# Patient Record
Sex: Male | Born: 1985 | Race: Black or African American | Hispanic: No | Marital: Single | Smoking: Never smoker
Health system: Southern US, Community
[De-identification: ages and names within clinical notes are randomized; demographics above are authoritative.]

## PROBLEM LIST (undated history)

## (undated) DIAGNOSIS — E1142 Type 2 diabetes mellitus with diabetic polyneuropathy: Secondary | ICD-10-CM

## (undated) DIAGNOSIS — F329 Major depressive disorder, single episode, unspecified: Secondary | ICD-10-CM

## (undated) DIAGNOSIS — K259 Gastric ulcer, unspecified as acute or chronic, without hemorrhage or perforation: Secondary | ICD-10-CM

## (undated) DIAGNOSIS — K920 Hematemesis: Secondary | ICD-10-CM

## (undated) DIAGNOSIS — F4310 Post-traumatic stress disorder, unspecified: Secondary | ICD-10-CM

## (undated) DIAGNOSIS — F32A Depression, unspecified: Secondary | ICD-10-CM

## (undated) DIAGNOSIS — R809 Proteinuria, unspecified: Secondary | ICD-10-CM

## (undated) DIAGNOSIS — F419 Anxiety disorder, unspecified: Secondary | ICD-10-CM

## (undated) DIAGNOSIS — E109 Type 1 diabetes mellitus without complications: Secondary | ICD-10-CM

## (undated) HISTORY — DX: Anxiety disorder, unspecified: F41.9

## (undated) HISTORY — PX: APPENDECTOMY: SHX54

## (undated) HISTORY — DX: Post-traumatic stress disorder, unspecified: F43.10

## (undated) HISTORY — DX: Depression, unspecified: F32.A

## (undated) HISTORY — DX: Major depressive disorder, single episode, unspecified: F32.9

---

## 2011-09-28 ENCOUNTER — Emergency Department (HOSPITAL_COMMUNITY)
Admission: EM | Admit: 2011-09-28 | Discharge: 2011-09-29 | Discharge status: Against Medical Advice | Payer: Medicaid - Out of State | Attending: Emergency Medicine | Admitting: Emergency Medicine

## 2011-09-28 ENCOUNTER — Emergency Department (HOSPITAL_COMMUNITY)
Admission: EM | Admit: 2011-09-28 | Discharge: 2011-09-28 | Disposition: A | Discharge status: Home / Self Care | Payer: Medicaid - Out of State | Attending: Emergency Medicine | Admitting: Emergency Medicine

## 2011-09-28 ENCOUNTER — Encounter (HOSPITAL_COMMUNITY): Payer: Self-pay | Admitting: Emergency Medicine

## 2011-09-28 DIAGNOSIS — R1110 Vomiting, unspecified: Secondary | ICD-10-CM | POA: Insufficient documentation

## 2011-09-28 DIAGNOSIS — R1012 Left upper quadrant pain: Secondary | ICD-10-CM | POA: Insufficient documentation

## 2011-09-28 DIAGNOSIS — IMO0001 Reserved for inherently not codable concepts without codable children: Secondary | ICD-10-CM | POA: Insufficient documentation

## 2011-09-28 DIAGNOSIS — E119 Type 2 diabetes mellitus without complications: Secondary | ICD-10-CM | POA: Insufficient documentation

## 2011-09-28 DIAGNOSIS — R109 Unspecified abdominal pain: Secondary | ICD-10-CM | POA: Insufficient documentation

## 2011-09-28 HISTORY — DX: Proteinuria, unspecified: R80.9

## 2011-09-28 HISTORY — DX: Gastric ulcer, unspecified as acute or chronic, without hemorrhage or perforation: K25.9

## 2011-09-28 LAB — CBC WITH DIFFERENTIAL/PLATELET
Basophils Relative: 1 % (ref 0–1)
Eosinophils Absolute: 0.1 10*3/uL (ref 0.0–0.7)
Lymphs Abs: 1.6 10*3/uL (ref 0.7–4.0)
MCH: 26.2 pg (ref 26.0–34.0)
MCHC: 33 g/dL (ref 30.0–36.0)
Neutrophils Relative %: 63 % (ref 43–77)
Platelets: 225 10*3/uL (ref 150–400)
RBC: 4.43 MIL/uL (ref 4.22–5.81)

## 2011-09-28 LAB — URINALYSIS, ROUTINE W REFLEX MICROSCOPIC
Glucose, UA: 100 mg/dL — AB
Leukocytes, UA: NEGATIVE
pH: 5.5 (ref 5.0–8.0)

## 2011-09-28 LAB — COMPREHENSIVE METABOLIC PANEL
ALT: 9 U/L (ref 0–53)
AST: 14 U/L (ref 0–37)
Albumin: 3.8 g/dL (ref 3.5–5.2)
Alkaline Phosphatase: 99 U/L (ref 39–117)
Potassium: 4.2 mEq/L (ref 3.5–5.1)
Sodium: 137 mEq/L (ref 135–145)
Total Protein: 7.6 g/dL (ref 6.0–8.3)

## 2011-09-28 NOTE — ED Notes (Signed)
[facility] ED called and reported patient is in their waiting room.

## 2011-09-28 NOTE — ED Notes (Addendum)
Pt reports having pain all over; reports pain started today in RLQ of abd, vomited, and not reports that pain is radiating to entire body; pt reports one year history of DM, and reports having difficulty with sugar levels

## 2011-09-28 NOTE — ED Notes (Signed)
Pt left AMA °

## 2011-09-28 NOTE — ED Notes (Signed)
Pt alert, arrives from home, seen in [facility] ED today, left after triage, describes pain as Sharp, non radiating, resp even unlabored, skin pwd

## 2013-06-27 ENCOUNTER — Emergency Department (HOSPITAL_COMMUNITY): Payer: Medicaid - Out of State

## 2013-06-27 ENCOUNTER — Emergency Department (HOSPITAL_COMMUNITY)
Admission: EM | Admit: 2013-06-27 | Discharge: 2013-06-27 | Disposition: A | Discharge status: Home / Self Care | Payer: Self-pay | Attending: Emergency Medicine | Admitting: Emergency Medicine

## 2013-06-27 ENCOUNTER — Emergency Department (HOSPITAL_COMMUNITY): Payer: Self-pay

## 2013-06-27 ENCOUNTER — Encounter (HOSPITAL_COMMUNITY): Payer: Self-pay | Admitting: Emergency Medicine

## 2013-06-27 DIAGNOSIS — R112 Nausea with vomiting, unspecified: Secondary | ICD-10-CM | POA: Insufficient documentation

## 2013-06-27 DIAGNOSIS — E119 Type 2 diabetes mellitus without complications: Secondary | ICD-10-CM | POA: Insufficient documentation

## 2013-06-27 DIAGNOSIS — G589 Mononeuropathy, unspecified: Secondary | ICD-10-CM | POA: Insufficient documentation

## 2013-06-27 DIAGNOSIS — Z794 Long term (current) use of insulin: Secondary | ICD-10-CM | POA: Insufficient documentation

## 2013-06-27 DIAGNOSIS — Z79899 Other long term (current) drug therapy: Secondary | ICD-10-CM | POA: Insufficient documentation

## 2013-06-27 DIAGNOSIS — Z8719 Personal history of other diseases of the digestive system: Secondary | ICD-10-CM | POA: Insufficient documentation

## 2013-06-27 DIAGNOSIS — R109 Unspecified abdominal pain: Secondary | ICD-10-CM

## 2013-06-27 DIAGNOSIS — R1084 Generalized abdominal pain: Secondary | ICD-10-CM | POA: Insufficient documentation

## 2013-06-27 LAB — CBC
HCT: 37.1 % — ABNORMAL LOW (ref 39.0–52.0)
Hemoglobin: 12.2 g/dL — ABNORMAL LOW (ref 13.0–17.0)
MCH: 27.1 pg (ref 26.0–34.0)
MCHC: 32.9 g/dL (ref 30.0–36.0)
MCV: 82.4 fL (ref 78.0–100.0)
PLATELETS: 145 10*3/uL — AB (ref 150–400)
RBC: 4.5 MIL/uL (ref 4.22–5.81)
RDW: 13.1 % (ref 11.5–15.5)
WBC: 5 10*3/uL (ref 4.0–10.5)

## 2013-06-27 LAB — COMPREHENSIVE METABOLIC PANEL
ALBUMIN: 3.9 g/dL (ref 3.5–5.2)
ALT: 12 U/L (ref 0–53)
AST: 30 U/L (ref 0–37)
Alkaline Phosphatase: 85 U/L (ref 39–117)
BILIRUBIN TOTAL: 0.4 mg/dL (ref 0.3–1.2)
BUN: 15 mg/dL (ref 6–23)
CALCIUM: 8.9 mg/dL (ref 8.4–10.5)
CHLORIDE: 103 meq/L (ref 96–112)
CO2: 25 meq/L (ref 19–32)
CREATININE: 1.1 mg/dL (ref 0.50–1.35)
GFR calc Af Amer: >90 mL/min (ref >90)
GFR, EST NON AFRICAN AMERICAN: 89 mL/min — AB (ref >90)
Glucose, Bld: 128 mg/dL — ABNORMAL HIGH (ref 70–99)
Potassium: 3.8 mEq/L (ref 3.7–5.3)
Sodium: 140 mEq/L (ref 137–147)
Total Protein: 7.1 g/dL (ref 6.0–8.3)

## 2013-06-27 LAB — LIPASE, BLOOD: Lipase: 11 U/L (ref 11–59)

## 2013-06-27 MED ORDER — HYDROMORPHONE HCL PF 1 MG/ML IJ SOLN
1.0000 mg | Freq: Once | INTRAMUSCULAR | Status: AC
  Administered 2013-06-27: 1 mg via INTRAVENOUS
  Filled 2013-06-27: qty 1

## 2013-06-27 MED ORDER — HYDROCODONE-ACETAMINOPHEN 5-325 MG PO TABS: 1.0000 | ORAL_TABLET | ORAL | Status: DC | PRN

## 2013-06-27 MED ORDER — PROMETHAZINE HCL 25 MG PO TABS: 25.0000 mg | ORAL_TABLET | Freq: Four times a day (QID) | ORAL | Status: DC | PRN

## 2013-06-27 MED ORDER — IOHEXOL 300 MG/ML  SOLN
50.0000 mL | Freq: Once | INTRAMUSCULAR | Status: AC | PRN
  Administered 2013-06-27: 50 mL via ORAL

## 2013-06-27 MED ORDER — IOHEXOL 300 MG/ML  SOLN
100.0000 mL | Freq: Once | INTRAMUSCULAR | Status: AC | PRN
  Administered 2013-06-27: 100 mL via INTRAVENOUS

## 2013-06-27 MED ORDER — ONDANSETRON HCL 4 MG/2ML IJ SOLN
4.0000 mg | Freq: Once | INTRAMUSCULAR | Status: AC
  Administered 2013-06-27: 4 mg via INTRAVENOUS
  Filled 2013-06-27: qty 2

## 2013-06-27 NOTE — ED Notes (Signed)
Patient transported to CT 

## 2013-06-27 NOTE — Discharge Instructions (Signed)
Abdominal Pain, Adult °Many things can cause abdominal pain. Usually, abdominal pain is not caused by a disease and will improve without treatment. It can often be observed and treated at home. Your health care provider will do a physical exam and possibly order blood tests and X-rays to help determine the seriousness of your pain. However, in many cases, more time must pass before a clear cause of the pain can be found. Before that point, your health care provider may not know if you need more testing or further treatment. °HOME CARE INSTRUCTIONS  °Monitor your abdominal pain for any changes. The following actions may help to alleviate any discomfort you are experiencing: °· Only take over-the-counter or prescription medicines as directed by your health care provider. °· Do not take laxatives unless directed to do so by your health care provider. °· Try a clear liquid diet (broth, tea, or water) as directed by your health care provider. Slowly move to a bland diet as tolerated. °SEEK MEDICAL CARE IF: °· You have unexplained abdominal pain. °· You have abdominal pain associated with nausea or diarrhea. °· You have pain when you urinate or have a bowel movement. °· You experience abdominal pain that wakes you in the night. °· You have abdominal pain that is worsened or improved by eating food. °· You have abdominal pain that is worsened with eating fatty foods. °SEEK IMMEDIATE MEDICAL CARE IF:  °· Your pain does not go away within 2 hours. °· You have a fever. °· You keep throwing up (vomiting). °· Your pain is felt only in portions of the abdomen, such as the right side or the left lower portion of the abdomen. °· You pass bloody or black tarry stools. °MAKE SURE YOU: °· Understand these instructions.   °· Will watch your condition.   °· Will get help right away if you are not doing well or get worse.   °Document Released: 10/20/2004 Document Revised: 10/31/2012 Document Reviewed: 09/19/2012 °ExitCare® Patient  Information ©2014 [employer]. ° °

## 2013-06-27 NOTE — ED Provider Notes (Signed)
CSN: [id number]     Arrival date & time 06/27/13  0315 History   First MD Initiated Contact with Patient 06/27/13 0324     Chief Complaint  Patient presents with  . Abdominal Pain  . Emesis     The history is provided by the patient.   patient reports developing mild left-sided upper abdominal pain throughout the evening but then acutely worsened.  He has a history of gastric ulcers before in the past.  He reports one episode of nausea and vomiting.  He reports there was a tinge of blood in the vomit.  He denies diarrhea.  No fevers or chills.  His pain in his abdomen became suddenly worse when he was moving a piece of heavy equipment at work.  No flank pain.  No urinary complaints.  No testicular or scrotal complaints  Past Medical History  Diagnosis Date  . Diabetes mellitus   . Neuropathy   . Stomach ulcer   . Proteinuria    Past Surgical History  Procedure Laterality Date  . Appendectomy     No family history on file. History  Substance Use Topics  . Smoking status: Never Smoker   . Smokeless tobacco: Not on file  . Alcohol Use: No    Review of Systems  Gastrointestinal: Positive for vomiting and abdominal pain.  All other systems reviewed and are negative.     Allergies  Banana and Ibuprofen  Home Medications   Prior to Admission medications   Medication Sig Start Date End Date Taking? Authorizing Provider  gabapentin (NEURONTIN) 300 MG capsule Take 300 mg by mouth 3 (three) times daily.   Yes Historical Provider, MD  insulin glargine (LANTUS) 100 UNIT/ML injection Inject 20 Units into the skin at bedtime.    Yes Historical Provider, MD  traMADol (ULTRAM) 50 MG tablet Take 50 mg by mouth every 6 (six) hours as needed. For pain   Yes Historical Provider, MD  HYDROcodone-acetaminophen (NORCO/VICODIN) 5-325 MG per tablet Take 1 tablet by mouth every 4 (four) hours as needed for moderate pain. 06/27/13   Lyanne CoKevin M Joandry Slagter, MD  promethazine (PHENERGAN) 25 MG tablet Take 1  tablet (25 mg total) by mouth every 6 (six) hours as needed for nausea or vomiting. 06/27/13   Lyanne CoKevin M Takeela Peil, MD   BP 117/66  Pulse 83  Temp(Src) 98.2 F (36.8 C) (Oral)  Resp 14  SpO2 98% Physical Exam  Nursing note and vitals reviewed. Constitutional: He is oriented to person, place, and time. He appears well-developed and well-nourished.  HENT:  Head: Normocephalic and atraumatic.  Eyes: EOM are normal.  Neck: Normal range of motion.  Cardiovascular: Normal rate, regular rhythm, normal heart sounds and intact distal pulses.   Pulmonary/Chest: Effort normal and breath sounds normal. No respiratory distress.  Abdominal: Soft. He exhibits no distension.  Abdominal tenderness with diffuse guarding on the left side of his abdomen  Musculoskeletal: Normal range of motion.  Neurological: He is alert and oriented to person, place, and time.  Skin: Skin is warm and dry.  Psychiatric: He has a normal mood and affect. Judgment normal.    ED Course  Procedures (including critical care time) Labs Review Labs Reviewed  CBC - Abnormal; Notable for the following:    Hemoglobin 12.2 (*)    HCT 37.1 (*)    Platelets 145 (*)    All other components within normal limits  COMPREHENSIVE METABOLIC PANEL - Abnormal; Notable for the following:    Glucose, Bld  128 (*)    GFR calc non Af Amer 89 (*)    All other components within normal limits  LIPASE, BLOOD    Imaging Review Ct Abdomen Pelvis W Contrast  06/27/2013   CLINICAL DATA:  Left upper quadrant pain, emesis.  EXAM: CT ABDOMEN AND PELVIS WITH CONTRAST  TECHNIQUE: Multidetector CT imaging of the abdomen and pelvis was performed using the standard protocol following bolus administration of intravenous contrast.  CONTRAST:  OMNIPAQUE IOHEXOL 300 MG/ML  SOLN  COMPARISON:  None.  FINDINGS: Mild subsegmental atelectasis seen dependently within the visualized lung bases.  The liver demonstrates a normal contrast enhanced appearance.  Gallbladder is decompressed but grossly normal. No biliary ductal dilatation. The spleen, adrenal glands, and pancreas demonstrate a normal contrast enhanced appearance.  Kidneys are equal size with symmetric enhancement. No nephrolithiasis, hydronephrosis, or focal enhancing renal mass.  And stomach is within normal limits.  Mildly prominent contrast filled loop of jejunum measuring up to 2.7 cm in caliber seen within the mid upper and left upper abdomen. There is mild circumferential wall thickening about this mildly dilated loop of bowel. Finding is indeterminate, and may represent underlying into right is. Distally, the small bowel is within normal limits. Suture material seen at the cecum, compatible with prior appendectomy. No other inflammatory changes seen about the bowels.  Bladder is unremarkable.  Prostate within normal limits.  No free air or fluid. No enlarged intra-abdominal pelvic lymph nodes. Normal intravascular enhancement seen throughout the abdomen and pelvis.  No acute osseous abnormality. No worrisome lytic or blastic osseous lesions.  IMPRESSION: 1. Mildly prominent contrast filled loop of jejunum with associated circumferential wall thickening within the left mid and upper abdomen. Finding is nonspecific, but may be related to enteritis. Clinical correlation recommended. 2. No other acute intra-abdominal or pelvic process identified.   Electronically Signed   By: Rise Mu M.D.   On: 06/27/2013 06:10   Dg Abd 2 Views  06/27/2013   CLINICAL DATA:  Abdominal pain and emesis.  EXAM: ABDOMEN - 2 VIEW  COMPARISON:  None.  FINDINGS: The bowel gas pattern is normal. There is no evidence of free air. No radio-opaque calculi or other significant radiographic abnormality is seen.  IMPRESSION: Negative.   Electronically Signed   By: Tiburcio Pea M.D.   On: 06/27/2013 04:10     EKG Interpretation None      MDM   Final diagnoses:  Abdominal pain    Patient feels better at  this time.  Discharge home in good condition.  CT scan without acute pathology.  This may represent abdominal wall discomfort or pain.  No urinary complaints.  Patient be sent home with a short course of Vicodin and Phenergan    Lyanne Co, MD 06/27/13 [phone]

## 2013-06-27 NOTE — ED Notes (Signed)
Bed: WA17 Expected date: 06/27/13 Expected time: 3:04 AM Means of arrival: Ambulance Comments: RT SIDED PAIN

## 2013-06-27 NOTE — ED Notes (Signed)
Patient transported to X-ray 

## 2013-06-27 NOTE — ED Notes (Signed)
Patient states he was at work and had sudden sharp pain in his left upper quadrant of his abdomen that became more intense as he continued to work. He states he had an episode of bloody emesis. Patient states he has history of ulcers and has had bloody emesis in the past. He denies any changes in eating habits. He has sharp intermitttent pains as we are discussing his history.

## 2013-07-22 ENCOUNTER — Emergency Department (HOSPITAL_COMMUNITY)
Admission: EM | Admit: 2013-07-22 | Discharge: 2013-07-22 | Disposition: A | Discharge status: Home / Self Care | Payer: Medicaid - Out of State | Attending: Emergency Medicine | Admitting: Emergency Medicine

## 2013-07-22 ENCOUNTER — Encounter (HOSPITAL_COMMUNITY): Payer: Self-pay | Admitting: Emergency Medicine

## 2013-07-22 DIAGNOSIS — Z8669 Personal history of other diseases of the nervous system and sense organs: Secondary | ICD-10-CM | POA: Insufficient documentation

## 2013-07-22 DIAGNOSIS — S3121XA Laceration without foreign body of penis, initial encounter: Secondary | ICD-10-CM

## 2013-07-22 DIAGNOSIS — E119 Type 2 diabetes mellitus without complications: Secondary | ICD-10-CM | POA: Insufficient documentation

## 2013-07-22 DIAGNOSIS — X58XXXA Exposure to other specified factors, initial encounter: Secondary | ICD-10-CM | POA: Insufficient documentation

## 2013-07-22 DIAGNOSIS — K259 Gastric ulcer, unspecified as acute or chronic, without hemorrhage or perforation: Secondary | ICD-10-CM | POA: Insufficient documentation

## 2013-07-22 DIAGNOSIS — Y929 Unspecified place or not applicable: Secondary | ICD-10-CM | POA: Insufficient documentation

## 2013-07-22 DIAGNOSIS — Z794 Long term (current) use of insulin: Secondary | ICD-10-CM | POA: Insufficient documentation

## 2013-07-22 DIAGNOSIS — Y9389 Activity, other specified: Secondary | ICD-10-CM | POA: Insufficient documentation

## 2013-07-22 DIAGNOSIS — Z79899 Other long term (current) drug therapy: Secondary | ICD-10-CM | POA: Insufficient documentation

## 2013-07-22 DIAGNOSIS — S3120XA Unspecified open wound of penis, initial encounter: Secondary | ICD-10-CM | POA: Insufficient documentation

## 2013-07-22 LAB — CBG MONITORING, ED: GLUCOSE-CAPILLARY: 49 mg/dL — AB (ref 70–99)

## 2013-07-22 MED ORDER — LIDOCAINE HCL 2 % EX GEL
Freq: Once | CUTANEOUS | Status: DC
  Filled 2013-07-22: qty 10

## 2013-07-22 NOTE — ED Notes (Signed)
Pt states that the skin has torn away from the head of his penis, pt is not circumsized

## 2013-07-22 NOTE — ED Provider Notes (Signed)
CSN: [id number]     Arrival date & time 07/22/13  0544 History   First MD Initiated Contact with Patient 07/22/13 [phone]     Chief Complaint  Patient presents with  . Penis Injury     (Consider location/radiation/quality/duration/timing/severity/associated sxs/prior Treatment) HPI 28 year old male having intercourse this morning after intercourse noticed penile bleeding at the base of his glans at the 6:00 position at the junction of his foreskin and glans bleeding only partially controlled with local pressure there is no treatment prior to arrival otherwise, he is no lightheadedness no chest pain no shortness breath abdominal pain no vomiting no other concerns. There is no significant trauma, he is no testicular pain. This occurred just prior to arrival this morning. Tetanus vaccine UTD. Past Medical History  Diagnosis Date  . Diabetes mellitus   . Neuropathy   . Stomach ulcer   . Proteinuria    Past Surgical History  Procedure Laterality Date  . Appendectomy     History reviewed. No pertinent family history. History  Substance Use Topics  . Smoking status: Never Smoker   . Smokeless tobacco: Not on file  . Alcohol Use: No    Review of Systems 10 Systems reviewed and are negative for acute change except as noted in the HPI.   Allergies  Banana and Ibuprofen  Home Medications   Prior to Admission medications   Medication Sig Start Date End Date Taking? Authorizing Provider  gabapentin (NEURONTIN) 300 MG capsule Take 300 mg by mouth 3 (three) times daily.   Yes Historical Provider, MD  insulin glargine (LANTUS) 100 UNIT/ML injection Inject 20 Units into the skin at bedtime.    Yes Historical Provider, MD  pantoprazole (PROTONIX) 40 MG tablet Take 40 mg by mouth daily.   Yes Historical Provider, MD  PRESCRIPTION MEDICATION Take 1 tablet by mouth daily. Unknown anti depressant   Yes Historical Provider, MD  promethazine (PHENERGAN) 25 MG tablet Take 1 tablet (25 mg total) by  mouth every 6 (six) hours as needed for nausea or vomiting. 06/27/13  Yes Lyanne CoKevin M Campos, MD   BP 113/66  Pulse 76  Temp(Src) 98.3 F (36.8 C) (Oral)  Resp 16  Ht 5\' 6"  (1.676 m)  Wt 150 lb (68.04 kg)  BMI 24.22 kg/m2  SpO2 99% Physical Exam  Nursing note and vitals reviewed. Constitutional:  Awake, alert, nontoxic appearance.  HENT:  Head: Atraumatic.  Eyes: Right eye exhibits no discharge. Left eye exhibits no discharge.  Neck: Neck supple.  Cardiovascular: Normal rate and regular rhythm.   No murmur heard. Pulmonary/Chest: Effort normal and breath sounds normal. No respiratory distress. He has no wheezes. He has no rales. He exhibits no tenderness.  Abdominal: Soft. Bowel sounds are normal. He exhibits no distension. There is no tenderness. There is no rebound and no guarding.  Genitourinary:  Testicles nontender; uncircumcised male; base of the glans of the junction of the foreskin at the 6:00 position as approximately 5 mm x 5 mm skin tear with active bleeding partially controlled with local pressure; no rash and no discharge to penis/foreskin  Musculoskeletal: He exhibits no tenderness.  Baseline ROM, no obvious new focal weakness.  Neurological: He is alert.  Mental status and motor strength appears baseline for patient and situation.  Skin: No rash noted.  Psychiatric: He has a normal mood and affect.    ED Course  Procedures (including critical care time) QuickClot stopped bleeding.Pt feels improved after observation and/or treatment in ED.Patient / Family /  Caregiver informed of clinical course, understand medical decision-making process, and agree with plan. Labs Review Labs Reviewed  CBG MONITORING, ED - Abnormal; Notable for the following:    Glucose-Capillary 49 (*)    All other components within normal limits    Imaging Review No results found.   EKG Interpretation None      MDM   Final diagnoses:  Laceration of penis, initial encounter    I doubt  any other EMC precluding discharge at this time including, but not necessarily limited to the following:SBI.    Hurman HornJohn M Bednar, MD 07/24/13 2157

## 2013-07-22 NOTE — Discharge Instructions (Signed)
Non-Sutured Laceration A laceration is a cut or wound that goes through all layers of the skin and into the tissue just beneath the skin. Usually, these are stitched up or held together with tape or glue shortly after the injury occurred. However, if several or more hours have passed before getting care, too many germs (bacteria) get into the laceration. Stitching it closed would bring the risk of infection. If your health care provider feels your laceration is too old, it may be left open and then bandaged to allow healing from the bottom layer up. HOME CARE INSTRUCTIONS   Change the bandage (dressing) 2 times a day or as directed by your health care provider.  If the dressing or packing gauze sticks, soak it off with soapy water.  When you re-bandage your laceration, make sure that the dressing or packing gauze goes all the way to the bottom of the laceration. The top of the laceration is kept open so it can heal from the bottom up. There is less chance for infection with this method.  Wash the area with soap and water 2 times a day to remove all the creams or ointments, if used. Rinse off the soap. Pat the area dry with a clean towel. Look for signs of infection, such as redness, swelling, or a red line that goes away from the laceration.  Re-apply creams or ointments if they were used to bandage the laceration. This helps keep the bandage from sticking.  If the bandage becomes wet, dirty, or has a bad smell, change it as soon as possible.  Only take medicine as directed by your health care provider. You might need a tetanus shot now if:  You have no idea when you had the last one.  You have never had a tetanus shot before.  Your laceration had dirt in it.  Your laceration was dirty, and your last tetanus shot was more than 7 years ago.  Your laceration was clean, and your last tetanus shot was more than 10 years ago. If you need a tetanus shot, and you decide not to get one, there is  a rare chance of getting tetanus. Sickness from tetanus can be serious. If you got a tetanus shot, your arm may swell and get red and warm to the touch at the shot site. This is common and not a problem. SEEK MEDICAL CARE IF:   You have redness, swelling, or increasing pain in the laceration.  You notice a red line that goes away from your laceration.  You have pus coming from the laceration.  You have a fever.  You notice a bad smell coming from the laceration or dressing.  You notice something coming out of the laceration, such as wood or glass.  Your laceration is on your hand or foot and you are unable to properly move a finger or toe.  You have severe swelling around the laceration, causing pain and numbness.  You notice a change in color in your arm, hand, leg, or foot.  Return sooner also if you develop uncontrolled recurrent bleeding or other concerns. MAKE SURE YOU:   Understand these instructions.  Will watch your condition.  Will get help right away if you are not doing well or get worse. Document Released: 12/08/2005 Document Revised: 01/15/2013 Document Reviewed: 06/30/2008 [facility] Patient Information 2015 [employer], [place]. This information is not intended to replace advice given to you by your health care provider. Make sure you discuss any questions you have with  your health care provider. ° °

## 2013-10-27 ENCOUNTER — Encounter (HOSPITAL_COMMUNITY): Payer: Self-pay | Admitting: Emergency Medicine

## 2013-10-27 ENCOUNTER — Emergency Department (HOSPITAL_COMMUNITY)
Admission: EM | Admit: 2013-10-27 | Discharge: 2013-10-27 | Disposition: A | Discharge status: Home / Self Care | Payer: No Typology Code available for payment source | Attending: Emergency Medicine | Admitting: Emergency Medicine

## 2013-10-27 DIAGNOSIS — Z79899 Other long term (current) drug therapy: Secondary | ICD-10-CM | POA: Insufficient documentation

## 2013-10-27 DIAGNOSIS — F419 Anxiety disorder, unspecified: Secondary | ICD-10-CM | POA: Diagnosis present

## 2013-10-27 DIAGNOSIS — Z8739 Personal history of other diseases of the musculoskeletal system and connective tissue: Secondary | ICD-10-CM | POA: Diagnosis not present

## 2013-10-27 DIAGNOSIS — E119 Type 2 diabetes mellitus without complications: Secondary | ICD-10-CM | POA: Insufficient documentation

## 2013-10-27 DIAGNOSIS — Z794 Long term (current) use of insulin: Secondary | ICD-10-CM | POA: Diagnosis not present

## 2013-10-27 DIAGNOSIS — F410 Panic disorder [episodic paroxysmal anxiety] without agoraphobia: Secondary | ICD-10-CM

## 2013-10-27 DIAGNOSIS — Z8719 Personal history of other diseases of the digestive system: Secondary | ICD-10-CM | POA: Diagnosis not present

## 2013-10-27 MED ORDER — HYDROXYZINE HCL 25 MG PO TABS: 25.0000 mg | ORAL_TABLET | Freq: Three times a day (TID) | ORAL | Status: DC | PRN
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
## 2013-10-27 NOTE — Discharge Instructions (Signed)
Panic Attacks °Panic attacks are sudden, short-lived surges of severe anxiety, fear, or discomfort. They may occur for no reason when you are relaxed, when you are anxious, or when you are sleeping. Panic attacks may occur for a number of reasons:  °· Healthy people occasionally have panic attacks in extreme, life-threatening situations, such as war or natural disasters. Normal anxiety is a protective mechanism of the body that helps us react to danger (fight or flight response). °· Panic attacks are often seen with anxiety disorders, such as panic disorder, social anxiety disorder, generalized anxiety disorder, and phobias. Anxiety disorders cause excessive or uncontrollable anxiety. They may interfere with your relationships or other life activities. °· Panic attacks are sometimes seen with other mental illnesses, such as depression and posttraumatic stress disorder. °· Certain medical conditions, prescription medicines, and drugs of abuse can cause panic attacks. °SYMPTOMS  °Panic attacks start suddenly, peak within 20 minutes, and are accompanied by four or more of the following symptoms: °· Pounding heart or fast heart rate (palpitations). °· Sweating. °· Trembling or shaking. °· Shortness of breath or feeling smothered. °· Feeling choked. °· Chest pain or discomfort. °· Nausea or strange feeling in your stomach. °· Dizziness, light-headedness, or feeling like you will faint. °· Chills or hot flushes. °· Numbness or tingling in your lips or hands and feet. °· Feeling that things are not real or feeling that you are not yourself. °· Fear of losing control or going crazy. °· Fear of dying. °Some of these symptoms can mimic serious medical conditions. For example, you may think you are having a heart attack. Although panic attacks can be very scary, they are not life threatening. °DIAGNOSIS  °Panic attacks are diagnosed through an assessment by your health care provider. Your health care provider will ask  questions about your symptoms, such as where and when they occurred. Your health care provider will also ask about your medical history and use of alcohol and drugs, including prescription medicines. Your health care provider may order blood tests or other studies to rule out a serious medical condition. Your health care provider may refer you to a mental health professional for further evaluation. °TREATMENT  °· Most healthy people who have one or two panic attacks in an extreme, life-threatening situation will not require treatment. °· The treatment for panic attacks associated with anxiety disorders or other mental illness typically involves counseling with a mental health professional, medicine, or a combination of both. Your health care provider will help determine what treatment is best for you. °· Panic attacks due to physical illness usually go away with treatment of the illness. If prescription medicine is causing panic attacks, talk with your health care provider about stopping the medicine, decreasing the dose, or substituting another medicine. °· Panic attacks due to alcohol or drug abuse go away with abstinence. Some adults need professional help in order to stop drinking or using drugs. °HOME CARE INSTRUCTIONS  °· Take all medicines as directed by your health care provider.   °· Schedule and attend follow-up visits as directed by your health care provider. It is important to keep all your appointments. °SEEK MEDICAL CARE IF: °· You are not able to take your medicines as prescribed. °· Your symptoms do not improve or get worse. °SEEK IMMEDIATE MEDICAL CARE IF:  °· You experience panic attack symptoms that are different than your usual symptoms. °· You have serious thoughts about hurting yourself or others. °· You are taking medicine for panic attacks and   have a serious side effect. °MAKE SURE YOU: °· Understand these instructions. °· Will watch your condition. °· Will get help right away if you are not  doing well or get worse. °Document Released: 01/10/2005 Document Revised: 01/15/2013 Document Reviewed: 08/24/2012 °ExitCare® Patient Information ©2015 [employer]. This information is not intended to replace advice given to you by your health care provider. Make sure you discuss any questions you have with your health care provider. ° °Emergency Department Resource Guide °1) Find a Doctor and Pay Out of Pocket °Although you won't have to find out who is covered by your insurance plan, it is a good idea to ask around and get recommendations. You will then need to call the office and see if the doctor you have chosen will accept you as a new patient and what types of options they offer for patients who are self-pay. Some doctors offer discounts or will set up payment plans for their patients who do not have insurance, but you will need to ask so you aren't surprised when you get to your appointment. ° °2) Contact Your Local Health Department °Not all health departments have doctors that can see patients for sick visits, but many do, so it is worth a call to see if yours does. If you don't know where your local health department is, you can check in your phone book. The CDC also has a tool to help you locate your state's health department, and many state websites also have listings of all of their local health departments. ° °3) Find a Walk-in Clinic °If your illness is not likely to be very severe or complicated, you may want to try a walk in clinic. These are popping up all over the country in pharmacies, drugstores, and shopping centers. They're usually staffed by nurse practitioners or physician assistants that have been trained to treat common illnesses and complaints. They're usually fairly quick and inexpensive. However, if you have serious medical issues or chronic medical problems, these are probably not your best option. ° °No Primary Care Doctor: °- Call [facility] at  832-8000 - they can help you  locate a primary care doctor that  accepts your insurance, provides certain services, etc. °- Physician Referral Service- 1-800-533-3463 ° °Chronic Pain Problems: °Organization         Address  Phone   Notes  °[facility]  (336) 297-2271 Patients need to be referred by their primary care doctor.  ° °Medication Assistance: °Organization         Address  Phone   Notes  °[place] [facility] [address] °[place], [place] [address] (336) 641-8030 --Must be a resident of [place] °-- Must have NO insurance coverage whatsoever (no Medicaid/ Medicare, etc.) °-- The pt. MUST have a primary care doctor that directs their care regularly and follows them in the community °  °[facility]  (866) 331-1348   °[employer]  (888) 892-1162   ° °Agencies that provide inexpensive medical care: °Organization         Address  Phone   Notes  °[facility]  (336) 832-8035   °[facility]    (336) 832-7272   °[facility] [address] °[place], [place] [address] (336) 832-4777   °[facility] [address], °[place] (336) 271-4999   °[facility]    (336) 373-0678   °[facility]    (336) 272-1050   °  [facility] ° [address], [place] Phone:  (336) 832-4444, Fax:  (336) 832-4440 Hours of Operation:  9 am - 6 pm, M-F.  Also accepts Medicaid/Medicare and self-pay.  °[facility] ° [address], [place] Phone: (336) 832-3150, Fax: (336) 832-3151. Hours of Operation:  8:30 am - 5:30 pm, M-F.  Also accepts Medicaid and self-pay.  °[facility] [place] [address], [place] Phone: (336) 878-6027   °[facility] [address], [place], [place] (336)723-1848, Ext. 123 Mondays & Thursdays: 7-9 AM.  First 15 patients are seen on a first come, first serve basis. °  ° °Medicaid-accepting [place]  Providers: ° °Organization         Address  Phone   Notes  °[facility] [address], [place] (336) 641-2100 Also accepts self-pay patients.  °[facility] [address], [place] ° (336) 856-9996   °[facility] [address], [place] (336) 288-8857   °[facility] [address] [place] [address], [place] (336) 299-7000   °Veita Bland [address], [place]  ° (336) 373-1557 Only accepts [place] Medicaid patients after they have their name applied to their card.  ° °Self-Pay (no insurance) in [place]: ° °Organization         Address  Phone   Notes  °Sickle Cell Patients, [facility] [address], [place] (336) 832-1970   °[facility] [address], [place] (336) 832-4400   °[facility] ° [address], [place] (336) 992-4800   °[facility]/Dr. Osei-Bonsu ° [address] [place] [address], [place] or [address], [place] (336) 841-8500 Phone number for both [place] and [place] locations is the same.  °[facility] [address], [place] (336) 299-0000   °[facility] [address] [place] [address], [place] or [address] (336) 852-7530 °(336) 878-2260   °[facility] [address], [place] (336) 350-1642, phone; (336) 294-5005, fax Sees patients 1st and 3rd Saturday of every month.  Must not qualify for public or private insurance (i.e. Medicaid, Medicare, [place] Health Choice, Veterans' Benefits) • Household income should be no more than 200% of the poverty level •The clinic cannot treat you if you are pregnant or think you are pregnant • Sexually transmitted diseases are not treated at the clinic.  ° ° °Dental Care: °Organization         Address  Phone  Notes  °[facility] [address],  (336) 641-6152 Accepts children up to age 21 who are enrolled in Medicaid or [place] Health Choice; pregnant women with a Medicaid card; and children who have applied for Medicaid or [place] Health Choice, but were declined, whose parents can pay a reduced fee at time of service.  °[facility] [place]  [address], [place] (336) 641-7733 Accepts children up to age 21 who are enrolled in Medicaid or [place] Health Choice; pregnant women with a Medicaid card; and children who have applied for Medicaid or [place] Health Choice, but were declined, whose parents can pay a reduced fee at time of service.  °[facility] ° [address], [place] (336) 641-4533 Patients are seen by appointment only. Walk-ins are not accepted. [facility] will see patients 18 years of age and older. °Monday - Tuesday (8am-5pm) °Most Wednesdays (8:30-5pm) °$30 per visit, cash only  °[facility] ° [address], [place] (336) 641-4533 Patients are seen by appointment only. Walk-ins are not accepted. [facility] will see patients 18 years of age and older. °One Wednesday Evening (Monthly: Volunteer Based).  $30 per visit, cash only  °[facility]  (919) 537-3737 for adults; Children under age 4, call Graduate Pediatric Dentistry at (919) [phone]. Children aged 4-14, please call (919) [phone] to request a pediatric application. ° Dental services are provided in all areas of dental care including fillings, crowns and bridges, complete and partial dentures, implants, gum treatment, root canals, and extractions. Preventive care is also provided. Treatment is provided to both adults and children. °Patients are selected via a lottery and there is often a waiting list. °  °[facility] [address], °[place] ° (336) 763-8833 [email or web address] °  °[facility]  [address], [place], [place] (336)723-1848, Ext. 123 Second and Fourth Thursday of each month, opens at 6:30 AM; Clinic ends at 9 AM.  Patients are seen on a first-come first-served basis, and a limited number are seen during each clinic.  ° °[facility] ° [address], [place], [place] (336) 723-7904   Eligibility Requirements °You must have lived in [place], [place], or [place] counties for at least the last three months. °  You cannot be eligible for state or federal sponsored healthcare insurance, including Veterans Administration, Medicaid, or Medicare. °  You generally cannot be eligible for healthcare insurance through your employer.  °  How to apply: °Eligibility screenings are held every Tuesday and Wednesday afternoon from 1:00 pm until 4:00 pm. You do not need an appointment for the interview!  °[facility] [address], [place], [place] 336-631-2330   °[facility]  336-342-8273   °[facility]  336-703-3100   °[facility]  336-570-6415   ° °Behavioral Health Resources in the Community: °Intensive Outpatient Programs °Organization         Address  Phone  Notes  °[facility] [address], [place], [place] 336-878-6098   °[facility] [address], [place], [place] 336-832-9800   °[facility] [address], [place], [place] ° 336-882-2125   °[facility] [address],  °[place], [place] 1-800-853-5163 or 336-641-4981   °Substance Abuse Resources °Organization         Address  Phone  Notes  °[facility]  336-882-2125   °[facility]  336-784-9470   °[facility]  336-285-9073   °[facility]  336-845-3988   °Residential & Outpatient Substance Abuse Program  1-800-659-3381   °Psychological Services °Organization         Address  Phone  Notes  °[facility]  336- 832-9600     °[facility]  336- 378-7881   °[facility] [address], [place] 1-800-853-5163 or 336-641-4981   ° °Mobile Crisis Teams °Organization         Address  Phone  Notes  °[facility], Mobile Crisis Care Unit  1-877-626-1772   °Assertive °[facility] ° [address] , [place] 336-834-9664   °  Sharon DeEsch [address] °[place] 336-554-5454   ° °Self-Help/Support Groups °Organization         Address  Phone             Notes  °[facility] [place] [facility] - variety of support groups  336- 373-1402 Call for more information  °Narcotics Anonymous (NA), [facility] [address], °[place]  2 meetings at this location  ° °Residential Treatment Programs °Organization         Address  Phone  Notes  °[facility] [address],    °[place]  1-866-801-8205   °[facility] ° [address], [place], [place] 704-293-8524   °[facility] [address], [place] 336-845-3988 Admissions: 8am-3pm M-F  °[facility] [address]    °[place], [place] 336-841-1104   °[facility] [address], [place], [place] 336-379-7146   °[facility] [address]  °[place], [place] 336-285-9073   °[facility] Intensive Outpatient [address], [place], [place] 336-852-3033   °[facility] [address]  °[place], [place] 1-877-615-2722 or 336-784-9470   °[facility] [address] [place], [place] 336-227-7417 Accepts Medicaid  °[facility] [address]  °[place] 1-800-659-3381 Substance Abuse/Addiction Treatment  ° °[place] Behavioral Health Resources °Organization         Address  Phone  Notes  °[facility]  (888) 581-9988   °Julie Brannon, PhD [address] [place], [place]   (336) 349-5553 or (336) 951-0000    °[facility]   [address] °[place], [place] (336) 349-4454   °[facility] [address], [place], [place] (336) 342-8316 Insurance/Medicaid/sponsorship through [facility]  °[facility] [address]                                    [place], [place] (336) 342-8316 Therapy/tele-psych/case  °[facility] [address]  ° [place], [place] (336) 349-2233    °Dr. Arfeen  (336) 349-4544   °[facility] [place]  [employer] [place] [facility] 1) [address], [place] °2) [address], [place] °3)  [address], [place] (336) 349-3220 °(336) 342-7768 ° °(336) 342-8140   °[place] [facility] (336) 342-1394 or (336) 342-3537 (After Hours)    ° ° ° °
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
## 2013-10-27 NOTE — ED Notes (Signed)
Pt escorted to discharge window. Pt verbalized understanding discharge instructions. In no acute distress.  

## 2013-10-27 NOTE — ED Provider Notes (Signed)
Medical screening examination/treatment/procedure(s) were performed by non-physician practitioner and as supervising physician I was immediately available for consultation/collaboration.   EKG Interpretation None       [name], MD 10/27/13 2358

## 2013-10-30 ENCOUNTER — Ambulatory Visit: Payer: Self-pay | Admitting: Family

## 2013-10-30 DIAGNOSIS — Z0289 Encounter for other administrative examinations: Secondary | ICD-10-CM

## 2013-11-20 ENCOUNTER — Encounter: Payer: Self-pay | Admitting: Medical

## 2013-11-20 ENCOUNTER — Ambulatory Visit (INDEPENDENT_AMBULATORY_CARE_PROVIDER_SITE_OTHER): Payer: No Typology Code available for payment source | Admitting: Medical

## 2013-11-20 VITALS — BP 100/70 | HR 80 | Temp 98.1°F | Resp 16 | Wt 158.0 lb

## 2013-11-20 DIAGNOSIS — E0840 Diabetes mellitus due to underlying condition with diabetic neuropathy, unspecified: Secondary | ICD-10-CM

## 2013-11-20 DIAGNOSIS — E162 Hypoglycemia, unspecified: Secondary | ICD-10-CM

## 2013-11-20 DIAGNOSIS — Z23 Encounter for immunization: Secondary | ICD-10-CM

## 2013-11-20 DIAGNOSIS — H538 Other visual disturbances: Secondary | ICD-10-CM

## 2013-11-20 LAB — COMPREHENSIVE METABOLIC PANEL
ALK PHOS: 80 U/L (ref 39–117)
ALT: 11 U/L (ref 0–53)
AST: 21 U/L (ref 0–37)
Albumin: 4.3 g/dL (ref 3.5–5.2)
BILIRUBIN TOTAL: 0.5 mg/dL (ref 0.2–1.2)
BUN: 12 mg/dL (ref 6–23)
CO2: 28 mEq/L (ref 19–32)
Calcium: 9.5 mg/dL (ref 8.4–10.5)
Chloride: 99 mEq/L (ref 96–112)
Creat: 1.07 mg/dL (ref 0.50–1.35)
Glucose, Bld: 268 mg/dL — ABNORMAL HIGH (ref 70–99)
POTASSIUM: 4 meq/L (ref 3.5–5.3)
SODIUM: 135 meq/L (ref 135–145)
TOTAL PROTEIN: 7.4 g/dL (ref 6.0–8.3)

## 2013-11-20 LAB — LIPID PANEL
Cholesterol: 161 mg/dL (ref 0–200)
HDL: 37 mg/dL — ABNORMAL LOW (ref >39)
LDL Cholesterol: 85 mg/dL (ref 0–99)
Total CHOL/HDL Ratio: 4.4 Ratio
Triglycerides: 194 mg/dL — ABNORMAL HIGH (ref <150)
VLDL: 39 mg/dL (ref 0–40)

## 2013-11-20 LAB — CBC
HCT: 40.4 % (ref 39.0–52.0)
Hemoglobin: 12.8 g/dL — ABNORMAL LOW (ref 13.0–17.0)
MCH: 26.8 pg (ref 26.0–34.0)
MCHC: 31.7 g/dL (ref 30.0–36.0)
MCV: 84.7 fL (ref 78.0–100.0)
PLATELETS: 152 10*3/uL (ref 150–400)
RBC: 4.77 MIL/uL (ref 4.22–5.81)
RDW: 13.3 % (ref 11.5–15.5)
WBC: 7.3 10*3/uL (ref 4.0–10.5)

## 2013-11-20 LAB — T4, FREE: FREE T4: 1.09 ng/dL (ref 0.80–1.80)

## 2013-11-20 LAB — TSH: TSH: 0.848 u[IU]/mL (ref 0.350–4.500)

## 2013-11-20 LAB — POCT GLYCOSYLATED HEMOGLOBIN (HGB A1C): Hemoglobin A1C: 7.6

## 2013-11-20 NOTE — Addendum Note (Signed)
Addended by: Leretha DykesSCALES, Derric Dealmeida L on: 11/20/2013 12:51 PM   Modules accepted: Orders

## 2013-11-20 NOTE — Progress Notes (Signed)
Subjective:   Tyler [name] is a 28 y.o. male presenting on 11/20/2013 with new pt. type one DM  Here as a new patient today.  [place]    Here today to establishing care.  He has hx/o diabetes, migraines, PTSD, anxiety, depression, gastric ulcer, and vision problems.    Diabetes - diagnosed in 2011, not sure if Type 1 or 2.   He was hospitalized at onset of diagnosis, says he was only there few hours, started on meal-time insulin and Levemir.  Then was just put on Levemir at some point transitioned to Lantus. In general he takes Lantus 20 units nightly, eats 1-2 times daily.  He noticed his blood sugars run on average 150s after meals, 65-ish fasting at the start of his day, but gets lows under 60 often, possibly daily, lowest number has been in the 40s.  He keeps jellybeans or low readings. He notes horrible vision, has not seen an eye doctor but denies double vision or vision loss.    Was recently hospitalized for a gastric ulcer hematemesis. Had an EGD, put on medication, doing better now, compliant with Protonix.  Had ulcers since age 28yo. Hasn't seen GI in follow up.   PTSD, anxiety, Depressoin - seeing new office Thursday appt. has a caseworker- [facility], Occupational psychologistKathy Grumblatt.  He is busy throughout the day which urgent work so sometimes only eats 1-2 times daily. Has a history of migraines. He notes no double vision, no loss of vision no prior head scan.  He says he can sense vibes or intuitions with people according to his pastor.  He works in a factory  No other complaint.  Review of Systems ROS as in subjective      Objective:    Filed Vitals:   11/20/13 1129  BP: 100/70  Pulse: 80  Temp: 98.1 F (36.7 C)  Resp: 16    General appearance: alert, no distress, WD/WN, lean AA male Oral cavity: MMM, no lesions Neck: supple, no lymphadenopathy, no thyromegaly, no masses Heart: RRR, normal S1, S2, no murmurs Lungs: CTA  bilaterally, no wheezes, rhonchi, or rales Abdomen: +bs, soft, non tender, non distended, no masses, no hepatomegaly, no splenomegaly Pulses: 2+ symmetric, upper and lower extremities, normal cap refill Ext: no edema     Assessment: Encounter Diagnoses  Name Primary?  . Diabetes mellitus due to underlying condition with diabetic neuropathy Yes  . Hypoglycemia   . Blurred vision   . Flu vaccine need      Plan: Diabetes - labs today, follow-up pending labs, may need endocrinology consult  Hypoglycemia - discussed dangers of hypoglycemia, keeping liquid glucose on hand in the event of hypoglycemia  Blurred vision-will need to see an eye doctor  Counseled on the influenza virus vaccine.  Vaccine information sheet given.  Influenza vaccine given after consent obtained.   [name] was seen today for new pt. type one dm.  Diagnoses and associated orders for this visit:  Diabetes mellitus due to underlying condition with diabetic neuropathy - Comprehensive metabolic panel - CBC - Lipid panel - TSH - T4, Free - C-peptide - Insulin, random - Microalbumin / creatinine urine ratio  Hypoglycemia - Comprehensive metabolic panel - CBC - Lipid panel - TSH - T4, Free - C-peptide - Insulin, random - Microalbumin / creatinine urine ratio  Blurred vision - Comprehensive metabolic panel - CBC - Lipid panel - TSH - T4, Free - C-peptide - Insulin, random - Microalbumin /  creatinine urine ratio  Flu vaccine need - Flu Vaccine QUAD 36+ mos PF IM (Fluarix Quad PF) - Comprehensive metabolic panel - CBC - Lipid panel - TSH - T4, Free - C-peptide - Insulin, random - Microalbumin / creatinine urine ratio    Return pending labs.

## 2013-11-21 ENCOUNTER — Encounter (HOSPITAL_COMMUNITY): Payer: Self-pay | Admitting: Psychiatry

## 2013-11-21 ENCOUNTER — Ambulatory Visit (INDEPENDENT_AMBULATORY_CARE_PROVIDER_SITE_OTHER): Payer: PRIVATE HEALTH INSURANCE | Admitting: Psychiatry

## 2013-11-21 VITALS — BP 120/64 | HR 105 | Ht 66.0 in | Wt 154.6 lb

## 2013-11-21 DIAGNOSIS — F4310 Post-traumatic stress disorder, unspecified: Secondary | ICD-10-CM

## 2013-11-21 DIAGNOSIS — F331 Major depressive disorder, recurrent, moderate: Secondary | ICD-10-CM | POA: Insufficient documentation

## 2013-11-21 DIAGNOSIS — F6381 Intermittent explosive disorder: Secondary | ICD-10-CM

## 2013-11-21 DIAGNOSIS — F411 Generalized anxiety disorder: Secondary | ICD-10-CM | POA: Insufficient documentation

## 2013-11-21 LAB — C-PEPTIDE: C-Peptide: 0.62 ng/mL — ABNORMAL LOW (ref 0.80–3.90)

## 2013-11-21 LAB — INSULIN, RANDOM: Insulin: 15.4 u[IU]/mL (ref 2.0–19.6)

## 2013-11-21 LAB — MICROALBUMIN / CREATININE URINE RATIO
CREATININE, URINE: 267.5 mg/dL
Microalb Creat Ratio: 2.6 mg/g (ref 0.0–30.0)
Microalb, Ur: 0.7 mg/dL (ref <2.0)

## 2013-11-21 MED ORDER — SERTRALINE HCL 50 MG PO TABS: 25.0000 mg | ORAL_TABLET | Freq: Every day | ORAL | Status: DC

## 2013-11-21 MED ORDER — HYDROXYZINE HCL 25 MG PO TABS: 25.0000 mg | ORAL_TABLET | Freq: Three times a day (TID) | ORAL | Status: DC | PRN
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
## 2013-11-22 ENCOUNTER — Other Ambulatory Visit: Payer: Self-pay | Admitting: Medical

## 2013-11-22 DIAGNOSIS — E0821 Diabetes mellitus due to underlying condition with diabetic nephropathy: Secondary | ICD-10-CM

## 2013-12-04 ENCOUNTER — Encounter (HOSPITAL_COMMUNITY): Payer: Self-pay | Admitting: *Deleted

## 2013-12-04 ENCOUNTER — Emergency Department (HOSPITAL_COMMUNITY)
Admission: EM | Admit: 2013-12-04 | Discharge: 2013-12-04 | Disposition: A | Discharge status: Home / Self Care | Payer: No Typology Code available for payment source | Attending: Emergency Medicine | Admitting: Emergency Medicine

## 2013-12-04 ENCOUNTER — Emergency Department (HOSPITAL_COMMUNITY): Payer: No Typology Code available for payment source

## 2013-12-04 DIAGNOSIS — S6992XA Unspecified injury of left wrist, hand and finger(s), initial encounter: Secondary | ICD-10-CM | POA: Insufficient documentation

## 2013-12-04 DIAGNOSIS — Y9289 Other specified places as the place of occurrence of the external cause: Secondary | ICD-10-CM | POA: Diagnosis not present

## 2013-12-04 DIAGNOSIS — Y998 Other external cause status: Secondary | ICD-10-CM | POA: Insufficient documentation

## 2013-12-04 DIAGNOSIS — Y9389 Activity, other specified: Secondary | ICD-10-CM | POA: Diagnosis not present

## 2013-12-04 DIAGNOSIS — F4310 Post-traumatic stress disorder, unspecified: Secondary | ICD-10-CM | POA: Insufficient documentation

## 2013-12-04 DIAGNOSIS — Z794 Long term (current) use of insulin: Secondary | ICD-10-CM | POA: Insufficient documentation

## 2013-12-04 DIAGNOSIS — G629 Polyneuropathy, unspecified: Secondary | ICD-10-CM | POA: Diagnosis not present

## 2013-12-04 DIAGNOSIS — X58XXXA Exposure to other specified factors, initial encounter: Secondary | ICD-10-CM | POA: Diagnosis not present

## 2013-12-04 DIAGNOSIS — F419 Anxiety disorder, unspecified: Secondary | ICD-10-CM | POA: Insufficient documentation

## 2013-12-04 DIAGNOSIS — E119 Type 2 diabetes mellitus without complications: Secondary | ICD-10-CM | POA: Insufficient documentation

## 2013-12-04 DIAGNOSIS — F329 Major depressive disorder, single episode, unspecified: Secondary | ICD-10-CM | POA: Insufficient documentation

## 2013-12-04 DIAGNOSIS — Z8781 Personal history of (healed) traumatic fracture: Secondary | ICD-10-CM | POA: Diagnosis not present

## 2013-12-04 DIAGNOSIS — Z79899 Other long term (current) drug therapy: Secondary | ICD-10-CM | POA: Diagnosis not present

## 2013-12-04 DIAGNOSIS — M79643 Pain in unspecified hand: Secondary | ICD-10-CM

## 2013-12-04 NOTE — ED Notes (Signed)
The pt is c/o lt hand pain.  He was sitting on the floor last pm and used his hand to get up ans when he did his hand hurt
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
## 2013-12-04 NOTE — Discharge Instructions (Signed)
Please read and follow all provided instructions.  Your diagnoses today include:  1. Hand pain    Tests performed today include:  An x-ray of your wrist - does NOT show any broken bones  Vital signs. See below for your results today.   Medications prescribed:   Tylenol - use over-the-counter as directed on packaging  Take any prescribed medications only as directed.  Home care instructions:   Follow any educational materials contained in this packet  Wear your splint for at least one week or until seen by a physician for a follow-up examination.  Follow R.I.C.E. Protocol:  R - rest your injury   I  - use ice on injury without applying directly to skin  C - compress injury with bandage or splint  E - elevate the injury above the level of your heart as much as possible to reduce pain and swelling  Follow-up instructions: Please follow-up with your primary care provider or the provided orthopedic (bone specialist) if you continue to have significant pain or trouble using your wrist in 1 week. In this case you may have a severe injury that requires further care.   Return instructions:   Please return if your fingers are numb or tingling, appear very red, white, gray or blue, or you have severe pain (also elevate wrist and loosen splint or wrap)  Please return if you have difficulty moving your fingers.  Please return to the Emergency Department if you experience worsening symptoms.   Please return if you have any other emergent concerns.  Additional Information:  Your vital signs today were: BP 120/80 mmHg   Pulse 83   Temp(Src) 98.1 F (36.7 C)   Resp 16   Ht 5\' 6"  (1.676 m)   Wt 155 lb (70.308 kg)   BMI 25.03 kg/m2   SpO2 95% If your blood pressure (BP) was elevated above 135/85 this visit, please have this repeated by your doctor within one month. -------------- Wrist injuries are frequent in adults and children. A sprain is an injury to the ligaments that hold  your bones together. A strain is an injury to muscle or muscle tendons (cord like structure) from stretching or pulling.   Remember the importance of follow-up and possible follow-up x-rays. Improvement in pain level is not 100% insurance of not having a fracture.

## 2014-01-02 ENCOUNTER — Emergency Department (HOSPITAL_COMMUNITY): Payer: PRIVATE HEALTH INSURANCE

## 2014-01-02 ENCOUNTER — Encounter (HOSPITAL_COMMUNITY): Payer: Self-pay | Admitting: Family Medicine

## 2014-01-02 ENCOUNTER — Inpatient Hospital Stay (HOSPITAL_COMMUNITY)
Admission: EM | Admit: 2014-01-02 | Discharge: 2014-01-03 | DRG: 379 | Disposition: A | Discharge status: Home / Self Care | Payer: PRIVATE HEALTH INSURANCE | Attending: Family Medicine | Admitting: Family Medicine

## 2014-01-02 DIAGNOSIS — Z888 Allergy status to other drugs, medicaments and biological substances status: Secondary | ICD-10-CM | POA: Diagnosis not present

## 2014-01-02 DIAGNOSIS — G629 Polyneuropathy, unspecified: Secondary | ICD-10-CM | POA: Diagnosis present

## 2014-01-02 DIAGNOSIS — F4310 Post-traumatic stress disorder, unspecified: Secondary | ICD-10-CM | POA: Diagnosis present

## 2014-01-02 DIAGNOSIS — K274 Chronic or unspecified peptic ulcer, site unspecified, with hemorrhage: Principal | ICD-10-CM | POA: Diagnosis present

## 2014-01-02 DIAGNOSIS — K922 Gastrointestinal hemorrhage, unspecified: Secondary | ICD-10-CM | POA: Insufficient documentation

## 2014-01-02 DIAGNOSIS — K920 Hematemesis: Secondary | ICD-10-CM | POA: Diagnosis present

## 2014-01-02 DIAGNOSIS — R1084 Generalized abdominal pain: Secondary | ICD-10-CM | POA: Insufficient documentation

## 2014-01-02 DIAGNOSIS — F419 Anxiety disorder, unspecified: Secondary | ICD-10-CM | POA: Diagnosis present

## 2014-01-02 DIAGNOSIS — F329 Major depressive disorder, single episode, unspecified: Secondary | ICD-10-CM | POA: Diagnosis present

## 2014-01-02 DIAGNOSIS — E119 Type 2 diabetes mellitus without complications: Secondary | ICD-10-CM | POA: Diagnosis present

## 2014-01-02 DIAGNOSIS — R109 Unspecified abdominal pain: Secondary | ICD-10-CM

## 2014-01-02 DIAGNOSIS — F411 Generalized anxiety disorder: Secondary | ICD-10-CM

## 2014-01-02 HISTORY — DX: Type 2 diabetes mellitus with diabetic polyneuropathy: E11.42

## 2014-01-02 HISTORY — DX: Hematemesis: K92.0

## 2014-01-02 HISTORY — DX: Type 1 diabetes mellitus without complications: E10.9

## 2014-01-02 LAB — CBC WITH DIFFERENTIAL/PLATELET
Basophils Absolute: 0 10*3/uL (ref 0.0–0.1)
Basophils Relative: 0 % (ref 0–1)
EOS ABS: 0 10*3/uL (ref 0.0–0.7)
EOS PCT: 0 % (ref 0–5)
HCT: 37.7 % — ABNORMAL LOW (ref 39.0–52.0)
Hemoglobin: 12.6 g/dL — ABNORMAL LOW (ref 13.0–17.0)
LYMPHS ABS: 1 10*3/uL (ref 0.7–4.0)
Lymphocytes Relative: 22 % (ref 12–46)
MCH: 27.4 pg (ref 26.0–34.0)
MCHC: 33.4 g/dL (ref 30.0–36.0)
MCV: 82 fL (ref 78.0–100.0)
MONOS PCT: 8 % (ref 3–12)
Monocytes Absolute: 0.4 10*3/uL (ref 0.1–1.0)
Neutro Abs: 3.2 10*3/uL (ref 1.7–7.7)
Neutrophils Relative %: 70 % (ref 43–77)
PLATELETS: 152 10*3/uL (ref 150–400)
RBC: 4.6 MIL/uL (ref 4.22–5.81)
RDW: 12.8 % (ref 11.5–15.5)
WBC: 4.6 10*3/uL (ref 4.0–10.5)

## 2014-01-02 LAB — URINALYSIS, ROUTINE W REFLEX MICROSCOPIC
BILIRUBIN URINE: NEGATIVE
Glucose, UA: >1000 mg/dL — AB
Hgb urine dipstick: NEGATIVE
KETONES UR: NEGATIVE mg/dL
Leukocytes, UA: NEGATIVE
NITRITE: NEGATIVE
Protein, ur: NEGATIVE mg/dL
Specific Gravity, Urine: 1.024 (ref 1.005–1.030)
UROBILINOGEN UA: 1 mg/dL (ref 0.0–1.0)
pH: 6 (ref 5.0–8.0)

## 2014-01-02 LAB — CBC
HCT: 37.7 % — ABNORMAL LOW (ref 39.0–52.0)
Hemoglobin: 12.6 g/dL — ABNORMAL LOW (ref 13.0–17.0)
MCH: 26.8 pg (ref 26.0–34.0)
MCHC: 33.4 g/dL (ref 30.0–36.0)
MCV: 80 fL (ref 78.0–100.0)
PLATELETS: 143 10*3/uL — AB (ref 150–400)
RBC: 4.71 MIL/uL (ref 4.22–5.81)
RDW: 12.7 % (ref 11.5–15.5)
WBC: 4.9 10*3/uL (ref 4.0–10.5)

## 2014-01-02 LAB — COMPREHENSIVE METABOLIC PANEL
ALT: 11 U/L (ref 0–53)
ANION GAP: 14 (ref 5–15)
AST: 27 U/L (ref 0–37)
Albumin: 3.8 g/dL (ref 3.5–5.2)
Alkaline Phosphatase: 81 U/L (ref 39–117)
BUN: 19 mg/dL (ref 6–23)
CALCIUM: 9.1 mg/dL (ref 8.4–10.5)
CO2: 24 mEq/L (ref 19–32)
Chloride: 99 mEq/L (ref 96–112)
Creatinine, Ser: 1.07 mg/dL (ref 0.50–1.35)
GFR calc Af Amer: >90 mL/min (ref >90)
GFR calc non Af Amer: >90 mL/min (ref >90)
Glucose, Bld: 181 mg/dL — ABNORMAL HIGH (ref 70–99)
Potassium: 4.3 mEq/L (ref 3.7–5.3)
SODIUM: 137 meq/L (ref 137–147)
TOTAL PROTEIN: 7.3 g/dL (ref 6.0–8.3)
Total Bilirubin: 0.5 mg/dL (ref 0.3–1.2)

## 2014-01-02 LAB — TYPE AND SCREEN
ABO/RH(D): O POS
Antibody Screen: NEGATIVE

## 2014-01-02 LAB — URINE MICROSCOPIC-ADD ON

## 2014-01-02 LAB — GLUCOSE, CAPILLARY
Glucose-Capillary: 116 mg/dL — ABNORMAL HIGH (ref 70–99)
Glucose-Capillary: 75 mg/dL (ref 70–99)

## 2014-01-02 LAB — ABO/RH: ABO/RH(D): O POS

## 2014-01-02 LAB — LIPASE, BLOOD: Lipase: 12 U/L (ref 11–59)

## 2014-01-02 LAB — I-STAT TROPONIN, ED: TROPONIN I, POC: 0 ng/mL (ref 0.00–0.08)

## 2014-01-02 MED ORDER — IOHEXOL 300 MG/ML  SOLN: 25.0000 mL | Freq: Once | INTRAMUSCULAR | Status: DC | PRN

## 2014-01-02 MED ORDER — HYDROMORPHONE HCL 1 MG/ML IJ SOLN
1.0000 mg | Freq: Once | INTRAMUSCULAR | Status: AC
Start: 2014-01-02 — End: 2014-01-02
  Administered 2014-01-02: 1 mg via INTRAVENOUS
  Filled 2014-01-02: qty 1

## 2014-01-02 MED ORDER — SODIUM CHLORIDE 0.9 % IV SOLN: INTRAVENOUS | Status: DC

## 2014-01-02 MED ORDER — HYDROMORPHONE HCL 1 MG/ML IJ SOLN
0.5000 mg | INTRAMUSCULAR | Status: DC | PRN
Start: 2014-01-02 — End: 2014-01-03
  Administered 2014-01-03: 0.5 mg via INTRAVENOUS
  Filled 2014-01-02: qty 1

## 2014-01-02 MED ORDER — IOHEXOL 300 MG/ML  SOLN
80.0000 mL | Freq: Once | INTRAMUSCULAR | Status: AC | PRN
  Administered 2014-01-02: 80 mL via INTRAVENOUS

## 2014-01-02 MED ORDER — SODIUM CHLORIDE 0.9 % IV BOLUS (SEPSIS)
1000.0000 mL | Freq: Once | INTRAVENOUS | Status: AC
  Administered 2014-01-02: 1000 mL via INTRAVENOUS

## 2014-01-02 MED ORDER — SODIUM CHLORIDE 0.9 % IJ SOLN
3.0000 mL | Freq: Two times a day (BID) | INTRAMUSCULAR | Status: DC
  Administered 2014-01-02 – 2014-01-03 (×2): 3 mL via INTRAVENOUS

## 2014-01-02 MED ORDER — INSULIN GLARGINE 100 UNIT/ML ~~LOC~~ SOLN
8.0000 [IU] | Freq: Every day | SUBCUTANEOUS | Status: DC
  Administered 2014-01-02: 8 [IU] via SUBCUTANEOUS
  Filled 2014-01-02 (×2): qty 0.08

## 2014-01-02 MED ORDER — PANTOPRAZOLE SODIUM 40 MG IV SOLR
40.0000 mg | Freq: Two times a day (BID) | INTRAVENOUS | Status: DC
  Administered 2014-01-02 – 2014-01-03 (×2): 40 mg via INTRAVENOUS
  Filled 2014-01-02 (×3): qty 40

## 2014-01-02 MED ORDER — ONDANSETRON HCL 4 MG/2ML IJ SOLN
4.0000 mg | Freq: Once | INTRAMUSCULAR | Status: AC
  Administered 2014-01-02: 4 mg via INTRAVENOUS
  Filled 2014-01-02: qty 2

## 2014-01-02 MED ORDER — SODIUM CHLORIDE 0.9 % IV SOLN
INTRAVENOUS | Status: DC
Start: 2014-01-02 — End: 2014-01-02
  Administered 2014-01-02: 17:00:00 via INTRAVENOUS

## 2014-01-02 MED ORDER — INSULIN ASPART 100 UNIT/ML ~~LOC~~ SOLN: 0.0000 [IU] | Freq: Every day | SUBCUTANEOUS | Status: DC

## 2014-01-02 MED ORDER — INSULIN ASPART 100 UNIT/ML ~~LOC~~ SOLN: 0.0000 [IU] | SUBCUTANEOUS | Status: DC

## 2014-01-02 MED ORDER — FENTANYL CITRATE 0.05 MG/ML IJ SOLN
50.0000 ug | Freq: Once | INTRAMUSCULAR | Status: AC
  Administered 2014-01-02: 50 ug via INTRAVENOUS
  Filled 2014-01-02: qty 2

## 2014-01-02 MED ORDER — HYDROMORPHONE HCL 1 MG/ML IJ SOLN: 1.0000 mg | INTRAMUSCULAR | Status: DC | PRN

## 2014-01-02 MED ORDER — INSULIN ASPART 100 UNIT/ML ~~LOC~~ SOLN: 0.0000 [IU] | Freq: Three times a day (TID) | SUBCUTANEOUS | Status: DC

## 2014-01-02 MED ORDER — DEXTROSE-NACL 5-0.45 % IV SOLN
INTRAVENOUS | Status: DC
  Administered 2014-01-02: 23:00:00 via INTRAVENOUS

## 2014-01-02 MED ORDER — PANTOPRAZOLE SODIUM 40 MG IV SOLR
40.0000 mg | Freq: Once | INTRAVENOUS | Status: AC
  Administered 2014-01-02: 40 mg via INTRAVENOUS
  Filled 2014-01-02: qty 40

## 2014-01-02 MED ORDER — SODIUM CHLORIDE 0.9 % IV SOLN
INTRAVENOUS | Status: DC
  Administered 2014-01-02: 15:00:00 via INTRAVENOUS

## 2014-01-02 MED ORDER — LIDOCAINE-EPINEPHRINE 1 %-1:100000 IJ SOLN: 10.0000 mL | Freq: Once | INTRAMUSCULAR | Status: DC

## 2014-01-02 NOTE — H&P (Signed)
Family Medicine Teaching [facility] Admission History and Physical Service Pager: [phone]  Patient name: Tyler [name] Medical record number: [id number] Date of birth: Aug 21, 1985 Age: 28 y.o. Gender: male  Primary Care Provider: Crosby Oysteravid Tysinger, [facility] Consultants: GI in the morning Code Status: Full  Chief Complaint: Hematemesis  Assessment and Plan: Tyler [name] is a 28 y.o. male presenting with hematemesis . PMH is significant for history of GI bleed, T1DM, anxiety, depression, PTSD.   Hematemesis/Abdominal pain. Likely due to known peptic ulcer disease. Currently hemodyanamically stable. Hgb stable from one month ago. Presentation not consistent with Mallory-Weiss or Boerhaave Syndrome given lack of extensive vomiting. CT normal with no evidence for pancreatitis or hepatic/gall bladder pathology, and no free air making perforation unlikely. No lab evidence of infection, pancreatitis, or gall bladder pathology.  -GI consult in the morning -Obtain records from [facility] - will be highly useful to see what work up he's had previously -Protonix IV bid -zofran prn nausea, dilaudid prn pain -Serial H/H through the night -f/u Gastroccult -f/u FOBT -f/u AM CBC -monitor for worsening abdominal pain  T1DM. A1c 7.6. Home regimen: 15U lantus daily. -Half home lantus to 8U dose while NPO -SSI -monitor CBGs q4 hours while NPO  Anxiety/Depression/PTSD.  -Holding home gabapentin, hydroxyzine, and zoloft while NPO - restart when able to take PO  FEN/GI: NPO, NS@100cc /hr Prophylaxis: SCDs, no pharmacologic prophylaxis given GI bleed  Disposition: Admitted to telemetry under attending Dr Gwendolyn GrantWalden pending above management.   History of Present Illness: Tyler [name] is a 28 y.o. male presenting with hematemesis earlier today.   Patient states that the episode occurred around 11:00 am this morning after arguing with his fiancee. Says that he "felt a pop" in his  stomach then noticed significant upper abdominal pain and subsequently had 3 episodes of hematemesis. Pain is described as sharp and burning and radiates into his back. Patient reports that the vomitus was essentially pure blood with no food particles. Patient reports that he has had a few more episodes since, however has swallowed his vomitus. Has diarrhea at baseline with no frank hematochezia. Patient was in his normal state of health this morning prior to the episode. Patient denies any ingestions, NSAID use, or anticoagulant use. States this was not associated with eating.   Patient reports that he had a similar episode in September after a heated argument at work. He was managed [facility] and had an EGD performed, though the patient does not remember the exact results of the scope. He does mention possible ulcer and states he was told this was healed at that time. Patient has not followed up with a GI doctor since. Patient reports that he was diagnosed with ulcers when he was 13. Patient states that these ulcers were due to stress.  In the ED, the patient was given protonix, zofran, fentanyl, and dilaudid. Patient apparently presented with red blood in and around his mouth. Work up was remarkable for mild normocytic anemia (HgB 12.6). CT abdomen was performed which was unremarkable.   Review Of Systems: Per HPI, Otherwise 12 point review of systems was performed and was unremarkable.  Patient Active Problem List   Diagnosis Date Noted  . GI bleed 01/02/2014  . Intermittent explosive disorder 11/21/2013  . GAD (generalized anxiety disorder) 11/21/2013  . Major depressive disorder, recurrent episode, moderate 11/21/2013  . PTSD (post-traumatic stress disorder) 11/21/2013   Past Medical History: Past Medical History  Diagnosis Date  . Diabetes mellitus   .  Neuropathy   . Stomach ulcer   . Proteinuria   . Anxiety   . Depression   . PTSD (post-traumatic stress disorder)    Past  Surgical History: Past Surgical History  Procedure Laterality Date  . Appendectomy     Social History: History  Substance Use Topics  . Smoking status: Never Smoker   . Smokeless tobacco: Never Used  . Alcohol Use: No   Additional social history: N/a Please also refer to relevant sections of EMR.  Family History: Family History  Problem Relation Age of Onset  . HIV/AIDS Father   . Drug abuse Father   . Suicidality Neg Hx   . Bipolar disorder Neg Hx   . Depression Neg Hx   . Anxiety disorder Neg Hx   . Drug abuse Sister    Allergies and Medications: Allergies  Allergen Reactions  . Banana Anaphylaxis  . Ibuprofen Other (See Comments)    ulcers   No current facility-administered medications on file prior to encounter.   Current Outpatient Prescriptions on File Prior to Encounter  Medication Sig Dispense Refill  . gabapentin (NEURONTIN) 300 MG capsule Take 300 mg by mouth 3 (three) times daily.    . hydrOXYzine (ATARAX/VISTARIL) 25 MG tablet Take 1 tablet (25 mg total) by mouth every 8 (eight) hours as needed for anxiety. 90 tablet 1  . insulin glargine (LANTUS) 100 UNIT/ML injection Inject 15 Units into the skin at bedtime.     . pantoprazole (PROTONIX) 40 MG tablet Take 40 mg by mouth 2 (two) times daily.     . sertraline (ZOLOFT) 50 MG tablet Take 0.5 tablets (25 mg total) by mouth daily. 30 tablet 1    Objective: BP 132/66 mmHg  Pulse 74  Temp(Src) 98.5 F (36.9 C) (Oral)  Resp 11  SpO2 99% Exam: General: Ill appearing man lying in hospital bed HEENT: NCAT, PERRL, EOMI, MMM Cardiovascular: RRR, split s2, no murmurs Respiratory: NWOB, CTAB, no wheezes Abdomen: +BS, soft, diffusely tender to palpation most significantly in epigastric area and RUQ, nondistended, mild guarding, no rebound, negative Murphy's sign Extremities: No cyanosis or edema, distal pulses 2+ Skin: No obvious rashes or areas of skin breakdown Neuro: Drowsy, though alert and cooperative. No  gross focal neurologic deficits.   Labs and Imaging: CBC BMET   Recent Labs Lab 01/02/14 1226  WBC 4.6  HGB 12.6*  HCT 37.7*  PLT 152    Recent Labs Lab 01/02/14 1226  NA 137  K 4.3  CL 99  CO2 24  BUN 19  CREATININE 1.07  GLUCOSE 181*  CALCIUM 9.1     Urinalysis    Component Value Date/Time   COLORURINE YELLOW 01/02/2014 1340   APPEARANCEUR CLEAR 01/02/2014 1340   LABSPEC 1.024 01/02/2014 1340   PHURINE 6.0 01/02/2014 1340   GLUCOSEU >1000* 01/02/2014 1340   HGBUR NEGATIVE 01/02/2014 1340   BILIRUBINUR NEGATIVE 01/02/2014 1340   KETONESUR NEGATIVE 01/02/2014 1340   PROTEINUR NEGATIVE 01/02/2014 1340   UROBILINOGEN 1.0 01/02/2014 1340   NITRITE NEGATIVE 01/02/2014 1340   LEUKOCYTESUR NEGATIVE 01/02/2014 1340    Ct Abdomen Pelvis W Contrast  01/02/2014   CLINICAL DATA:  Hematemesis and abdomen pain CT starting today at 11 a.m. Patient has a history of diabetes and ulcers.  EXAM: CT ABDOMEN AND PELVIS WITH CONTRAST  TECHNIQUE: Multidetector CT imaging of the abdomen and pelvis was performed using the standard protocol following bolus administration of intravenous contrast.  CONTRAST:  80mL OMNIPAQUE IOHEXOL 300 MG/ML  SOLN  COMPARISON:  October 15, 2013  FINDINGS: The liver, spleen, pancreas, gallbladder, adrenal glands and kidneys are normal. There is no hydronephrosis bilaterally. The aorta is normal. There is no abdominal lymphadenopathy. There is no free air. There is no small bowel obstruction or diverticulitis. There is no inflammation around the stomach. The patient is status post prior appendectomy.  Images of the pelvis demonstrate mild diffuse thickened wall bladder unchanged. Pelvic phleboliths are identified. There is no pelvic lymphadenopathy. Mild dependent atelectasis of posterior lungs are noted. No acute abnormality is identified within the visualized bones.  IMPRESSION: No free air or inflammation surrounding the stomach. There is no bowel obstruction.    Electronically Signed   By: Sherian Rein M.D.   On: 01/02/2014 16:08    Jacquiline Doe, MD 01/02/2014, 5:11 PM PGY-1, [facility] Family Medicine FPTS Intern pager: [phone], text pages welcome  Upper Level Addendum:  I have seen and evaluated this patient along with Dr. Jimmey Ralph and reviewed the above note, making necessary revisions in red.   Marikay Alar, MD Family Medicine PGY-2

## 2014-01-02 NOTE — Progress Notes (Signed)
**  Interval Note**  RN paged.  Patient's CBG 75.  Patient is a T1DM here for hematemesis and is NPO.  Will change IVF to D5 1/2NS.  Advised RN to continue with 8 units of Lantus tonight (this is 1/2 of patient's normal regimen).  Will continue to monitor CBGs.  Asked Ms Larita FifeLynn, RN to check in 2 hours vs the scheduled q4.    Camauri Fleece M. Nadine CountsGottschalk, DO PGY-1, [facility] 01/02/14, 10:56pm

## 2014-01-02 NOTE — ED Provider Notes (Addendum)
CSN: [id number]     Arrival date & time 01/02/14  1154 History   First MD Initiated Contact with Patient 01/02/14 1214     Chief Complaint  Patient presents with  . Hematemesis  . Abdominal Pain     (Consider location/radiation/quality/duration/timing/severity/associated sxs/prior Treatment) Patient is a 28 y.o. male presenting with abdominal pain. The history is provided by the patient.  Abdominal Pain Associated symptoms: nausea and vomiting   Associated symptoms: no chest pain, no dysuria, no fever and no shortness of breath    patient with acute onset of right-sided abdominal pain and vomiting bright red blood 3 at 11:00 this morning. Patient had similar episode back in September and was admitted at [facility]. Did not require blood transfusion. Patient seems to relate to having an upper endoscopy done at that time. Is not able to give with the findings were. Patient's of bowel movement earlier today without evidence of any blood or black stools. Patient states abdominal pain is 10 out of 10. It's mostly right sided sharp in nature radiates to the back.  Past Medical History  Diagnosis Date  . Diabetes mellitus   . Neuropathy   . Stomach ulcer   . Proteinuria   . Anxiety   . Depression   . PTSD (post-traumatic stress disorder)    Past Surgical History  Procedure Laterality Date  . Appendectomy     Family History  Problem Relation Age of Onset  . HIV/AIDS Father   . Drug abuse Father   . Suicidality Neg Hx   . Bipolar disorder Neg Hx   . Depression Neg Hx   . Anxiety disorder Neg Hx   . Drug abuse Sister    History  Substance Use Topics  . Smoking status: Never Smoker   . Smokeless tobacco: Never Used  . Alcohol Use: No    Review of Systems  Constitutional: Negative for fever.  HENT: Negative for congestion.   Eyes: Negative for redness.  Respiratory: Negative for shortness of breath.   Cardiovascular: Negative for chest pain.  Gastrointestinal:  Positive for nausea, vomiting and abdominal pain. Negative for abdominal distention.  Genitourinary: Negative for dysuria.  Musculoskeletal: Positive for back pain.  Skin: Negative for rash.  Neurological: Negative for headaches.  Hematological: Does not bruise/bleed easily.  Psychiatric/Behavioral: Negative for confusion.      Allergies  Banana and Ibuprofen  Home Medications   Prior to Admission medications   Medication Sig Start Date End Date Taking? Authorizing Provider  gabapentin (NEURONTIN) 300 MG capsule Take 300 mg by mouth 3 (three) times daily.   Yes Historical Provider, MD  hydrOXYzine (ATARAX/VISTARIL) 25 MG tablet Take 1 tablet (25 mg total) by mouth every 8 (eight) hours as needed for anxiety. 11/21/13  Yes Oletta Darter, MD  insulin glargine (LANTUS) 100 UNIT/ML injection Inject 15 Units into the skin at bedtime.    Yes Historical Provider, MD  pantoprazole (PROTONIX) 40 MG tablet Take 40 mg by mouth 2 (two) times daily.    Yes Historical Provider, MD  sertraline (ZOLOFT) 50 MG tablet Take 0.5 tablets (25 mg total) by mouth daily. 11/21/13  Yes Oletta Darter, MD   BP 116/67 mmHg  Pulse 78  Temp(Src) 98.5 F (36.9 C) (Oral)  Resp 9  SpO2 97% Physical Exam  Constitutional: He is oriented to person, place, and time. He appears well-developed and well-nourished.  HENT:  Head: Normocephalic and atraumatic.  Mouth/Throat: Oropharynx is clear and moist.  Eyes: Conjunctivae and EOM  are normal. Pupils are equal, round, and reactive to light.  Neck: Normal range of motion.  Cardiovascular: Normal rate, regular rhythm and normal heart sounds.   Pulmonary/Chest: Effort normal and breath sounds normal. No respiratory distress.  Abdominal: Soft. Bowel sounds are normal. There is tenderness.  Musculoskeletal: Normal range of motion. He exhibits no edema.  Neurological: He is alert and oriented to person, place, and time. No cranial nerve deficit. He exhibits normal muscle  tone. Coordination normal.  Skin: Skin is warm. No rash noted.  Nursing note and vitals reviewed.   ED Course  Procedures (including critical care time) Labs Review Labs Reviewed  CBC WITH DIFFERENTIAL - Abnormal; Notable for the following:    Hemoglobin 12.6 (*)    HCT 37.7 (*)    All other components within normal limits  COMPREHENSIVE METABOLIC PANEL - Abnormal; Notable for the following:    Glucose, Bld 181 (*)    All other components within normal limits  URINALYSIS, ROUTINE W REFLEX MICROSCOPIC - Abnormal; Notable for the following:    Glucose, UA >1000 (*)    All other components within normal limits  LIPASE, BLOOD  URINE MICROSCOPIC-ADD ON  I-STAT TROPOININ, ED  TYPE AND SCREEN  ABO/RH   Results for orders placed or performed during the hospital encounter of 01/02/14  CBC with Differential  Result Value Ref Range   WBC 4.6 4.0 - 10.5 K/uL   RBC 4.60 4.22 - 5.81 MIL/uL   Hemoglobin 12.6 (L) 13.0 - 17.0 g/dL   HCT 16.137.7 (L) 09.639.0 - 04.552.0 %   MCV 82.0 78.0 - 100.0 fL   MCH 27.4 26.0 - 34.0 pg   MCHC 33.4 30.0 - 36.0 g/dL   RDW 40.912.8 81.111.5 - 91.415.5 %   Platelets 152 150 - 400 K/uL   Neutrophils Relative % 70 43 - 77 %   Neutro Abs 3.2 1.7 - 7.7 K/uL   Lymphocytes Relative 22 12 - 46 %   Lymphs Abs 1.0 0.7 - 4.0 K/uL   Monocytes Relative 8 3 - 12 %   Monocytes Absolute 0.4 0.1 - 1.0 K/uL   Eosinophils Relative 0 0 - 5 %   Eosinophils Absolute 0.0 0.0 - 0.7 K/uL   Basophils Relative 0 0 - 1 %   Basophils Absolute 0.0 0.0 - 0.1 K/uL  Comprehensive metabolic panel  Result Value Ref Range   Sodium 137 137 - 147 mEq/L   Potassium 4.3 3.7 - 5.3 mEq/L   Chloride 99 96 - 112 mEq/L   CO2 24 19 - 32 mEq/L   Glucose, Bld 181 (H) 70 - 99 mg/dL   BUN 19 6 - 23 mg/dL   Creatinine, Ser 7.821.07 0.50 - 1.35 mg/dL   Calcium 9.1 8.4 - 95.610.5 mg/dL   Total Protein 7.3 6.0 - 8.3 g/dL   Albumin 3.8 3.5 - 5.2 g/dL   AST 27 0 - 37 U/L   ALT 11 0 - 53 U/L   Alkaline Phosphatase 81 39 - 117  U/L   Total Bilirubin 0.5 0.3 - 1.2 mg/dL   GFR calc non Af Amer >90 >90 mL/min   GFR calc Af Amer >90 >90 mL/min   Anion gap 14 5 - 15  Lipase, blood  Result Value Ref Range   Lipase 12 11 - 59 U/L  Urinalysis, Routine w reflex microscopic  Result Value Ref Range   Color, Urine YELLOW YELLOW   APPearance CLEAR CLEAR   Specific Gravity, Urine 1.024 1.005 -  1.030   pH 6.0 5.0 - 8.0   Glucose, UA >1000 (A) NEGATIVE mg/dL   Hgb urine dipstick NEGATIVE NEGATIVE   Bilirubin Urine NEGATIVE NEGATIVE   Ketones, ur NEGATIVE NEGATIVE mg/dL   Protein, ur NEGATIVE NEGATIVE mg/dL   Urobilinogen, UA 1.0 0.0 - 1.0 mg/dL   Nitrite NEGATIVE NEGATIVE   Leukocytes, UA NEGATIVE NEGATIVE  Urine microscopic-add on  Result Value Ref Range   Squamous Epithelial / LPF RARE RARE   WBC, UA 3-6 <3 WBC/hpf   Bacteria, UA RARE RARE  I-stat troponin, ED (only if pt is 30 y.o. or older & pain is above umbilicus) - do not order at [facility]  Result Value Ref Range   Troponin i, poc 0.00 0.00 - 0.08 ng/mL   Comment 3          Type and screen  Result Value Ref Range   ABO/RH(D) O POS    Antibody Screen NEG    Sample Expiration 01/05/2014   ABO/Rh  Result Value Ref Range   ABO/RH(D) O POS      Imaging Review Ct Abdomen Pelvis W Contrast  01/02/2014   CLINICAL DATA:  Hematemesis and abdomen pain CT starting today at 11 a.m. Patient has a history of diabetes and ulcers.  EXAM: CT ABDOMEN AND PELVIS WITH CONTRAST  TECHNIQUE: Multidetector CT imaging of the abdomen and pelvis was performed using the standard protocol following bolus administration of intravenous contrast.  CONTRAST:  80mL OMNIPAQUE IOHEXOL 300 MG/ML  SOLN  COMPARISON:  October 15, 2013  FINDINGS: The liver, spleen, pancreas, gallbladder, adrenal glands and kidneys are normal. There is no hydronephrosis bilaterally. The aorta is normal. There is no abdominal lymphadenopathy. There is no free air. There is no small bowel obstruction or  diverticulitis. There is no inflammation around the stomach. The patient is status post prior appendectomy.  Images of the pelvis demonstrate mild diffuse thickened wall bladder unchanged. Pelvic phleboliths are identified. There is no pelvic lymphadenopathy. Mild dependent atelectasis of posterior lungs are noted. No acute abnormality is identified within the visualized bones.  IMPRESSION: No free air or inflammation surrounding the stomach. There is no bowel obstruction.   Electronically Signed   By: Sherian Rein M.D.   On: 01/02/2014 16:08     EKG Interpretation   Date/Time:  Thursday January 02 2014 12:19:58 EST Ventricular Rate:  98 PR Interval:  156 QRS Duration: 84 QT Interval:  351 QTC Calculation: 448 R Axis:   88 Text Interpretation:  Sinus rhythm Confirmed by Agripina Guyette  MD, Qaadir Kent  [phone]) on 01/02/2014 12:35:01 PM      MDM   Final diagnoses:  Abdominal pain  Upper GI bleed    Patient presents with acute onset of abdominal pain and vomiting that occurred around 11:00 this morning. Patient woke up with acute pain mostly right-sided. And vomited about 3 times of pulls up blood. Patient has similar problem back in September and was admitted to [facility]. Did not require blood transfusion.  Patient with pain medicine Protonix anti-medics with improvement. Abdomen was tender on the right side. Her function tests without significant abnormalities. No evidence of pancreatitis. CT scan of the abdomen is pending. Hemoglobin and hematocrit without any evidence of significant anemia or blood loss. Vital signs without tachycardia or hypotension.  Patient will require admission and observation due to 3 episodes of vomiting red blood. CT scan will determine whether there is an date intra-abdominal abnormalities like a perforated ulcer evidence of any  gallbladder problem.  CT scan is negative. No acute intra-abdominal process.  Vanetta MuldersScott Opie Maclaughlin, MD 01/02/14 1548  Vanetta MuldersScott  Serita Degroote, MD 01/02/14 [phone]

## 2014-01-02 NOTE — ED Notes (Addendum)
Pt presents from home with c/o hematemesis and abd pain that began today around 1100.  Pt reports vomiting bright red blood x3 with severe generalized abdominal pain. Pt presents with obvious bright red blood in/around mouth. Pt has hx stomach ulcers and diabetes. Pt is A&Ox4 diaphoretic, crying out in pain, guarding abdomen.

## 2014-01-02 NOTE — ED Notes (Signed)
Unable to obtain bloodwork from IV start. Phlebotomy notified.

## 2014-01-02 NOTE — Progress Notes (Signed)
Pt alert and responsive. CBG 75. Notified on call, Harlene RamusA. Gottschalk, MD. New orders received. Will continue to monitor.

## 2014-01-02 NOTE — ED Notes (Addendum)
Dr. Gwendolyn GrantWalden [facility] at bedside.

## 2014-01-02 NOTE — Progress Notes (Signed)
Pt admitted to 6E06 from ED. Pt is alert and oriented x4, able to follow commands, denies any pain at this time. VSS, no s/s of respiratory distress room air. Standard hospital orientation completed, bed in lowest position, call bell is within reach. Will continue to monitor pt closely. [facility]

## 2014-01-03 ENCOUNTER — Encounter (HOSPITAL_COMMUNITY): Payer: Self-pay | Admitting: *Deleted

## 2014-01-03 ENCOUNTER — Encounter (HOSPITAL_COMMUNITY)
Admission: EM | Disposition: A | Discharge status: Home / Self Care | Payer: PRIVATE HEALTH INSURANCE | Source: Home / Self Care | Attending: Family Medicine

## 2014-01-03 HISTORY — PX: ESOPHAGOGASTRODUODENOSCOPY: SHX5428

## 2014-01-03 LAB — CBC
HEMATOCRIT: 36.8 % — AB (ref 39.0–52.0)
Hemoglobin: 12.2 g/dL — ABNORMAL LOW (ref 13.0–17.0)
MCH: 26.7 pg (ref 26.0–34.0)
MCHC: 33.2 g/dL (ref 30.0–36.0)
MCV: 80.5 fL (ref 78.0–100.0)
Platelets: 149 10*3/uL — ABNORMAL LOW (ref 150–400)
RBC: 4.57 MIL/uL (ref 4.22–5.81)
RDW: 12.6 % (ref 11.5–15.5)
WBC: 4.1 10*3/uL (ref 4.0–10.5)

## 2014-01-03 LAB — BASIC METABOLIC PANEL
ANION GAP: 11 (ref 5–15)
BUN: 12 mg/dL (ref 6–23)
CALCIUM: 8.5 mg/dL (ref 8.4–10.5)
CO2: 27 meq/L (ref 19–32)
CREATININE: 1.13 mg/dL (ref 0.50–1.35)
Chloride: 105 mEq/L (ref 96–112)
GFR calc Af Amer: >90 mL/min (ref >90)
GFR calc non Af Amer: 86 mL/min — ABNORMAL LOW (ref >90)
Glucose, Bld: 78 mg/dL (ref 70–99)
Potassium: 3.5 mEq/L — ABNORMAL LOW (ref 3.7–5.3)
SODIUM: 143 meq/L (ref 137–147)

## 2014-01-03 LAB — GLUCOSE, CAPILLARY
Glucose-Capillary: 93 mg/dL (ref 70–99)
Glucose-Capillary: 78 mg/dL (ref 70–99)
Glucose-Capillary: 86 mg/dL (ref 70–99)
Glucose-Capillary: 82 mg/dL (ref 70–99)
Glucose-Capillary: 52 mg/dL — ABNORMAL LOW (ref 70–99)
Glucose-Capillary: 65 mg/dL — ABNORMAL LOW (ref 70–99)
Glucose-Capillary: 83 mg/dL (ref 70–99)

## 2014-01-03 SURGERY — EGD (ESOPHAGOGASTRODUODENOSCOPY)
Anesthesia: Moderate Sedation

## 2014-01-03 MED ORDER — MIDAZOLAM HCL 10 MG/2ML IJ SOLN
INTRAMUSCULAR | Status: DC | PRN
  Administered 2014-01-03: 2 mg via INTRAVENOUS
  Administered 2014-01-03: 1 mg via INTRAVENOUS

## 2014-01-03 MED ORDER — SODIUM CHLORIDE 0.9 % IV SOLN
INTRAVENOUS | Status: DC
  Administered 2014-01-03: 500 mL via INTRAVENOUS

## 2014-01-03 MED ORDER — FENTANYL CITRATE 0.05 MG/ML IJ SOLN
INTRAMUSCULAR | Status: DC | PRN
  Administered 2014-01-03 (×2): 25 ug via INTRAVENOUS

## 2014-01-03 MED ORDER — GLUCOSE 40 % PO GEL: 1.0000 | Freq: Once | ORAL | Status: DC

## 2014-01-03 MED ORDER — LIDOCAINE VISCOUS 2 % MT SOLN
OROMUCOSAL | Status: AC
  Filled 2014-01-03: qty 15

## 2014-01-03 MED ORDER — MIDAZOLAM HCL 5 MG/ML IJ SOLN
INTRAMUSCULAR | Status: AC
  Filled 2014-01-03: qty 2

## 2014-01-03 MED ORDER — DIPHENHYDRAMINE HCL 50 MG/ML IJ SOLN
INTRAMUSCULAR | Status: AC
  Filled 2014-01-03: qty 1

## 2014-01-03 MED ORDER — FENTANYL CITRATE 0.05 MG/ML IJ SOLN
INTRAMUSCULAR | Status: AC
  Filled 2014-01-03: qty 4

## 2014-01-03 MED ORDER — LIDOCAINE VISCOUS 2 % MT SOLN
OROMUCOSAL | Status: DC | PRN
  Administered 2014-01-03: 20 mL via OROMUCOSAL

## 2014-01-03 MED ORDER — DIPHENHYDRAMINE HCL 50 MG/ML IJ SOLN
INTRAMUSCULAR | Status: DC | PRN
  Administered 2014-01-03: 25 mg via INTRAVENOUS

## 2014-01-03 MED ORDER — GLUCOSE 40 % PO GEL
ORAL | Status: AC
  Administered 2014-01-03: 17:00:00
  Filled 2014-01-03: qty 1

## 2014-01-03 NOTE — Progress Notes (Signed)
Family Medicine Teaching Service Daily Progress Note Intern Pager: [phone]  Patient name: Tyler [name] Medical record number: [id number] Date of birth: 1985/07/13 Age: 28 y.o. Gender: male  Primary Care Provider: No PCP Per Patient Consultants: GI Code Status: Full  Pt Overview and Major Events to Date:  12/10 - Admitted with Hematemesis   Assessment and Plan: Tyler [name] is a 28 y.o. male presenting with hematemesis . PMH is significant for history of GI bleed, T1DM, anxiety, depression, PTSD.   Hematemesis/Abdominal pain. Likely due to known peptic ulcer disease. Hemodynamically stable Hgb stable. CT normal with no evidence for pancreatitis or hepatic/gall bladder pathology, and no free air making perforation unlikely. No lab evidence of infection, pancreatitis, or gall bladder pathology.  -GI consulted, appreciate assistance -Obtain records from [facility]  -Protonix IV bid -zofran prn nausea, dilaudid prn pain -f/u Gastroccult -f/u FOBT -Continue to monitor -monitor for worsening abdominal pain  T1DM. A1c 7.6. Home regimen: 15U lantus daily. -Half home lantus to 8U dose while NPO -SSI -monitor CBGs q4 hours while NPO  Anxiety/Depression/PTSD.  -Holding home gabapentin, hydroxyzine, and zoloft while NPO - restart when able to take PO  FEN/GI: NPO, D5 1/2NS@100cc /hr Prophylaxis: SCDs, no pharmacologic prophylaxis given GI bleed  Disposition: Admitted pending above management.   Subjective:  No other episodes of vomiting this morning. Abdominal pain somewhat improved. No chest pain or shortness of breath.  Objective: Temp:  [97.7 F (36.5 C)-98.5 F (36.9 C)] 97.9 F (36.6 C) (12/11 0448) Pulse Rate:  [71-96] 78 (12/11 0448) Resp:  [9-14] 14 (12/10 2042) BP: (97-134)/(59-88) 98/67 mmHg (12/11 0448) SpO2:  [95 %-99 %] 99 % (12/11 0448) Weight:  [152 lb 5.4 oz (69.1 kg)] 152 lb 5.4 oz (69.1 kg) (12/10 1758) Physical Exam: General: Young man in NAD lying in hospital  bed Cardiovascular: RRR, no murmurs Respiratory: NWOB, CTAB Abdomen: +BS, soft, diffusely tender, most significantly in epigastric area and RUQ, no rebound, negative Extremities: No cyanosis or edema  Laboratory:  Recent Labs Lab 01/02/14 1226 01/02/14 2028 01/03/14 0500  WBC 4.6 4.9 4.1  HGB 12.6* 12.6* 12.2*  HCT 37.7* 37.7* 36.8*  PLT 152 143* 149*    Recent Labs Lab 01/02/14 1226 01/03/14 0500  NA 137 143  K 4.3 3.5*  CL 99 105  CO2 24 27  BUN 19 12  CREATININE 1.07 1.13  CALCIUM 9.1 8.5  PROT 7.3  --   BILITOT 0.5  --   ALKPHOS 81  --   ALT 11  --   AST 27  --   GLUCOSE 181* 78    Imaging/Diagnostic Tests: None new  Jacquiline Doealeb Parker, MD 01/03/2014, 8:52 AM PGY-1, [place] FPTS Intern pager: [phone], text pages welcome

## 2014-01-03 NOTE — Progress Notes (Signed)
Addendum: EGD done, absolutely normal esophagus stomach and duodenum no potential bleeding sources or any old or fresh blood observed. Plan advance diet on after for any further emesis and Hemoccult stools. Call us if signs of further bleeding. Otherwise we'll sign off for now.

## 2014-01-03 NOTE — Progress Notes (Signed)
Hypoglycemic Event  CBG: 52 at 1655  Treatment: 1 tube instant glucose and 15 GM carbohydrate snack  Symptoms: None  Follow-up CBG: Time:1749 CBG Result:83  Possible Reasons for Event: Inadequate meal intake  Comments/MD notified:Walden    Tyler [name] M  Remember to initiate Hypoglycemia Order Set & complete

## 2014-01-03 NOTE — Plan of Care (Signed)
Problem: Phase I Progression Outcomes Goal: Voiding-avoid urinary catheter unless indicated Outcome: Completed/Met Date Met:  01/03/14     

## 2014-01-03 NOTE — H&P (View-Only) (Signed)
[facility] Consult Note  Referring Provider: No ref. provider found Primary Care Physician:  No PCP Per Patient Primary Gastroenterologist:  Dr.  Laurel Dimmer Complaint: Hematemesis HPI: Tyler [name] is an 28 y.o. black male  who presents after developing sudden abdominal pain nausea and vomiting described as bright red blood by both him and his fianc who witnessed it. He did it 1 more time yesterday and has noticed some dark stools. He had a similar episode and was admitted to another hospital spoke had an EGD which showed a "healed" ulcer. He is currently not on any antipeptic medication. He is an insulin-dependent diabetic. He took 1 dose of Advil about 3 days ago and otherwise does not use nonsteroidal anti-flank or drugs.  Past Medical History  Diagnosis Date  . Stomach ulcer   . Proteinuria   . Anxiety   . Depression   . PTSD (post-traumatic stress disorder)   . Type I diabetes mellitus   . Diabetic peripheral neuropathy   . Hematemesis/vomiting blood admissions 09/2013, 01/02/2014    Past Surgical History  Procedure Laterality Date  . Appendectomy      Medications Prior to Admission  Medication Sig Dispense Refill  . gabapentin (NEURONTIN) 300 MG capsule Take 300 mg by mouth 3 (three) times daily.    . hydrOXYzine (ATARAX/VISTARIL) 25 MG tablet Take 1 tablet (25 mg total) by mouth every 8 (eight) hours as needed for anxiety. 90 tablet 1  . insulin glargine (LANTUS) 100 UNIT/ML injection Inject 15 Units into the skin at bedtime.     . pantoprazole (PROTONIX) 40 MG tablet Take 40 mg by mouth 2 (two) times daily.     . sertraline (ZOLOFT) 50 MG tablet Take 0.5 tablets (25 mg total) by mouth daily. 30 tablet 1    Allergies:  Allergies  Allergen Reactions  . Banana Anaphylaxis  . Ibuprofen Other (See Comments)    ulcers    Family History  Problem Relation Age of Onset  . HIV/AIDS Father   . Drug abuse Father   . Suicidality Neg Hx   . Bipolar disorder Neg Hx    . Depression Neg Hx   . Anxiety disorder Neg Hx   . Drug abuse Sister     Social History:  reports that he has never smoked. He has never used smokeless tobacco. He reports that he does not drink alcohol or use illicit drugs.  Review of Systems: negative except as above   Blood pressure 125/84, pulse 74, temperature 98.3 F (36.8 C), temperature source Oral, resp. rate 18, height 5' 6" (1.676 m), weight 69.1 kg (152 lb 5.4 oz), SpO2 99 %. Head: Normocephalic, without obvious abnormality, atraumatic Neck: no adenopathy, no carotid bruit, no JVD, supple, symmetrical, trachea midline and thyroid not enlarged, symmetric, no tenderness/mass/nodules Resp: clear to auscultation bilaterally Cardio: regular rate and rhythm, S1, S2 normal, no murmur, click, rub or gallop GI: Abdomen soft moderately tender with normoactive bowel sounds no hepatomegaly masses or guarding Extremities: extremities normal, atraumatic, no cyanosis or edema  Results for orders placed or performed during the hospital encounter of 01/02/14 (from the past 48 hour(s))  CBC with Differential     Status: Abnormal   Collection Time: 01/02/14 12:26 PM  Result Value Ref Range   WBC 4.6 4.0 - 10.5 K/uL   RBC 4.60 4.22 - 5.81 MIL/uL   Hemoglobin 12.6 (L) 13.0 - 17.0 g/dL   HCT 37.7 (L) 39.0 - 52.0 %   MCV 82.0 78.0 -  100.0 fL   MCH 27.4 26.0 - 34.0 pg   MCHC 33.4 30.0 - 36.0 g/dL   RDW 12.8 11.5 - 15.5 %   Platelets 152 150 - 400 K/uL   Neutrophils Relative % 70 43 - 77 %   Neutro Abs 3.2 1.7 - 7.7 K/uL   Lymphocytes Relative 22 12 - 46 %   Lymphs Abs 1.0 0.7 - 4.0 K/uL   Monocytes Relative 8 3 - 12 %   Monocytes Absolute 0.4 0.1 - 1.0 K/uL   Eosinophils Relative 0 0 - 5 %   Eosinophils Absolute 0.0 0.0 - 0.7 K/uL   Basophils Relative 0 0 - 1 %   Basophils Absolute 0.0 0.0 - 0.1 K/uL  Comprehensive metabolic panel     Status: Abnormal   Collection Time: 01/02/14 12:26 PM  Result Value Ref Range   Sodium 137 137 -  147 mEq/L   Potassium 4.3 3.7 - 5.3 mEq/L   Chloride 99 96 - 112 mEq/L   CO2 24 19 - 32 mEq/L   Glucose, Bld 181 (H) 70 - 99 mg/dL   BUN 19 6 - 23 mg/dL   Creatinine, Ser 1.07 0.50 - 1.35 mg/dL   Calcium 9.1 8.4 - 10.5 mg/dL   Total Protein 7.3 6.0 - 8.3 g/dL   Albumin 3.8 3.5 - 5.2 g/dL   AST 27 0 - 37 U/L   ALT 11 0 - 53 U/L   Alkaline Phosphatase 81 39 - 117 U/L   Total Bilirubin 0.5 0.3 - 1.2 mg/dL   GFR calc non Af Amer >90 >90 mL/min   GFR calc Af Amer >90 >90 mL/min    Comment: (NOTE) The eGFR has been calculated using the CKD EPI equation. This calculation has not been validated in all clinical situations. eGFR's persistently <90 mL/min signify possible Chronic Kidney Disease.    Anion gap 14 5 - 15  Lipase, blood     Status: None   Collection Time: 01/02/14 12:26 PM  Result Value Ref Range   Lipase 12 11 - 59 U/L  I-stat troponin, ED (only if pt is 30 y.o. or older & pain is above umbilicus) - do not order at [facility]     Status: None   Collection Time: 01/02/14 12:43 PM  Result Value Ref Range   Troponin i, poc 0.00 0.00 - 0.08 ng/mL   Comment 3            Comment: Due to the release kinetics of cTnI, a negative result within the first hours of the onset of symptoms does not rule out myocardial infarction with certainty. If myocardial infarction is still suspected, repeat the test at appropriate intervals.   Urinalysis, Routine w reflex microscopic     Status: Abnormal   Collection Time: 01/02/14  1:40 PM  Result Value Ref Range   Color, Urine YELLOW YELLOW   APPearance CLEAR CLEAR   Specific Gravity, Urine 1.024 1.005 - 1.030   pH 6.0 5.0 - 8.0   Glucose, UA >1000 (A) NEGATIVE mg/dL   Hgb urine dipstick NEGATIVE NEGATIVE   Bilirubin Urine NEGATIVE NEGATIVE   Ketones, ur NEGATIVE NEGATIVE mg/dL   Protein, ur NEGATIVE NEGATIVE mg/dL   Urobilinogen, UA 1.0 0.0 - 1.0 mg/dL   Nitrite NEGATIVE NEGATIVE   Leukocytes, UA NEGATIVE NEGATIVE  Type and screen      Status: None   Collection Time: 01/02/14  1:40 PM  Result Value Ref Range   ABO/RH(D) O POS  Antibody Screen NEG    Sample Expiration 01/05/2014   Urine microscopic-add on     Status: None   Collection Time: 01/02/14  1:40 PM  Result Value Ref Range   Squamous Epithelial / LPF RARE RARE   WBC, UA 3-6 <3 WBC/hpf   Bacteria, UA RARE RARE  ABO/Rh     Status: None   Collection Time: 01/02/14  1:40 PM  Result Value Ref Range   ABO/RH(D) O POS   Glucose, capillary     Status: Abnormal   Collection Time: 01/02/14  6:19 PM  Result Value Ref Range   Glucose-Capillary 116 (H) 70 - 99 mg/dL  CBC     Status: Abnormal   Collection Time: 01/02/14  8:28 PM  Result Value Ref Range   WBC 4.9 4.0 - 10.5 K/uL   RBC 4.71 4.22 - 5.81 MIL/uL   Hemoglobin 12.6 (L) 13.0 - 17.0 g/dL   HCT 37.7 (L) 39.0 - 52.0 %   MCV 80.0 78.0 - 100.0 fL   MCH 26.8 26.0 - 34.0 pg   MCHC 33.4 30.0 - 36.0 g/dL   RDW 12.7 11.5 - 15.5 %   Platelets 143 (L) 150 - 400 K/uL  Glucose, capillary     Status: None   Collection Time: 01/02/14 10:39 PM  Result Value Ref Range   Glucose-Capillary 75 70 - 99 mg/dL  Glucose, capillary     Status: None   Collection Time: 01/03/14  1:09 AM  Result Value Ref Range   Glucose-Capillary 93 70 - 99 mg/dL  Basic metabolic panel     Status: Abnormal   Collection Time: 01/03/14  5:00 AM  Result Value Ref Range   Sodium 143 137 - 147 mEq/L   Potassium 3.5 (L) 3.7 - 5.3 mEq/L   Chloride 105 96 - 112 mEq/L   CO2 27 19 - 32 mEq/L   Glucose, Bld 78 70 - 99 mg/dL   BUN 12 6 - 23 mg/dL   Creatinine, Ser 1.13 0.50 - 1.35 mg/dL   Calcium 8.5 8.4 - 10.5 mg/dL   GFR calc non Af Amer 86 (L) >90 mL/min   GFR calc Af Amer >90 >90 mL/min    Comment: (NOTE) The eGFR has been calculated using the CKD EPI equation. This calculation has not been validated in all clinical situations. eGFR's persistently <90 mL/min signify possible Chronic Kidney Disease.    Anion gap 11 5 - 15  CBC      Status: Abnormal   Collection Time: 01/03/14  5:00 AM  Result Value Ref Range   WBC 4.1 4.0 - 10.5 K/uL   RBC 4.57 4.22 - 5.81 MIL/uL   Hemoglobin 12.2 (L) 13.0 - 17.0 g/dL   HCT 36.8 (L) 39.0 - 52.0 %   MCV 80.5 78.0 - 100.0 fL   MCH 26.7 26.0 - 34.0 pg   MCHC 33.2 30.0 - 36.0 g/dL   RDW 12.6 11.5 - 15.5 %   Platelets 149 (L) 150 - 400 K/uL  Glucose, capillary     Status: None   Collection Time: 01/03/14  5:56 AM  Result Value Ref Range   Glucose-Capillary 78 70 - 99 mg/dL  Glucose, capillary     Status: None   Collection Time: 01/03/14  7:26 AM  Result Value Ref Range   Glucose-Capillary 86 70 - 99 mg/dL  Glucose, capillary     Status: None   Collection Time: 01/03/14 11:39 AM  Result Value Ref Range  Glucose-Capillary 82 70 - 99 mg/dL   Ct Abdomen Pelvis W Contrast  01/02/2014   CLINICAL DATA:  Hematemesis and abdomen pain CT starting today at 11 a.m. Patient has a history of diabetes and ulcers.  EXAM: CT ABDOMEN AND PELVIS WITH CONTRAST  TECHNIQUE: Multidetector CT imaging of the abdomen and pelvis was performed using the standard protocol following bolus administration of intravenous contrast.  CONTRAST:  43m OMNIPAQUE IOHEXOL 300 MG/ML  SOLN  COMPARISON:  October 15, 2013  FINDINGS: The liver, spleen, pancreas, gallbladder, adrenal glands and kidneys are normal. There is no hydronephrosis bilaterally. The aorta is normal. There is no abdominal lymphadenopathy. There is no free air. There is no small bowel obstruction or diverticulitis. There is no inflammation around the stomach. The patient is status post prior appendectomy.  Images of the pelvis demonstrate mild diffuse thickened wall bladder unchanged. Pelvic phleboliths are identified. There is no pelvic lymphadenopathy. Mild dependent atelectasis of posterior lungs are noted. No acute abnormality is identified within the visualized bones.  IMPRESSION: No free air or inflammation surrounding the stomach. There is no bowel  obstruction.   Electronically Signed   By: WAbelardo DieselM.D.   On: 01/02/2014 16:08    Assessment: Reported hematemesis with previous hematemesis 3 months ago and possible peptic ulcer disease. Plan:  Will proceed with EGD later today. HTFTDD,UKGUC 01/03/2014, 12:46 PM

## 2014-01-03 NOTE — Discharge Summary (Signed)
[facility] Summary  Patient name: Tyler [name] Medical record number: [id number] Date of birth: [birth year] Age: 28 y.o. Gender: male Date of Admission: 01/02/2014  Date of Discharge:01/03/2014  Admitting Physician: Tobey GrimJeffrey H Walden, MD  Primary Care Provider: No PCP Per Patient Consultants: Gastroenterology  Indication for Hospitalization: Hematemesis   Discharge Diagnoses/Problem List:  Hematemesis, T1DM. Anxiety/Depression/PTSD.   Disposition: Home  Discharge Condition: Improved  Discharge Exam: Please see progress note for day of discharge  Brief Hospital Course:  Tyler CahillMarcus Ellsworth is a 28 y.o. male presenting with hematemesis . PMH is significant for history of GI bleed, T1DM, anxiety, depression, PTSD.   Hematemesis/Abdominal pain. Patient presented with 3 episodes of hematemesis in the setting of known peptic ulcer disease and prior similar episode 3 months ago. Patient was observed to have blood in and around his mouth on admission. The patient was started on IV protonix. Initial work up, including abdominal CT was generally unremarkable. The patient remained hemodynamically stable throughout his stay with near normal hemoglobin values. GI was consulted and the patient underwent EGD which showed normal mucous with no sites of active or old bleeding. The patient experienced no further episodes of hematemesis during his stay.   The patient's other conditions including T1DM, Anxiety, Depression, and PTSD were stable and no changes were made to the patient's home medications.   Issues for Follow Up:  1) Continue to monitor for further episodes of bleeding 2) Attempt to obtain gastroccult and FOBT to r/o GI bleed.   Significant Procedures: 12/11 EGD - No signs of bleeding  Significant Labs and Imaging:   Recent Labs Lab 01/02/14 1226 01/02/14 2028 01/03/14 0500  WBC 4.6 4.9 4.1  HGB 12.6* 12.6* 12.2*  HCT 37.7* 37.7* 36.8*  PLT 152  143* 149*    Recent Labs Lab 01/02/14 1226 01/03/14 0500  NA 137 143  K 4.3 3.5*  CL 99 105  CO2 24 27  GLUCOSE 181* 78  BUN 19 12  CREATININE 1.07 1.13  CALCIUM 9.1 8.5  ALKPHOS 81  --   AST 27  --   ALT 11  --   ALBUMIN 3.8  --      Results/Tests Pending at Time of Discharge: none  Discharge Medications:    Medication List    TAKE these medications        gabapentin 300 MG capsule  Commonly known as:  NEURONTIN  Take 300 mg by mouth 3 (three) times daily.     hydrOXYzine 25 MG tablet  Commonly known as:  ATARAX/VISTARIL  Take 1 tablet (25 mg total) by mouth every 8 (eight) hours as needed for anxiety.     insulin glargine 100 UNIT/ML injection  Commonly known as:  LANTUS  Inject 15 Units into the skin at bedtime.     pantoprazole 40 MG tablet  Commonly known as:  PROTONIX  Take 40 mg by mouth 2 (two) times daily.     sertraline 50 MG tablet  Commonly known as:  ZOLOFT  Take 0.5 tablets (25 mg total) by mouth daily.        Discharge Instructions: Please refer to Patient Instructions section of EMR for full details.  Patient was counseled important signs and symptoms that should prompt return to medical care, changes in medications, dietary instructions, activity restrictions, and follow up appointments.   Follow-Up Appointments: Follow-up Information    Follow up with [facility]    .  Contact information:   [address] [place] [id number] [phone]      Please follow up.   Why:  To establish primary care. They can refer you to endocrinology     Jamal CollinJames R Darline Faith, MD 01/03/2014, 6:58 PM PGY-2, [facility]

## 2014-01-03 NOTE — Interval H&P Note (Signed)
History and Physical Interval Note:  01/03/2014 3:04 PM  Tyler [name]  has presented today for surgery, with the diagnosis of hematemesis  The various methods of treatment have been discussed with the patient and family. After consideration of risks, benefits and other options for treatment, the patient has consented to  Procedure(s): ESOPHAGOGASTRODUODENOSCOPY (EGD) (N/A) as a surgical intervention .  The patient's history has been reviewed, patient examined, no change in status, stable for surgery.  I have reviewed the patient's chart and labs.  Questions were answered to the patient's satisfaction.     Eirene Rather C

## 2014-01-03 NOTE — Op Note (Signed)
[facility] [address] [place], [id number]   ENDOSCOPY PROCEDURE REPORT  PATIENT: Tyler [name], Tyler [name]  MR#: [id number] BIRTHDATE: [birth year] , 28  yrs. old GENDER: male ENDOSCOPIST: Dorena CookeyJohn Dmani Mizer, MD REFERRED BY: PROCEDURE DATE:  01/03/2014 PROCEDURE: ASA CLASS: INDICATIONS:  hematemesis MEDICATIONS: 75 g fentanyl 3 mg Versed 25 mg fentanyl TOPICAL ANESTHETIC: viscous Xylocaine  DESCRIPTION OF PROCEDURE: After the risks benefits and alternatives of the procedure were thoroughly explained, informed consent was obtained.  The Pentax Gastroscope [id number] endoscope was introduced through the mouth and advanced to the second portion of the duodenum , Without limitations.  The instrument was slowly withdrawn as the mucosa was fully examined.    esophagus completely normal no signs of GI bleeding Stomach: Normal no fresh or old blood seen. Pylorus normal Duodenum normal no fresh or old blood seen              The scope was then withdrawn from the patient and the procedure completed.  COMPLICATIONS: There were no immediate complications.  ENDOSCOPIC IMPRESSION: normal exam, no visualized source of reported hematemesis  RECOMMENDATIONS: advance diet, monitor stools and hemoglobin call us if signs of further bleeding.  REPEAT EXAM:  eSigned:  Dorena CookeyJohn Dung Prien, MD 01/03/2014 3:25 PM    CC:  CPT CODES: ICD CODES:  The ICD and CPT codes recommended by this software are interpretations from the data that the clinical staff has captured with the software.  The verification of the translation of this report to the ICD and CPT codes and modifiers is the sole responsibility of the health care institution and practicing physician where this report was generated.  [employer] will not be held responsible for the validity of the ICD and CPT codes included on this report.  AMA assumes no liability for data contained or not contained herein. CPT is  a Publishing rights managerregistered trademark of the [employer].  PATIENT NAME:  Tyler [name], Tyler [name] MR#: [id number]

## 2014-01-03 NOTE — Progress Notes (Signed)
CBG 78 this morning.  Paged Gottschalk, DO regarding CBG. No new orders received.  Will continue to monitor.

## 2014-01-03 NOTE — Progress Notes (Signed)
Discharge instructions and medications discussed with patient. Patient showed no barriers to discharge. IV removed; catheter intact. Assessment unchanged from morning. Pt discharged to home with girlfriend.

## 2014-01-03 NOTE — Consult Note (Signed)
[facility] Consult Note  Referring Provider: No ref. provider found Primary Care Physician:  No PCP Per Patient Primary Gastroenterologist:  Dr.  Chief Complaint: Hematemesis HPI: Tyler [name] is an 28 y.o. black male  who presents after developing sudden abdominal pain nausea and vomiting described as bright red blood by both him and his fianc who witnessed it. He did it 1 more time yesterday and has noticed some dark stools. He had a similar episode and was admitted to another hospital spoke had an EGD which showed a "healed" ulcer. He is currently not on any antipeptic medication. He is an insulin-dependent diabetic. He took 1 dose of Advil about 3 days ago and otherwise does not use nonsteroidal anti-flank or drugs.  Past Medical History  Diagnosis Date  . Stomach ulcer   . Proteinuria   . Anxiety   . Depression   . PTSD (post-traumatic stress disorder)   . Type I diabetes mellitus   . Diabetic peripheral neuropathy   . Hematemesis/vomiting blood admissions 09/2013, 01/02/2014    Past Surgical History  Procedure Laterality Date  . Appendectomy      Medications Prior to Admission  Medication Sig Dispense Refill  . gabapentin (NEURONTIN) 300 MG capsule Take 300 mg by mouth 3 (three) times daily.    . hydrOXYzine (ATARAX/VISTARIL) 25 MG tablet Take 1 tablet (25 mg total) by mouth every 8 (eight) hours as needed for anxiety. 90 tablet 1  . insulin glargine (LANTUS) 100 UNIT/ML injection Inject 15 Units into the skin at bedtime.     . pantoprazole (PROTONIX) 40 MG tablet Take 40 mg by mouth 2 (two) times daily.     . sertraline (ZOLOFT) 50 MG tablet Take 0.5 tablets (25 mg total) by mouth daily. 30 tablet 1    Allergies:  Allergies  Allergen Reactions  . Banana Anaphylaxis  . Ibuprofen Other (See Comments)    ulcers    Family History  Problem Relation Age of Onset  . HIV/AIDS Father   . Drug abuse Father   . Suicidality Neg Hx   . Bipolar disorder Neg Hx    . Depression Neg Hx   . Anxiety disorder Neg Hx   . Drug abuse Sister     Social History:  reports that he has never smoked. He has never used smokeless tobacco. He reports that he does not drink alcohol or use illicit drugs.  Review of Systems: negative except as above   Blood pressure 125/84, pulse 74, temperature 98.3 F (36.8 C), temperature source Oral, resp. rate 18, height 5' 6" (1.676 m), weight 69.1 kg (152 lb 5.4 oz), SpO2 99 %. Head: Normocephalic, without obvious abnormality, atraumatic Neck: no adenopathy, no carotid bruit, no JVD, supple, symmetrical, trachea midline and thyroid not enlarged, symmetric, no tenderness/mass/nodules Resp: clear to auscultation bilaterally Cardio: regular rate and rhythm, S1, S2 normal, no murmur, click, rub or gallop GI: Abdomen soft moderately tender with normoactive bowel sounds no hepatomegaly masses or guarding Extremities: extremities normal, atraumatic, no cyanosis or edema  Results for orders placed or performed during the hospital encounter of 01/02/14 (from the past 48 hour(s))  CBC with Differential     Status: Abnormal   Collection Time: 01/02/14 12:26 PM  Result Value Ref Range   WBC 4.6 4.0 - 10.5 K/uL   RBC 4.60 4.22 - 5.81 MIL/uL   Hemoglobin 12.6 (L) 13.0 - 17.0 g/dL   HCT 37.7 (L) 39.0 - 52.0 %   MCV 82.0 78.0 -   100.0 fL   MCH 27.4 26.0 - 34.0 pg   MCHC 33.4 30.0 - 36.0 g/dL   RDW 12.8 11.5 - 15.5 %   Platelets 152 150 - 400 K/uL   Neutrophils Relative % 70 43 - 77 %   Neutro Abs 3.2 1.7 - 7.7 K/uL   Lymphocytes Relative 22 12 - 46 %   Lymphs Abs 1.0 0.7 - 4.0 K/uL   Monocytes Relative 8 3 - 12 %   Monocytes Absolute 0.4 0.1 - 1.0 K/uL   Eosinophils Relative 0 0 - 5 %   Eosinophils Absolute 0.0 0.0 - 0.7 K/uL   Basophils Relative 0 0 - 1 %   Basophils Absolute 0.0 0.0 - 0.1 K/uL  Comprehensive metabolic panel     Status: Abnormal   Collection Time: 01/02/14 12:26 PM  Result Value Ref Range   Sodium 137 137 -  147 mEq/L   Potassium 4.3 3.7 - 5.3 mEq/L   Chloride 99 96 - 112 mEq/L   CO2 24 19 - 32 mEq/L   Glucose, Bld 181 (H) 70 - 99 mg/dL   BUN 19 6 - 23 mg/dL   Creatinine, Ser 1.07 0.50 - 1.35 mg/dL   Calcium 9.1 8.4 - 10.5 mg/dL   Total Protein 7.3 6.0 - 8.3 g/dL   Albumin 3.8 3.5 - 5.2 g/dL   AST 27 0 - 37 U/L   ALT 11 0 - 53 U/L   Alkaline Phosphatase 81 39 - 117 U/L   Total Bilirubin 0.5 0.3 - 1.2 mg/dL   GFR calc non Af Amer >90 >90 mL/min   GFR calc Af Amer >90 >90 mL/min    Comment: (NOTE) The eGFR has been calculated using the CKD EPI equation. This calculation has not been validated in all clinical situations. eGFR's persistently <90 mL/min signify possible Chronic Kidney Disease.    Anion gap 14 5 - 15  Lipase, blood     Status: None   Collection Time: 01/02/14 12:26 PM  Result Value Ref Range   Lipase 12 11 - 59 U/L  I-stat troponin, ED (only if pt is 30 y.o. or older & pain is above umbilicus) - do not order at [facility]     Status: None   Collection Time: 01/02/14 12:43 PM  Result Value Ref Range   Troponin i, poc 0.00 0.00 - 0.08 ng/mL   Comment 3            Comment: Due to the release kinetics of cTnI, a negative result within the first hours of the onset of symptoms does not rule out myocardial infarction with certainty. If myocardial infarction is still suspected, repeat the test at appropriate intervals.   Urinalysis, Routine w reflex microscopic     Status: Abnormal   Collection Time: 01/02/14  1:40 PM  Result Value Ref Range   Color, Urine YELLOW YELLOW   APPearance CLEAR CLEAR   Specific Gravity, Urine 1.024 1.005 - 1.030   pH 6.0 5.0 - 8.0   Glucose, UA >1000 (A) NEGATIVE mg/dL   Hgb urine dipstick NEGATIVE NEGATIVE   Bilirubin Urine NEGATIVE NEGATIVE   Ketones, ur NEGATIVE NEGATIVE mg/dL   Protein, ur NEGATIVE NEGATIVE mg/dL   Urobilinogen, UA 1.0 0.0 - 1.0 mg/dL   Nitrite NEGATIVE NEGATIVE   Leukocytes, UA NEGATIVE NEGATIVE  Type and screen      Status: None   Collection Time: 01/02/14  1:40 PM  Result Value Ref Range   ABO/RH(D) O POS      Antibody Screen NEG    Sample Expiration 01/05/2014   Urine microscopic-add on     Status: None   Collection Time: 01/02/14  1:40 PM  Result Value Ref Range   Squamous Epithelial / LPF RARE RARE   WBC, UA 3-6 <3 WBC/hpf   Bacteria, UA RARE RARE  ABO/Rh     Status: None   Collection Time: 01/02/14  1:40 PM  Result Value Ref Range   ABO/RH(D) O POS   Glucose, capillary     Status: Abnormal   Collection Time: 01/02/14  6:19 PM  Result Value Ref Range   Glucose-Capillary 116 (H) 70 - 99 mg/dL  CBC     Status: Abnormal   Collection Time: 01/02/14  8:28 PM  Result Value Ref Range   WBC 4.9 4.0 - 10.5 K/uL   RBC 4.71 4.22 - 5.81 MIL/uL   Hemoglobin 12.6 (L) 13.0 - 17.0 g/dL   HCT 37.7 (L) 39.0 - 52.0 %   MCV 80.0 78.0 - 100.0 fL   MCH 26.8 26.0 - 34.0 pg   MCHC 33.4 30.0 - 36.0 g/dL   RDW 12.7 11.5 - 15.5 %   Platelets 143 (L) 150 - 400 K/uL  Glucose, capillary     Status: None   Collection Time: 01/02/14 10:39 PM  Result Value Ref Range   Glucose-Capillary 75 70 - 99 mg/dL  Glucose, capillary     Status: None   Collection Time: 01/03/14  1:09 AM  Result Value Ref Range   Glucose-Capillary 93 70 - 99 mg/dL  Basic metabolic panel     Status: Abnormal   Collection Time: 01/03/14  5:00 AM  Result Value Ref Range   Sodium 143 137 - 147 mEq/L   Potassium 3.5 (L) 3.7 - 5.3 mEq/L   Chloride 105 96 - 112 mEq/L   CO2 27 19 - 32 mEq/L   Glucose, Bld 78 70 - 99 mg/dL   BUN 12 6 - 23 mg/dL   Creatinine, Ser 1.13 0.50 - 1.35 mg/dL   Calcium 8.5 8.4 - 10.5 mg/dL   GFR calc non Af Amer 86 (L) >90 mL/min   GFR calc Af Amer >90 >90 mL/min    Comment: (NOTE) The eGFR has been calculated using the CKD EPI equation. This calculation has not been validated in all clinical situations. eGFR's persistently <90 mL/min signify possible Chronic Kidney Disease.    Anion gap 11 5 - 15  CBC      Status: Abnormal   Collection Time: 01/03/14  5:00 AM  Result Value Ref Range   WBC 4.1 4.0 - 10.5 K/uL   RBC 4.57 4.22 - 5.81 MIL/uL   Hemoglobin 12.2 (L) 13.0 - 17.0 g/dL   HCT 36.8 (L) 39.0 - 52.0 %   MCV 80.5 78.0 - 100.0 fL   MCH 26.7 26.0 - 34.0 pg   MCHC 33.2 30.0 - 36.0 g/dL   RDW 12.6 11.5 - 15.5 %   Platelets 149 (L) 150 - 400 K/uL  Glucose, capillary     Status: None   Collection Time: 01/03/14  5:56 AM  Result Value Ref Range   Glucose-Capillary 78 70 - 99 mg/dL  Glucose, capillary     Status: None   Collection Time: 01/03/14  7:26 AM  Result Value Ref Range   Glucose-Capillary 86 70 - 99 mg/dL  Glucose, capillary     Status: None   Collection Time: 01/03/14 11:39 AM  Result Value Ref Range     Glucose-Capillary 82 70 - 99 mg/dL   Ct Abdomen Pelvis W Contrast  01/02/2014   CLINICAL DATA:  Hematemesis and abdomen pain CT starting today at 11 a.m. Patient has a history of diabetes and ulcers.  EXAM: CT ABDOMEN AND PELVIS WITH CONTRAST  TECHNIQUE: Multidetector CT imaging of the abdomen and pelvis was performed using the standard protocol following bolus administration of intravenous contrast.  CONTRAST:  80mL OMNIPAQUE IOHEXOL 300 MG/ML  SOLN  COMPARISON:  October 15, 2013  FINDINGS: The liver, spleen, pancreas, gallbladder, adrenal glands and kidneys are normal. There is no hydronephrosis bilaterally. The aorta is normal. There is no abdominal lymphadenopathy. There is no free air. There is no small bowel obstruction or diverticulitis. There is no inflammation around the stomach. The patient is status post prior appendectomy.  Images of the pelvis demonstrate mild diffuse thickened wall bladder unchanged. Pelvic phleboliths are identified. There is no pelvic lymphadenopathy. Mild dependent atelectasis of posterior lungs are noted. No acute abnormality is identified within the visualized bones.  IMPRESSION: No free air or inflammation surrounding the stomach. There is no bowel  obstruction.   Electronically Signed   By: Wei-Chen  Lin M.D.   On: 01/02/2014 16:08    Assessment: Reported hematemesis with previous hematemesis 3 months ago and possible peptic ulcer disease. Plan:  Will proceed with EGD later today. Krisi Azua C 01/03/2014, 12:46 PM    

## 2014-01-03 NOTE — Discharge Instructions (Signed)
You were admitted after vomiting blood. You were seen by our gastroenterologist and a scope performed that did not show any signs of bleeding. You need to establish a primary care doctor for ongoing evaluation. Your primary care doctor can refer you to an endocrinologist.   Hematemesis This condition is the vomiting of blood. CAUSES  This can happen if you have a peptic ulcer or an irritation of the throat, stomach, or small bowel. Vomiting over and over again or swallowing blood from a nosebleed, coughing or facial injury can also result in bloody vomit. Anti-inflammatory pain medicines are a common cause of this potentially dangerous condition. The most serious causes of vomiting blood include:  Ulcers (a bacteria called H. pylori is common cause of ulcers).  Clotting problems.  Alcoholism.  Cirrhosis. TREATMENT  Treatment depends on the cause and the severity of the bleeding. Small amounts of blood streaks in the vomit is not the same as vomiting large amounts of bloody or dark, coffee grounds-like material. Weakness, fainting, dehydration, anemia, and continued alcohol or drug use increase the risk. Examination may include blood, vomit, or stool tests. The presence of bloody or dark stool that tests positive for blood (Hemoccult) means the bleeding has been going on for some time. Endoscopy and imaging studies may be done. Emergency treatment may include:  IV medicines or fluids.  Blood transfusions.  Surgery. Hospital care is required for high risk patients or when IV fluids or blood is needed. Upper GI bleeding can cause shock and death if not controlled. HOME CARE INSTRUCTIONS   Your treatment does not require hospital care at this time.  Remain at rest until your condition improves.  Drink clear liquids as tolerated.  Avoid:  Alcohol.  Nicotine.  Aspirin.  Any other anti-inflammatory medicine (ibuprofen, naproxen, and many others).  Medications to suppress stomach  acid or vomiting may be needed. Take all your medicine as prescribed.  Be sure to see your caregiver for follow-up as recommended. SEEK IMMEDIATE MEDICAL CARE IF:   You have repeated vomiting, dehydration, fainting, or extreme weakness.  You are vomiting large amounts of bloody or dark material.  You pass large, dark or bloody stools. Document Released: 02/18/2004 Document Revised: 04/04/2011 Document Reviewed: 03/05/2008 [facility] ServicesExit[facility] Patient Information 2015 [place], [place]. This information is not intended to replace advice given to you by your health care provider. Make sure you discuss any questions you have with your health care provider.

## 2014-01-06 ENCOUNTER — Encounter (HOSPITAL_COMMUNITY): Payer: Self-pay | Admitting: Gastroenterology

## 2014-01-14 ENCOUNTER — Other Ambulatory Visit: Payer: Self-pay | Admitting: Family Medicine

## 2014-01-14 DIAGNOSIS — E1040 Type 1 diabetes mellitus with diabetic neuropathy, unspecified: Secondary | ICD-10-CM

## 2014-01-21 ENCOUNTER — Ambulatory Visit (HOSPITAL_COMMUNITY): Payer: Self-pay | Admitting: Psychiatry

## 2014-01-28 ENCOUNTER — Ambulatory Visit (HOSPITAL_COMMUNITY): Payer: PRIVATE HEALTH INSURANCE | Admitting: Psychiatry

## 2014-02-22 ENCOUNTER — Emergency Department (HOSPITAL_COMMUNITY)
Admission: EM | Admit: 2014-02-22 | Discharge: 2014-02-22 | Disposition: A | Discharge status: Home / Self Care | Payer: No Typology Code available for payment source | Attending: Emergency Medicine | Admitting: Emergency Medicine

## 2014-02-22 ENCOUNTER — Encounter (HOSPITAL_COMMUNITY): Payer: Self-pay | Admitting: Emergency Medicine

## 2014-02-22 DIAGNOSIS — Z79899 Other long term (current) drug therapy: Secondary | ICD-10-CM | POA: Diagnosis not present

## 2014-02-22 DIAGNOSIS — Z794 Long term (current) use of insulin: Secondary | ICD-10-CM | POA: Diagnosis not present

## 2014-02-22 DIAGNOSIS — E1042 Type 1 diabetes mellitus with diabetic polyneuropathy: Secondary | ICD-10-CM | POA: Insufficient documentation

## 2014-02-22 DIAGNOSIS — M545 Low back pain: Secondary | ICD-10-CM | POA: Diagnosis not present

## 2014-02-22 DIAGNOSIS — R51 Headache: Secondary | ICD-10-CM | POA: Diagnosis not present

## 2014-02-22 DIAGNOSIS — Z9049 Acquired absence of other specified parts of digestive tract: Secondary | ICD-10-CM | POA: Diagnosis not present

## 2014-02-22 DIAGNOSIS — R519 Headache, unspecified: Secondary | ICD-10-CM

## 2014-02-22 DIAGNOSIS — F419 Anxiety disorder, unspecified: Secondary | ICD-10-CM | POA: Insufficient documentation

## 2014-02-22 DIAGNOSIS — Z8719 Personal history of other diseases of the digestive system: Secondary | ICD-10-CM | POA: Diagnosis not present

## 2014-02-22 DIAGNOSIS — R109 Unspecified abdominal pain: Secondary | ICD-10-CM | POA: Diagnosis present

## 2014-02-22 DIAGNOSIS — R1084 Generalized abdominal pain: Secondary | ICD-10-CM | POA: Diagnosis not present

## 2014-02-22 LAB — CBG MONITORING, ED: Glucose-Capillary: 205 mg/dL — ABNORMAL HIGH (ref 70–99)

## 2014-02-22 MED ORDER — METOCLOPRAMIDE HCL 5 MG/ML IJ SOLN
10.0000 mg | Freq: Once | INTRAMUSCULAR | Status: AC
  Administered 2014-02-22: 10 mg via INTRAVENOUS
  Filled 2014-02-22: qty 2

## 2014-02-22 MED ORDER — SODIUM CHLORIDE 0.9 % IV BOLUS (SEPSIS)
1000.0000 mL | Freq: Once | INTRAVENOUS | Status: AC
  Administered 2014-02-22: 1000 mL via INTRAVENOUS

## 2014-02-22 MED ORDER — KETOROLAC TROMETHAMINE 30 MG/ML IJ SOLN
30.0000 mg | Freq: Once | INTRAMUSCULAR | Status: AC
  Administered 2014-02-22: 30 mg via INTRAVENOUS
  Filled 2014-02-22: qty 1

## 2014-02-22 MED ORDER — DIPHENHYDRAMINE HCL 50 MG/ML IJ SOLN
25.0000 mg | Freq: Once | INTRAMUSCULAR | Status: AC
Start: 2014-02-22 — End: 2014-02-22
  Administered 2014-02-22: 25 mg via INTRAVENOUS
  Filled 2014-02-22: qty 1

## 2014-02-22 NOTE — ED Notes (Signed)
Pt. Sated, i started having stomach pain, body aches, headache ,a and back pain on ?Wednesday. My doctor stated, its not stress and I think its stress.

## 2014-02-22 NOTE — Discharge Instructions (Signed)
°Emergency Department Resource Guide °1) Find a Doctor and Pay Out of Pocket °Although you won't have to find out who is covered by your insurance plan, it is a good idea to ask around and get recommendations. You will then need to call the office and see if the doctor you have chosen will accept you as a new patient and what types of options they offer for patients who are self-pay. Some doctors offer discounts or will set up payment plans for their patients who do not have insurance, but you will need to ask so you aren't surprised when you get to your appointment. ° °2) Contact Your Local Health Department °Not all health departments have doctors that can see patients for sick visits, but many do, so it is worth a call to see if yours does. If you don't know where your local health department is, you can check in your phone book. The CDC also has a tool to help you locate your state's health department, and many state websites also have listings of all of their local health departments. ° °3) Find a Walk-in Clinic °If your illness is not likely to be very severe or complicated, you may want to try a walk in clinic. These are popping up all over the country in pharmacies, drugstores, and shopping centers. They're usually staffed by nurse practitioners or physician assistants that have been trained to treat common illnesses and complaints. They're usually fairly quick and inexpensive. However, if you have serious medical issues or chronic medical problems, these are probably not your best option. ° °No Primary Care Doctor: °- Call [facility] at  832-8000 - they can help you locate a primary care doctor that  accepts your insurance, provides certain services, etc. °- Physician Referral Service- 1-800-533-3463 ° °Chronic Pain Problems: °Organization         Address  Phone   Notes  °[facility]  (336) 297-2271 Patients need to be referred by their primary care doctor.  ° °Medication  Assistance: °Organization         Address  Phone   Notes  °[place] [facility] [address] °[place], [place] [address] (336) 641-8030 --Must be a resident of [place] °-- Must have NO insurance coverage whatsoever (no Medicaid/ Medicare, etc.) °-- The pt. MUST have a primary care doctor that directs their care regularly and follows them in the community °  °[facility]  (866) 331-1348   °[employer]  (888) 892-1162   ° °Agencies that provide inexpensive medical care: °Organization         Address  Phone   Notes  °[facility]  (336) 832-8035   °[facility]    (336) 832-7272   °[facility] [address] °[place], [place] [address] (336) 832-4777   °[facility] [address], °[place] (336) 271-4999   °[facility]    (336) 373-0678   °[facility]    (336) 272-1050   °[facility] ° [address],  Phone:  (336) 832-4444, Fax:  (336) 832-4440 Hours of Operation:  9 am - 6 pm, M-F.  Also accepts Medicaid/Medicare and self-pay.  °[facility] ° [address],  Phone: (336) 832-3150, Fax: (336) 832-3151. Hours of Operation:  8:30 am - 5:30 pm, M-F.  Also accepts Medicaid and self-pay.  °[facility] [place] [address], [place] Phone: (336) 878-6027   °[facility] [address], [place], [place] (336)723-1848, Ext. 123 Mondays & Thursdays: 7-9 AM.  First 15 patients are seen on a first come, first serve basis. °  ° °Medicaid-accepting [place] Providers: ° °Organization         Address  Phone   Notes  °[facility] [address], [place] (336) 641-2100 Also accepts self-pay patients.  °[facility] [address], [place] ° (336) 856-9996   °[facility] [address] [place] [address], [place]  (336) 288-8857   °[facility] [address] [place] [address], [place] (336) 299-7000   °Veita Bland [address], [place]  ° (336) 373-1557 Only accepts [place] Access Medicaid patients after they have their name applied to their card.  ° °Self-Pay (no insurance) in [place]: ° °Organization         Address  Phone   Notes  °Sickle Cell Patients, [facility] [address], [place] (336) 832-1970   °[facility] [address], [place] (336) 832-4400   °[facility] ° [address], [place] (336) 992-4800   °[facility]/Dr. Osei-Bonsu ° [address] [place] [address], [place] or [address], [place] (336) 841-8500 Phone number for both [place] and [place] locations is the same.  °[facility] [address], [place] (336) 299-0000   °[facility] [address] [place] [address], [place] or [address] (336) 852-7530 °(336) 878-2260   °[facility] [address], [place] (336) 350-1642, phone; (336) 294-5005, fax Sees patients 1st and 3rd Saturday of every month.  Must not qualify for public or private insurance (i.e. Medicaid, Medicare, [place] Health Choice, Veterans' Benefits) • Household income should be no more than 200% of the poverty level •The clinic cannot treat you if you are pregnant or think you are pregnant • Sexually transmitted diseases are not treated at the clinic.  ° ° °Dental Care: °Organization         Address  Phone  Notes  °[place] [facility] [address], [place] (336) 641-6152 Accepts children up to age 21 who are enrolled in Medicaid or [place] Health Choice; pregnant women with a Medicaid card; and children who have applied for Medicaid or [place] Health Choice, but were declined, whose parents can pay a reduced fee at time of service.  °[place]  [facility] [place]  [address], [place] (336) 641-7733 Accepts children up to age 21 who are enrolled in Medicaid or [place] Health Choice; pregnant women with a Medicaid card; and children who have applied for Medicaid or [place] Health Choice, but were declined, whose parents can pay a reduced fee at time of service.  °[facility] ° [address], [place] (336) 641-4533 Patients are seen by appointment only. Walk-ins are not accepted. [facility] will see patients 18 years of age and older. °Monday - Tuesday (8am-5pm) °Most Wednesdays (8:30-5pm) °$30 per visit, cash only  °[facility] ° [address], [place] (336) 641-4533 Patients are seen by appointment only. Walk-ins are not accepted. [facility] will see patients 18 years of age and older. °One   Wednesday Evening (Monthly: Volunteer Based).  $30 per visit, cash only  °[facility]  (919) 537-3737 for adults; Children under age 4, call Graduate Pediatric Dentistry at (919) [phone]. Children aged 4-14, please call (919) [phone] to request a pediatric application. ° Dental services are provided in all areas of dental care including fillings, crowns and bridges, complete and partial dentures, implants, gum treatment, root canals, and extractions. Preventive care is also provided. Treatment is provided to both adults and children. °Patients are selected via a lottery and there is often a waiting list. °  °[facility] [address], °[place] ° (336) 763-8833 [email or web address] °  °[facility] [address], [place], [place] (336)723-1848, Ext. 123 Second and Fourth Thursday of each month, opens at 6:30 AM; Clinic ends at 9 AM.  Patients are seen on a first-come first-served basis, and a limited number are seen during each clinic.  ° °[facility] ° [address], [place], [place] (336) 723-7904    Eligibility Requirements °You must have lived in [place], [place], or [place] counties for at least the last three months. °  You cannot be eligible for state or federal sponsored healthcare insurance, including Veterans Administration, Medicaid, or Medicare. °  You generally cannot be eligible for healthcare insurance through your employer.  °  How to apply: °Eligibility screenings are held every Tuesday and Wednesday afternoon from 1:00 pm until 4:00 pm. You do not need an appointment for the interview!  °[facility] [address], [place], [place] 336-631-2330   °[facility]  336-342-8273   °[facility]  336-703-3100   °[facility]  336-570-6415   ° °Behavioral Health Resources in the Community: °Intensive Outpatient Programs °Organization         Address  Phone  Notes  °[facility] [address], [place], [place] 336-878-6098   °[facility] [address], [place], [place] 336-832-9800   °[facility] [address], [place], [place] ° 336-882-2125   °[facility] [address],  °[place], [place] 1-800-853-5163 or 336-641-4981   °Substance Abuse Resources °Organization         Address  Phone  Notes  °[facility]  336-882-2125   °[facility]  336-784-9470   °[facility]  336-285-9073   °[facility]  336-845-3988   °Residential & Outpatient Substance Abuse Program  1-800-659-3381   °Psychological Services °Organization         Address  Phone  Notes  °[facility]  336- 832-9600   °[facility]  336- 378-7881   °[facility] [address], [place] 1-800-853-5163 or 336-641-4981   ° °Mobile Crisis Teams °Organization         Address  Phone  Notes  °[facility], Mobile Crisis Care Unit  1-877-626-1772   °Assertive °[facility] ° [address]  [place], [place] 336-834-9664   °Sharon DeEsch [address] °[place] 336-554-5454   ° °Self-Help/Support Groups °Organization         Address  Phone             Notes  °[facility] [place] [facility] - variety of support groups  336- 373-1402 Call for more information  °Narcotics Anonymous (NA), [facility] [address], °[place]   2 meetings at this location  ° °  Residential Treatment Programs °Organization         Address  Phone  Notes  °[facility] [address],    °[place]  1-866-801-8205   °[facility] ° [address], [place], [place] 704-293-8524   °[facility] [address], [place] 336-845-3988 Admissions: 8am-3pm M-F  °[facility] [address]    °[place], [place] 336-841-1104   °[facility] [address], [place], [place] 336-379-7146   °[facility] [address]  °[place], [place] 336-285-9073   °[facility] - Intensive Outpatient [address], [place], [place] 336-852-3033   °[facility] [address]  °[place], [place] 1-877-615-2722 or 336-784-9470   °[facility] [address] [place], [place] 336-227-7417 Accepts Medicaid  °[facility] [address]  °[place] 1-800-659-3381 Substance Abuse/Addiction Treatment  ° °[place] Behavioral Health Resources °Organization         Address  Phone  Notes  °[facility]  (888) 581-9988   °Julie Brannon, PhD [address] [place], [place]   (336) 349-5553 or (336) 951-0000   °[facility]   [address] °[place], [place] (336) 349-4454   °[facility] [address], [place], [place] (336) 342-8316 Insurance/Medicaid/sponsorship through [facility]  °[facility] [address]                                    [place], [place] (336) 342-8316 Therapy/tele-psych/case    °[facility] [address]  ° [place], [place] (336) 349-2233    °Dr. Arfeen  (336) 349-4544   °[facility] [place]  [employer] [place] [facility] 1) [address],  °2) [address], [place] °3)  [address], [place] (336) 349-3220 °(336) 342-7768 ° °(336) 342-8140   °[place] [facility] (336) 342-1394 or (336) 342-3537 (After Hours)    ° ° °

## 2014-02-22 NOTE — ED Provider Notes (Signed)
CSN: [id number]     Arrival date & time 02/22/14  1001 History   First MD Initiated Contact with Patient 02/22/14 1012     Chief Complaint  Patient presents with  . Abdominal Pain  . Headache  . Back Pain     (Consider location/radiation/quality/duration/timing/severity/associated sxs/prior Treatment) HPI   29 year old male with history of insulin-dependent diabetes, anxiety, depression, PTSD who presents complaining of abdominal pain and headache. Patient reported he has been doing with a lot of stress including a custody battle, work-related stress, and living situation and for the past 3-4 days he has been having generalized body aches, throbbing headache to his right forehead, epigastric abdominal pain, and occasional bouts of nonbloody non-mucousy diarrhea. Patient attributed his abdominal pain to stress ulcer which he normally developed and he stressed out. She states he has had several workup for this abdominal pain including endoscopy that confirmed stress ulcers. Describe his abdominal pain as a burning sensation. He has tried taking tramadol and Tylenol with some relief. Headache is gradual onset and persistent without any associated fever, chills, vision changes, runny nose, sneezing and coughing. He denies any chest pain or short of breath, denies any dysuria or hematuria. Patient with history of diabetes not on insulin pump states that his diabetes has been well controlled. He denies any recent medication changes. Patient is a nonsmoker and does not drink alcohol. He denies any SI or HI.  Past Medical History  Diagnosis Date  . Stomach ulcer   . Proteinuria   . Anxiety   . Depression   . PTSD (post-traumatic stress disorder)   . Type I diabetes mellitus   . Diabetic peripheral neuropathy   . Hematemesis/vomiting blood admissions 09/2013, 01/02/2014   Past Surgical History  Procedure Laterality Date  . Appendectomy    . Esophagogastroduodenoscopy N/A 01/03/2014    Procedure:  ESOPHAGOGASTRODUODENOSCOPY (EGD);  Surgeon: Barrie Folk, MD;  Location: [facility] END[facility];  Service: Endoscopy;  Laterality: N/A;   Family History  Problem Relation Age of Onset  . HIV/AIDS Father   . Drug abuse Father   . Suicidality Neg Hx   . Bipolar disorder Neg Hx   . Depression Neg Hx   . Anxiety disorder Neg Hx   . Drug abuse Sister    History  Substance Use Topics  . Smoking status: Never Smoker   . Smokeless tobacco: Never Used  . Alcohol Use: No    Review of Systems  All other systems reviewed and are negative.     Allergies  Banana and Ibuprofen  Home Medications   Prior to Admission medications   Medication Sig Start Date End Date Taking? Authorizing Provider  gabapentin (NEURONTIN) 300 MG capsule Take 300 mg by mouth 3 (three) times daily.    Historical Provider, MD  hydrOXYzine (ATARAX/VISTARIL) 25 MG tablet Take 1 tablet (25 mg total) by mouth every 8 (eight) hours as needed for anxiety. 11/21/13   Oletta Darter, MD  insulin glargine (LANTUS) 100 UNIT/ML injection Inject 15 Units into the skin at bedtime.     Historical Provider, MD  pantoprazole (PROTONIX) 40 MG tablet Take 40 mg by mouth 2 (two) times daily.     Historical Provider, MD  sertraline (ZOLOFT) 50 MG tablet Take 0.5 tablets (25 mg total) by mouth daily. 11/21/13   Oletta Darter, MD   BP 104/70 mmHg  Pulse 89  Temp(Src) 98.3 F (36.8 C) (Oral)  Resp 17  Ht  (1.676 m)  Wt 156 lb (  70.761 kg)  BMI 25.19 kg/m2  SpO2 97% Physical Exam  Constitutional: He is oriented to person, place, and time. He appears well-developed and well-nourished. No distress.  Well-appearing African-American male appears to be in no acute distress, nontoxic in appearance  HENT:  Head: Atraumatic.  Right Ear: External ear normal.  Left Ear: External ear normal.  Mouth/Throat: Oropharynx is clear and moist.  Eyes: Conjunctivae are normal. Pupils are equal, round, and reactive to light.  Neck: Normal range of  motion. Neck supple.  No nuchal rigidity  Cardiovascular: Normal rate and regular rhythm.   Pulmonary/Chest: Effort normal and breath sounds normal. No respiratory distress. He has no wheezes.  Abdominal: Soft. There is tenderness (Mild suprapubic tenderness without guarding or rebound tenderness.).  Musculoskeletal:  5/5 strength to all 4 extremities  Neurological: He is alert and oriented to person, place, and time. GCS eye subscore is 4. GCS verbal subscore is 5. GCS motor subscore is 6.  Skin: No rash noted.  Psychiatric: He has a normal mood and affect.    ED Course  Procedures (including critical care time)  10:38 AM Patient complains of headache, no red flags. No acute onset thunderclap headache concerning for subarachnoid hemorrhage, no fever or nuchal rigidity concerning for meningitis, and no focal neuro deficit concerning for stroke. He also complaining of generalized body aches. No focal point tenderness on exam and no URI symptoms. He reported having epigastric abdominal pain and related to stress ulcer. He has a nonsurgical abdomen. He is also afebrile with stable normal vital signs. Plan to treat his symptoms but I suspect patient will be stable to follow-up with his PCP for further care. I have low suspicion for acute emergent medical condition at this time.    1:08 PM  After receiving a migraine cocktail, patient states he felt much better, general body aches has resolved. Patient felt comfortable returning home and go to work tonight. He will follow-up with his PCP as needed. Return precautions discussed.  Labs Review Labs Reviewed - No data to display  Imaging Review No results found.   EKG Interpretation None      MDM   Final diagnoses:  Generalized abdominal pain  Nonintractable headache, unspecified chronicity pattern, unspecified headache type    BP 132/78 mmHg  Pulse 77  Temp(Src) 98.3 F (36.8 C) (Oral)  Resp 14  Ht 5\' 6"  (1.676 m)  Wt 156 lb  (70.761 kg)  BMI 25.19 kg/m2  SpO2 98%     Fayrene HelperBowie Tahjae Clausing, PA-C 02/22/14 1308  Suzi RootsKevin E Steinl, MD 02/23/14 [phone]

## 2014-02-22 NOTE — ED Notes (Signed)
Pt verbalized understanding of discharge instructions and follow up with pcp, no further questions.

## 2014-02-22 NOTE — ED Notes (Signed)
PA in room

## 2014-06-19 ENCOUNTER — Telehealth: Payer: Self-pay | Admitting: Endocrinology

## 2014-06-19 ENCOUNTER — Encounter: Payer: Self-pay | Admitting: Endocrinology

## 2014-06-19 ENCOUNTER — Ambulatory Visit (INDEPENDENT_AMBULATORY_CARE_PROVIDER_SITE_OTHER): Payer: No Typology Code available for payment source | Admitting: Endocrinology

## 2014-06-19 VITALS — BP 132/88 | HR 84 | Temp 98.2°F | Ht 66.0 in | Wt 156.0 lb

## 2014-06-19 DIAGNOSIS — E1021 Type 1 diabetes mellitus with diabetic nephropathy: Secondary | ICD-10-CM

## 2014-06-19 LAB — HEMOGLOBIN A1C: Hgb A1c MFr Bld: 9 % — ABNORMAL HIGH (ref 4.6–6.5)

## 2014-06-19 MED ORDER — INSULIN GLARGINE 100 UNIT/ML ~~LOC~~ SOLN: 15.0000 [IU] | SUBCUTANEOUS | Status: DC

## 2014-06-19 NOTE — Patient Instructions (Addendum)
good diet and exercise significantly improve the control of your diabetes.  please let me know if you wish to be referred to a dietician.  high blood sugar is very risky to your health.  you should see an eye doctor and dentist every year.  It is very important to get all recommended vaccinations.  controlling your blood pressure and cholesterol drastically reduces the damage diabetes does to your body.  Those who smoke should quit.  please discuss these with your doctor.  check your blood sugar 4 times a day.  vary the time of day when you check, between before the 3 meals, and at bedtime.  also check if you have symptoms of your blood sugar being too high or too low.  please keep a record of the readings and bring it to your next appointment here.  You can write it on any piece of paper.  please call us sooner if your blood sugar goes below 70, or if you have a lot of readings over 200. blood tests are requested for you today.  We'll let you know about the results.   Please ask if Dr Lucianne MussKumar could be your primary doctor.  If so, he would be happy to take care of your diabetes also.   Please see Bonita QuinLinda, to consider an insulin pump.

## 2014-06-19 NOTE — Progress Notes (Signed)
Subjective:    Patient ID: Tyler [name], male    DOB: [birth year], 29 y.o.   MRN: [id number]  HPI pt states DM was dx'ed in 2010, when he presented with severe hyperglycemia; he has mild if any neuropathy of the lower extremities, but he has associated nephropathy; he has been on insulin since soon after dx; pt says his diet and exercise are; he has never had pancreatitis, severe hypoglycemia or DKA.  He takes lantus, 15 units qhs.  He says cbg's are well-controlled.   Past Medical History  Diagnosis Date  . Stomach ulcer   . Proteinuria   . Anxiety   . Depression   . PTSD (post-traumatic stress disorder)   . Type I diabetes mellitus   . Diabetic peripheral neuropathy   . Hematemesis/vomiting blood admissions 09/2013, 01/02/2014    Past Surgical History  Procedure Laterality Date  . Appendectomy    . Esophagogastroduodenoscopy N/A 01/03/2014    Procedure: ESOPHAGOGASTRODUODENOSCOPY (EGD);  Surgeon: Barrie FolkJohn C Hayes, MD;  Location: [facility];  Service: Endoscopy;  Laterality: N/A;    History   Social History  . Marital Status: Single    Spouse Name: N/A  . Number of Children: N/A  . Years of Education: N/A   Occupational History  . Not on file.   Social History Main Topics  . Smoking status: Never Smoker   . Smokeless tobacco: Never Used  . Alcohol Use: No  . Drug Use: No  . Sexual Activity: Yes   Other Topics Concern  . Not on file   Social History Narrative    Current Outpatient Prescriptions on File Prior to Visit  Medication Sig Dispense Refill  . gabapentin (NEURONTIN) 300 MG capsule Take 300 mg by mouth 3 (three) times daily.    . hydrOXYzine (ATARAX/VISTARIL) 25 MG tablet Take 1 tablet (25 mg total) by mouth every 8 (eight) hours as needed for anxiety. 90 tablet 1  . pantoprazole (PROTONIX) 40 MG tablet Take 40 mg by mouth 2 (two) times daily.     . sertraline (ZOLOFT) 50 MG tablet Take 0.5 tablets (25 mg total) by mouth daily. 30 tablet 1   No current  facility-administered medications on file prior to visit.    Allergies  Allergen Reactions  . Banana Anaphylaxis  . Ibuprofen Other (See Comments)    ulcers    Family History  Problem Relation Age of Onset  . HIV/AIDS Father   . Drug abuse Father   . Suicidality Neg Hx   . Bipolar disorder Neg Hx   . Depression Neg Hx   . Anxiety disorder Neg Hx   . Drug abuse Sister   . Diabetes Cousin     BP 132/88 mmHg  Pulse 84  Temp(Src) 98.2 F (36.8 C) (Oral)  Ht 5\' 6"  (1.676 m)  Wt 156 lb (70.761 kg)  BMI 25.19 kg/m2  SpO2 96%  Review of Systems denies weight loss, chest pain, sob, urinary frequency, excessive diaphoresis, cold intolerance, rhinorrhea, and easy bruising.  He has chronic abd pain, blurry vision, lightheadedness, headache, and myalgias.  He says depression is well-controlled.      Objective:   Physical Exam VS: see vs page GEN: no distress HEAD: head: no deformity eyes: no periorbital swelling, no proptosis external nose and ears are normal mouth: no lesion seen NECK: supple, thyroid is not enlarged CHEST WALL: no deformity LUNGS: clear to auscultation BREASTS:  No gynecomastia CV: reg rate and rhythm, no murmur ABD: abdomen is  soft, nontender.  no hepatosplenomegaly.  not distended.  no hernia MUSCULOSKELETAL: muscle bulk and strength are grossly normal.  no obvious joint swelling.  gait is normal and steady EXTEMITIES: no deformity.  no ulcer on the feet.  feet are of normal color and temp.  no edema PULSES: dorsalis pedis intact bilat.  no carotid bruit NEURO:  cn 2-12 grossly intact.   readily moves all 4's.  sensation is intact to touch on the feet SKIN:  Normal texture and temperature.  No rash or suspicious lesion is visible.  Skin on the feet is dry. NODES:  None palpable at the neck PSYCH: alert, well-oriented.  Does not appear anxious nor depressed.  Lab Results  Component Value Date   HGBA1C 9.0* 06/19/2014   i personally reviewed  electrocardiogram tracing (01/06/14): normal  i personally reviewed Dr Rinaldo Cloud progress note from may, 2016.  He noted pt had detectable C-peptide, and DM was poorly-controlled.      Assessment & Plan:  Type 1 DM: new to me.  Severe exacerbation Depression: well-controlled.  Pt is advised to continue thic control, as this control also helps care of DM.     Patient is advised the following: Patient Instructions  good diet and exercise significantly improve the control of your diabetes.  please let me know if you wish to be referred to a dietician.  high blood sugar is very risky to your health.  you should see an eye doctor and dentist every year.  It is very important to get all recommended vaccinations.  controlling your blood pressure and cholesterol drastically reduces the damage diabetes does to your body.  Those who smoke should quit.  please discuss these with your doctor.  check your blood sugar 4 times a day.  vary the time of day when you check, between before the 3 meals, and at bedtime.  also check if you have symptoms of your blood sugar being too high or too low.  please keep a record of the readings and bring it to your next appointment here.  You can write it on any piece of paper.  please call us sooner if your blood sugar goes below 70, or if you have a lot of readings over 200. blood tests are requested for you today.  We'll let you know about the results.   Please ask if Dr Lucianne Muss could be your primary doctor.  If so, he would be happy to take care of your diabetes also.   Please see Bonita Quin, to consider an insulin pump.    addendum: please increase lantus to 20 units qd.

## 2014-06-19 NOTE — Telephone Encounter (Signed)
Dr. Lucianne MussKumar pt saw Dr. Everardo AllEllison today as new pt, per pt and Dr. Everardo AllEllison they requested you be pt PCP and specialist . Pt had blood work done today and I made apt with Bonita QuinLinda. Will you please review and advise on you taking pt over. If you accept when would you like his return apt to be.

## 2014-06-19 NOTE — Telephone Encounter (Signed)
I can only be managing his diabetes, he will need to find a PCP separately. If he comes to see me will need at least 30 minutes for the visit

## 2014-07-01 ENCOUNTER — Ambulatory Visit: Payer: Self-pay | Admitting: Nutrition

## 2015-01-28 ENCOUNTER — Encounter: Payer: Self-pay | Admitting: Endocrinology

## 2015-01-28 ENCOUNTER — Ambulatory Visit (INDEPENDENT_AMBULATORY_CARE_PROVIDER_SITE_OTHER): Payer: No Typology Code available for payment source | Admitting: Endocrinology

## 2015-01-28 VITALS — BP 132/88 | HR 74 | Temp 98.1°F | Ht 66.0 in | Wt 155.0 lb

## 2015-01-28 DIAGNOSIS — Z23 Encounter for immunization: Secondary | ICD-10-CM | POA: Diagnosis not present

## 2015-01-28 DIAGNOSIS — E1021 Type 1 diabetes mellitus with diabetic nephropathy: Secondary | ICD-10-CM | POA: Diagnosis not present

## 2015-01-28 LAB — POCT GLYCOSYLATED HEMOGLOBIN (HGB A1C): HEMOGLOBIN A1C: 9.1

## 2015-01-28 MED ORDER — INSULIN GLARGINE 100 UNIT/ML ~~LOC~~ SOLN: 20.0000 [IU] | SUBCUTANEOUS | Status: DC

## 2015-01-28 MED ORDER — INSULIN GLARGINE 100 UNIT/ML ~~LOC~~ SOLN: 20.0000 [IU] | Freq: Every day | SUBCUTANEOUS | Status: DC

## 2015-01-28 NOTE — Progress Notes (Signed)
Subjective:    Patient ID: Tyler [name], male    DOB: 24-Mar-1985, 30 y.o.   MRN: [id number]  HPI  Pt returns for f/u of diabetes mellitus: DM type: 1 Dx'ed: 2010 Complications: nephropathy Therapy: insulin since soon after dx DKA: never Severe hypoglycemia: never Pancreatitis: never Other: he declines multiple daily injections; he works 3rd shift. Interval history: He takes just 15 units qd.  no cbg record, but states cbg's vary from 75-121.  It is lowest in the middle of the night, when he works.  He says depression and anxiety continue to interfere with his ability to manage his DM, but he says he never misses the insulin.  He declines pump rx for now.   Past Medical History  Diagnosis Date  . Stomach ulcer   . Proteinuria   . Anxiety   . Depression   . PTSD (post-traumatic stress disorder)   . Type I diabetes mellitus (HCC)   . Diabetic peripheral neuropathy (HCC)   . Hematemesis/vomiting blood admissions 09/2013, 01/02/2014    Past Surgical History  Procedure Laterality Date  . Appendectomy    . Esophagogastroduodenoscopy N/A 01/03/2014    Procedure: ESOPHAGOGASTRODUODENOSCOPY (EGD);  Surgeon: Barrie Folk, MD;  Location: [facility] ENDOSCOPY;  Service: Endoscopy;  Laterality: N/A;    Social History   Social History  . Marital Status: Single    Spouse Name: N/A  . Number of Children: N/A  . Years of Education: N/A   Occupational History  . Not on file.   Social History Main Topics  . Smoking status: Never Smoker   . Smokeless tobacco: Never Used  . Alcohol Use: No  . Drug Use: No  . Sexual Activity: Yes   Other Topics Concern  . Not on file   Social History Narrative    Current Outpatient Prescriptions on File Prior to Visit  Medication Sig Dispense Refill  . gabapentin (NEURONTIN) 300 MG capsule Take 300 mg by mouth 3 (three) times daily.    . hydrOXYzine (ATARAX/VISTARIL) 25 MG tablet Take 1 tablet (25 mg total) by mouth every 8 (eight) hours as needed for  anxiety. 90 tablet 1  . pantoprazole (PROTONIX) 40 MG tablet Take 40 mg by mouth 2 (two) times daily.     . sertraline (ZOLOFT) 50 MG tablet Take 0.5 tablets (25 mg total) by mouth daily. 30 tablet 1   No current facility-administered medications on file prior to visit.    Allergies  Allergen Reactions  . Banana Anaphylaxis  . Ibuprofen Other (See Comments)    ulcers    Family History  Problem Relation Age of Onset  . HIV/AIDS Father   . Drug abuse Father   . Suicidality Neg Hx   . Bipolar disorder Neg Hx   . Depression Neg Hx   . Anxiety disorder Neg Hx   . Drug abuse Sister   . Diabetes Cousin     BP 132/88 mmHg  Pulse 74  Temp(Src) 98.1 F (36.7 C) (Oral)  Ht 5\' 6"  (1.676 m)  Wt 155 lb (70.308 kg)  BMI 25.03 kg/m2  SpO2 98%  Review of Systems He denies LOC.      Objective:   Physical Exam VITAL SIGNS:  See vs page.   GENERAL: no distress.  Pulses: dorsalis pedis intact bilat.   MSK: no deformity of the feet.   CV: no leg edema.   Skin:  no ulcer on the feet.  normal color and temp on the feet.  Neuro: sensation is intact to touch on the feet.     A1c=9.1%    Assessment & Plan:  DM: he needs increased rx Occupational status: in this situation, he needs to vary the insulin according to his work schedule  Noncompliance with cbg recording: I'll work around this as best I can, but it is unlikely he can achieve good glycemic control.  Depression: this also affects his DM self-care.   Patient is advised the following: Patient Instructions  Please increase the lantus to 20 units on nights when you don't work, and 15 units on nights when you work. On this type of insulin schedule, you should eat meals on a regular schedule.  If a meal is missed or significantly delayed, your blood sugar could go low. check your blood sugar 4 times a day.  vary the time of day when you check, between before the 3 meals, and at bedtime.  also check if you have symptoms of your  blood sugar being too high or too low.  please keep a record of the readings and bring it to your next appointment here.  You can write it on any piece of paper.  please call us sooner if your blood sugar goes below 70, or if you have a lot of readings over 200. Please continue to work with behavioral health on your symptoms.  Controlling your symptoms will help your blood sugar.   Please come back for a follow-up appointment in 2 months.

## 2015-01-28 NOTE — Patient Instructions (Addendum)
Please increase the lantus to 20 units on nights when you don't work, and 15 units on nights when you work. On this type of insulin schedule, you should eat meals on a regular schedule.  If a meal is missed or significantly delayed, your blood sugar could go low. check your blood sugar 4 times a day.  vary the time of day when you check, between before the 3 meals, and at bedtime.  also check if you have symptoms of your blood sugar being too high or too low.  please keep a record of the readings and bring it to your next appointment here.  You can write it on any piece of paper.  please call us sooner if your blood sugar goes below 70, or if you have a lot of readings over 200. Please continue to work with behavioral health on your symptoms.  Controlling your symptoms will help your blood sugar.   Please come back for a follow-up appointment in 2 months.

## 2015-02-13 ENCOUNTER — Other Ambulatory Visit: Payer: Self-pay

## 2015-02-13 MED ORDER — INSULIN GLARGINE 100 UNIT/ML SOLOSTAR PEN: 20.0000 [IU] | PEN_INJECTOR | Freq: Every day | SUBCUTANEOUS | Status: DC

## 2015-02-15 ENCOUNTER — Ambulatory Visit (INDEPENDENT_AMBULATORY_CARE_PROVIDER_SITE_OTHER): Payer: No Typology Code available for payment source | Admitting: Physician Assistant

## 2015-02-15 ENCOUNTER — Telehealth: Payer: Self-pay | Admitting: Radiology

## 2015-02-15 VITALS — BP 120/70 | HR 78 | Temp 98.0°F | Resp 8 | Ht 66.0 in | Wt 144.2 lb

## 2015-02-15 DIAGNOSIS — J069 Acute upper respiratory infection, unspecified: Secondary | ICD-10-CM | POA: Diagnosis not present

## 2015-02-15 DIAGNOSIS — E1042 Type 1 diabetes mellitus with diabetic polyneuropathy: Secondary | ICD-10-CM | POA: Diagnosis not present

## 2015-02-15 DIAGNOSIS — E1110 Type 2 diabetes mellitus with ketoacidosis without coma: Secondary | ICD-10-CM

## 2015-02-15 DIAGNOSIS — Z794 Long term (current) use of insulin: Principal | ICD-10-CM

## 2015-02-15 DIAGNOSIS — G43009 Migraine without aura, not intractable, without status migrainosus: Secondary | ICD-10-CM | POA: Diagnosis not present

## 2015-02-15 DIAGNOSIS — B9789 Other viral agents as the cause of diseases classified elsewhere: Secondary | ICD-10-CM

## 2015-02-15 MED ORDER — GUAIFENESIN ER 1200 MG PO TB12: 1.0000 | ORAL_TABLET | Freq: Two times a day (BID) | ORAL | Status: AC

## 2015-02-15 MED ORDER — IPRATROPIUM BROMIDE 0.06 % NA SOLN: 2.0000 | Freq: Three times a day (TID) | NASAL | Status: DC

## 2015-02-15 MED ORDER — SUMATRIPTAN SUCCINATE 100 MG PO TABS
100.0000 mg | ORAL_TABLET | ORAL | Status: DC | PRN
Start: 2015-02-15 — End: 2016-08-27

## 2015-02-15 MED ORDER — HYDROCOD POLST-CPM POLST ER 10-8 MG/5ML PO SUER: 5.0000 mL | Freq: Two times a day (BID) | ORAL | Status: DC | PRN

## 2015-02-15 MED ORDER — BLOOD GLUCOSE MONITOR KIT: PACK | Status: DC

## 2015-02-15 MED ORDER — KETOROLAC TROMETHAMINE 60 MG/2ML IM SOLN
60.0000 mg | Freq: Once | INTRAMUSCULAR | Status: AC
  Administered 2015-02-15: 60 mg via INTRAMUSCULAR

## 2015-02-15 NOTE — Progress Notes (Addendum)
Tyler [name]  MRN: [id number] DOB: [birth year]  Subjective:  Pt presents to clinic with cold symptoms that he has had for that last 5 days - started with a sore throat that has gotten better and significant nasal congestion and cough both of which are keeping him up at night.  He works in a [employer] that is dusty.  He is also having a headache that is right sided behind his eye with photophobia and some intermittent nausea.  He has had headaches like this before but they have always been associated with a full moon but this one feels the same - he does not have other types of headaches.  When he gets the headaches with the full moon he feels like they last about 2 weeks and during that time he takes a 100 pill bottle of motrin both weeks and that barely touches the pain.  The pain is a throbbing.  When he has these headaches he works but it is really hard.  He has peripheral neuralgia from his DM type 1 and he has depression, anxiety and PTSD all of which are treated with medications.  Last glucose - months ago - unsure where his meter is DM - Dr Loanne Drilling who is new for him - ? Last A1C - Type 1 - 2012 - just on insulin Glargine  Patient Active Problem List   Diagnosis Date Noted  . Diabetes ([place]) 06/19/2014  . Generalized abdominal pain   . Upper GI bleed   . Intermittent explosive disorder 11/21/2013  . GAD (generalized anxiety disorder) 11/21/2013  . Major depressive disorder, recurrent episode, moderate ([place]) 11/21/2013  . PTSD (post-traumatic stress disorder) 11/21/2013    Current Outpatient Prescriptions on File Prior to Visit  Medication Sig Dispense Refill  . gabapentin (NEURONTIN) 300 MG capsule Take 300 mg by mouth 3 (three) times daily.    . Insulin Glargine (BASAGLAR KWIKPEN) 100 UNIT/ML Solostar Pen Inject 20 Units into the skin daily at 10 pm. 15 mL 2  . pantoprazole (PROTONIX) 40 MG tablet Take 40 mg by mouth 2 (two) times daily.     . sertraline (ZOLOFT) 50 MG tablet Take  0.5 tablets (25 mg total) by mouth daily. 30 tablet 1  . hydrOXYzine (ATARAX/VISTARIL) 25 MG tablet Take 1 tablet (25 mg total) by mouth every 8 (eight) hours as needed for anxiety. (Patient not taking: Reported on 02/15/2015) 90 tablet 1   No current facility-administered medications on file prior to visit.    Allergies  Allergen Reactions  . Banana Anaphylaxis  . Ibuprofen Other (See Comments)    Ulcers but pt does take gel caps and they do not bother his stomach    Review of Systems  Constitutional: Positive for chills. Negative for fever.  HENT: Positive for congestion, postnasal drip, rhinorrhea and sore throat (better now).   Eyes: Positive for photophobia.  Respiratory: Positive for cough. Negative for shortness of breath and wheezing.        No h/o asthma, nonsmoker  Gastrointestinal: Positive for nausea (intermittent) and diarrhea. Negative for vomiting.  Neurological: Positive for headaches.  Psychiatric/Behavioral: Positive for sleep disturbance.   Objective:  BP 120/70 mmHg  Pulse 78  Temp(Src) 98 F (36.7 C)  Resp 80  Ht _0  (1.676 m)  Wt 144 lb 3.2 oz (65.409 kg)  BMI 23.29 kg/m2  SpO2 98%  Physical Exam  Constitutional: He is oriented to person, place, and time and well-developed, well-nourished, and in no distress.  Pt  laying in a dark room   HENT:  Head: Normocephalic and atraumatic.  Right Ear: Hearing, tympanic membrane, external ear and ear canal normal.  Left Ear: Hearing, tympanic membrane, external ear and ear canal normal.  Nose: Mucosal edema (red and swollen) present.  Mouth/Throat: Uvula is midline, oropharynx is clear and moist and mucous membranes are normal.  Eyes: Conjunctivae and EOM are normal. Pupils are equal, round, and reactive to light.  photophobia  Neck: Normal range of motion.  Cardiovascular: Normal rate, regular rhythm and normal heart sounds.   Pulmonary/Chest: Effort normal and breath sounds normal. He has no wheezes.    Lymphadenopathy:       Head (right side): No tonsillar adenopathy present.       Head (left side): No tonsillar adenopathy present.    He has no cervical adenopathy.       Right: No supraclavicular adenopathy present.       Left: No supraclavicular adenopathy present.  Neurological: He is alert and oriented to person, place, and time. He has normal sensation, normal strength, normal reflexes and intact cranial nerves. He displays no weakness. Gait normal. Gait normal.  Skin: Skin is warm and dry.  Psychiatric: Mood, memory, affect and judgment normal.    Assessment and Plan :  Migraine without aura and without status migrainosus, not intractable - Plan: ketorolac (TORADOL) injection 60 mg, SUMAtriptan (IMITREX) 100 MG tablet - pt has never been evaluated but sounds like he has typical migraines that are associated with full moon - they last for a 2 week period during which he takes to much motrin esp with his history of upper GI bleed.  We will give toradol for the pain currently and we will start Imitrex for his future headaches - I discussed with him how to take and what to expect.  He will try this for the next headache cycle he gets - we talked about the fact that he has more than 4 headaches days a month and might need something daily to hep with this but he is already on a high dose of gabapentin for his peripheral neuropathy and that does not help with his headaches.  If the imitrex works but he has to take daily or if they do not work we will refer to neurology.  Viral URI with cough - Plan: ipratropium (ATROVENT) 0.06 % nasal spray, Guaifenesin (MUCINEX MAXIMUM STRENGTH) 1200 MG TB12, chlorpheniramine-HYDROcodone (TUSSIONEX PENNKINETIC ER) 10-8 MG/5ML SURE - symptomatic treatment discussed with the patient.  We talked about the fact that tussionex does have small amount of sugar so I gave him another meter so he can closely monitor his glucose -   Type 1 diabetes mellitus with diabetic  polyneuropathy ([place]) - Plan: blood glucose meter kit and supplies KIT- he currently cannot find his glucometer encouraged him to get this one and check his glucose at least once daily while on the cough medication - f/u with his endocrinologist  Tried to call patient to get him to return to clinic for a glucose check at 14:14.  LMOM and also called pharmacy but he had already picked up his Rx and left the pharmacy.  His A1C was 9.1 on 01/28/2015.  Called pt at 1649 and he stated his headache was resolved and he felt much better.  He was not able to get the glucometer because his insurance did not cover it but he plans to go and get one when he gets his paycheck this week.  He  will come in tomorrow and get a random glucose.  Windell Hummingbird PA-C  [facility] 02/15/2015 4:51 PM  Upon review of the chart it was noted that the patient's respiratory rate was 80 - that was incorrect - his respiratory rate was WNL in the room - he was having no respiratory distress and able to speak in full sentences.

## 2015-02-15 NOTE — Patient Instructions (Signed)
Check sugars with the cough syrup that I gave you - it is taken the least amount of times a day and it has the least amount of sugar.  Use the Imitrex for these headaches that you get - if it does not work or if it does and you have to take a lot please let me know and I will refer to neurology for further evaluation.

## 2015-02-15 NOTE — Telephone Encounter (Signed)
Left message for patient to come back in to have a glucose check,

## 2015-02-16 ENCOUNTER — Other Ambulatory Visit (INDEPENDENT_AMBULATORY_CARE_PROVIDER_SITE_OTHER): Payer: No Typology Code available for payment source | Admitting: *Deleted

## 2015-02-16 DIAGNOSIS — E1042 Type 1 diabetes mellitus with diabetic polyneuropathy: Secondary | ICD-10-CM

## 2015-02-16 LAB — GLUCOSE, POCT (MANUAL RESULT ENTRY): POC GLUCOSE: 182 mg/dL — AB (ref 70–99)

## 2015-02-17 ENCOUNTER — Encounter: Payer: Self-pay | Admitting: Physician Assistant

## 2015-02-17 NOTE — Progress Notes (Signed)
The respiratory rate on the record is 80. Hopefully this can be changed.

## 2015-03-27 ENCOUNTER — Ambulatory Visit: Payer: Self-pay | Admitting: Endocrinology

## 2015-03-30 ENCOUNTER — Ambulatory Visit (INDEPENDENT_AMBULATORY_CARE_PROVIDER_SITE_OTHER): Payer: No Typology Code available for payment source | Admitting: Endocrinology

## 2015-03-30 ENCOUNTER — Encounter: Payer: Self-pay | Admitting: Endocrinology

## 2015-03-30 VITALS — BP 122/80 | HR 86 | Temp 98.6°F | Ht 66.0 in | Wt 153.0 lb

## 2015-03-30 DIAGNOSIS — E1021 Type 1 diabetes mellitus with diabetic nephropathy: Secondary | ICD-10-CM | POA: Diagnosis not present

## 2015-03-30 LAB — POCT GLYCOSYLATED HEMOGLOBIN (HGB A1C): Hemoglobin A1C: 8.5

## 2015-03-30 MED ORDER — INSULIN GLARGINE 100 UNIT/ML SOLOSTAR PEN
20.0000 [IU] | PEN_INJECTOR | Freq: Every day | SUBCUTANEOUS | Status: DC
Start: 2015-03-30 — End: 2015-06-12

## 2015-03-30 MED ORDER — INSULIN LISPRO 100 UNIT/ML (KWIKPEN): 2.0000 [IU] | PEN_INJECTOR | Freq: Every day | SUBCUTANEOUS | Status: DC

## 2015-03-30 NOTE — Patient Instructions (Addendum)
Please continue the lantus to 20 units on nights when you don't work, and 15 units on nights when you work. Please also take 2 units of humalog if you eat a big meal.   On this type of insulin schedule, you should eat meals on a regular schedule.  If a meal is missed or significantly delayed, your blood sugar could go low. check your blood sugar 4 times a day.  vary the time of day when you check, between before the 3 meals, and at bedtime.  also check if you have symptoms of your blood sugar being too high or too low.  please keep a record of the readings and bring it to your next appointment here.  You can write it on any piece of paper.  please call us sooner if your blood sugar goes below 70, or if you have a lot of readings over 200. Please continue to work with behavioral health on your symptoms.  Controlling your symptoms will help your blood sugar.   Please come back for a follow-up appointment in 2 months.

## 2015-03-30 NOTE — Progress Notes (Signed)
Subjective:    Patient ID: Tyler [name], male    DOB: [birth year], 30 y.o.   MRN: [id number]  HPI Pt returns for f/u of diabetes mellitus: DM type: 1 Dx'ed: 2094 Complications: nephropathy Therapy: insulin since soon after dx DKA: never Severe hypoglycemia: never Pancreatitis: never Other: he declines multiple daily injections; he works 3rd shift; he declines pump rx for now  Interval history: no cbg record, but states cbg's are seldom low, and these episodes are mild.  This happens when he misses a meal.   Past Medical History  Diagnosis Date  . Stomach ulcer   . Proteinuria   . Anxiety   . Depression   . PTSD (post-traumatic stress disorder)   . Type I diabetes mellitus ([place])   . Diabetic peripheral neuropathy ([place])   . Hematemesis/vomiting blood admissions 09/2013, 01/02/2014    Past Surgical History  Procedure Laterality Date  . Appendectomy    . Esophagogastroduodenoscopy N/A 01/03/2014    Procedure: ESOPHAGOGASTRODUODENOSCOPY (EGD);  Surgeon: Missy Sabins, MD;  Location: [facility] ENDOSCOPY;  Service: Endoscopy;  Laterality: N/A;    Social History   Social History  . Marital Status: Single    Spouse Name: N/A  . Number of Children: N/A  . Years of Education: N/A   Occupational History  . Not on file.   Social History Main Topics  . Smoking status: Never Smoker   . Smokeless tobacco: Never Used  . Alcohol Use: No  . Drug Use: No  . Sexual Activity: Yes   Other Topics Concern  . Not on file   Social History Narrative    Current Outpatient Prescriptions on File Prior to Visit  Medication Sig Dispense Refill  . blood glucose meter kit and supplies KIT Dispense per patient insurance preference. Use up to four times daily as directed 1 each 0  . gabapentin (NEURONTIN) 300 MG capsule Take 300 mg by mouth 3 (three) times daily.    . hydrOXYzine (ATARAX/VISTARIL) 25 MG tablet Take 1 tablet (25 mg total) by mouth every 8 (eight) hours as needed for anxiety. 90  tablet 1  . ipratropium (ATROVENT) 0.06 % nasal spray Place 2 sprays into the nose 3 (three) times daily. 15 mL 0  . pantoprazole (PROTONIX) 40 MG tablet Take 40 mg by mouth 2 (two) times daily.     . SUMAtriptan (IMITREX) 100 MG tablet Take 1 tablet (100 mg total) by mouth every 2 (two) hours as needed for migraine. May repeat in 2 hours if headache persists or recurs. 10 tablet 0   No current facility-administered medications on file prior to visit.    Allergies  Allergen Reactions  . Banana Anaphylaxis  . Ibuprofen Other (See Comments)    Ulcers but pt does take gel caps and they do not bother his stomach    Family History  Problem Relation Age of Onset  . HIV/AIDS Father   . Drug abuse Father   . Suicidality Neg Hx   . Bipolar disorder Neg Hx   . Depression Neg Hx   . Anxiety disorder Neg Hx   . Drug abuse Sister   . Diabetes Cousin     BP 122/80 mmHg  Pulse 86  Temp(Src) 98.6 F (37 C) (Oral)  Ht _0  (1.676 m)  Wt 153 lb (69.4 kg)  BMI 24.71 kg/m2  SpO2 96%    Review of Systems He denies LOC    Objective:   Physical Exam VITAL SIGNS:  See vs  page GENERAL: no distress Pulses: dorsalis pedis intact bilat.   MSK: no deformity of the feet CV: no leg edema Skin:  no ulcer on the feet.  normal color and temp on the feet. Neuro: sensation is intact to touch on the feet    A1c=8.5%    Assessment & Plan:  DM: she needs increased rx Hypoglycemia, due to missing a meal  Patient is advised the following: Patient Instructions  Please continue the lantus to 20 units on nights when you don't work, and 15 units on nights when you work. Please also take 2 units of humalog if you eat a big meal.   On this type of insulin schedule, you should eat meals on a regular schedule.  If a meal is missed or significantly delayed, your blood sugar could go low. check your blood sugar 4 times a day.  vary the time of day when you check, between before the 3 meals, and at  bedtime.  also check if you have symptoms of your blood sugar being too high or too low.  please keep a record of the readings and bring it to your next appointment here.  You can write it on any piece of paper.  please call us sooner if your blood sugar goes below 70, or if you have a lot of readings over 200. Please continue to work with behavioral health on your symptoms.  Controlling your symptoms will help your blood sugar.   Please come back for a follow-up appointment in 2 months.

## 2015-03-31 DIAGNOSIS — E119 Type 2 diabetes mellitus without complications: Secondary | ICD-10-CM | POA: Insufficient documentation

## 2015-03-31 DIAGNOSIS — Z794 Long term (current) use of insulin: Secondary | ICD-10-CM

## 2015-04-01 ENCOUNTER — Telehealth: Payer: Self-pay | Admitting: Endocrinology

## 2015-04-01 MED ORDER — INSULIN ASPART 100 UNIT/ML FLEXPEN: 2.0000 [IU] | PEN_INJECTOR | Freq: Three times a day (TID) | SUBCUTANEOUS | Status: DC

## 2015-04-01 NOTE — Telephone Encounter (Signed)
Patient called stating that his insurance is no longer paying for the humalog   New Rx: Novolog   [facility] [address]    Thank you

## 2015-04-01 NOTE — Telephone Encounter (Signed)
Rx sent per pt's request.  

## 2015-04-02 ENCOUNTER — Other Ambulatory Visit: Payer: Self-pay

## 2015-05-20 LAB — HM DIABETES EYE EXAM

## 2015-05-29 ENCOUNTER — Ambulatory Visit: Payer: Self-pay | Admitting: Endocrinology

## 2015-06-12 ENCOUNTER — Other Ambulatory Visit: Payer: Self-pay

## 2015-06-19 ENCOUNTER — Encounter: Payer: Self-pay | Admitting: Endocrinology

## 2015-06-19 ENCOUNTER — Ambulatory Visit (INDEPENDENT_AMBULATORY_CARE_PROVIDER_SITE_OTHER): Payer: No Typology Code available for payment source | Admitting: Endocrinology

## 2015-06-19 VITALS — BP 127/71 | HR 75 | Temp 97.7°F | Resp 16 | Wt 149.4 lb

## 2015-06-19 DIAGNOSIS — E119 Type 2 diabetes mellitus without complications: Secondary | ICD-10-CM

## 2015-06-19 LAB — POCT GLYCOSYLATED HEMOGLOBIN (HGB A1C): HEMOGLOBIN A1C: 8.2

## 2015-06-19 NOTE — Patient Instructions (Addendum)
Please change the lantus to 22 units on nights when you don't work, and 13 units on nights when you work. Please also take 2 units of humalog if you eat a big meal.   On this type of insulin schedule, you should eat meals on a regular schedule.  If a meal is missed or significantly delayed, your blood sugar could go low. check your blood sugar 4 times a day.  vary the time of day when you check, between before the 3 meals, and at bedtime.  also check if you have symptoms of your blood sugar being too high or too low.  please keep a record of the readings and bring it to your next appointment here.  You can write it on any piece of paper.  please call us sooner if your blood sugar goes below 70, or if you have a lot of readings over 200.   Please come back for a follow-up appointment in 3 months.

## 2015-06-19 NOTE — Progress Notes (Signed)
Subjective:    Patient ID: Tyler [name], male    DOB: [birth year], 30 y.o.   MRN: [id number]  HPI Pt returns for f/u of diabetes mellitus: DM type: 1 Dx'ed: 6384 Complications: nephropathy Therapy: insulin since soon after dx.  DKA: never Severe hypoglycemia: never.  Pancreatitis: never Other: he declines multiple daily injections; he works 3rd shift; he declines pump rx for now  Interval history: no cbg record, but states he has mild hypoglycemia approx twice a week.  This happens at work, despite taking less insulin those nights.   Past Medical History  Diagnosis Date  . Stomach ulcer   . Proteinuria   . Anxiety   . Depression   . PTSD (post-traumatic stress disorder)   . Type I diabetes mellitus ([place])   . Diabetic peripheral neuropathy ([place])   . Hematemesis/vomiting blood admissions 09/2013, 01/02/2014    Past Surgical History  Procedure Laterality Date  . Appendectomy    . Esophagogastroduodenoscopy N/A 01/03/2014    Procedure: ESOPHAGOGASTRODUODENOSCOPY (EGD);  Surgeon: Missy Sabins, MD;  Location: [facility] ENDOSCOPY;  Service: Endoscopy;  Laterality: N/A;    Social History   Social History  . Marital Status: Single    Spouse Name: N/A  . Number of Children: N/A  . Years of Education: N/A   Occupational History  . Not on file.   Social History Main Topics  . Smoking status: Never Smoker   . Smokeless tobacco: Never Used  . Alcohol Use: No  . Drug Use: No  . Sexual Activity: Yes   Other Topics Concern  . Not on file   Social History Narrative    Current Outpatient Prescriptions on File Prior to Visit  Medication Sig Dispense Refill  . blood glucose meter kit and supplies KIT Dispense per patient insurance preference. Use up to four times daily as directed 1 each 0  . gabapentin (NEURONTIN) 300 MG capsule Take 300 mg by mouth 3 (three) times daily.    . hydrOXYzine (ATARAX/VISTARIL) 25 MG tablet Take 1 tablet (25 mg total) by mouth every 8 (eight) hours as  needed for anxiety. 90 tablet 1  . insulin aspart (NOVOLOG FLEXPEN) 100 UNIT/ML FlexPen Inject 2 Units into the skin 3 (three) times daily with meals. 15 mL 2  . Insulin Glargine (BASAGLAR KWIKPEN) 100 UNIT/ML SOPN Inject 22 Units into the skin at bedtime.     Marland Kitchen ipratropium (ATROVENT) 0.06 % nasal spray Place 2 sprays into the nose 3 (three) times daily. 15 mL 0  . pantoprazole (PROTONIX) 40 MG tablet Take 40 mg by mouth 2 (two) times daily.     . SUMAtriptan (IMITREX) 100 MG tablet Take 1 tablet (100 mg total) by mouth every 2 (two) hours as needed for migraine. May repeat in 2 hours if headache persists or recurs. 10 tablet 0   No current facility-administered medications on file prior to visit.    Allergies  Allergen Reactions  . Banana Anaphylaxis  . Ibuprofen Other (See Comments)    Ulcers but pt does take gel caps and they do not bother his stomach    Family History  Problem Relation Age of Onset  . HIV/AIDS Father   . Drug abuse Father   . Suicidality Neg Hx   . Bipolar disorder Neg Hx   . Depression Neg Hx   . Anxiety disorder Neg Hx   . Drug abuse Sister   . Diabetes Cousin     BP 127/71 mmHg  Pulse 75  Temp(Src) 97.7 F (36.5 C) (Oral)  Resp 16  Wt 149 lb 6 oz (67.756 kg)  SpO2 98%  Review of Systems Denies LOC    Objective:   Physical Exam VITAL SIGNS:  See vs page GENERAL: no distress Pulses: dorsalis pedis intact bilat.   MSK: no deformity of the feet CV: no leg edema Skin:  no ulcer on the feet.  normal color and temp on the feet. Neuro: sensation is intact to touch on the feet.   A1c=8.2%    Assessment & Plan:  DM: he needs increased rx. Occupational status: in this setting, he needs to further spread the difference between the insulin he takes on work nights vs non-work nights.    Patient is advised the following: Patient Instructions  Please change the lantus to 22 units on nights when you don't work, and 13 units on nights when you  work. Please also take 2 units of humalog if you eat a big meal.   On this type of insulin schedule, you should eat meals on a regular schedule.  If a meal is missed or significantly delayed, your blood sugar could go low. check your blood sugar 4 times a day.  vary the time of day when you check, between before the 3 meals, and at bedtime.  also check if you have symptoms of your blood sugar being too high or too low.  please keep a record of the readings and bring it to your next appointment here.  You can write it on any piece of paper.  please call us sooner if your blood sugar goes below 70, or if you have a lot of readings over 200.   Please come back for a follow-up appointment in 3 months.

## 2015-08-05 ENCOUNTER — Encounter: Payer: Self-pay | Admitting: Endocrinology

## 2015-09-05 ENCOUNTER — Emergency Department (HOSPITAL_COMMUNITY)
Admission: EM | Admit: 2015-09-05 | Discharge: 2015-09-05 | Disposition: A | Discharge status: Home / Self Care | Payer: No Typology Code available for payment source | Attending: Emergency Medicine | Admitting: Emergency Medicine

## 2015-09-05 ENCOUNTER — Encounter (HOSPITAL_COMMUNITY): Payer: Self-pay | Admitting: Emergency Medicine

## 2015-09-05 ENCOUNTER — Emergency Department (HOSPITAL_COMMUNITY): Payer: No Typology Code available for payment source

## 2015-09-05 DIAGNOSIS — Z79899 Other long term (current) drug therapy: Secondary | ICD-10-CM | POA: Insufficient documentation

## 2015-09-05 DIAGNOSIS — E109 Type 1 diabetes mellitus without complications: Secondary | ICD-10-CM | POA: Insufficient documentation

## 2015-09-05 DIAGNOSIS — R0789 Other chest pain: Secondary | ICD-10-CM | POA: Diagnosis not present

## 2015-09-05 MED ORDER — HYDROCODONE-ACETAMINOPHEN 5-325 MG PO TABS: 1.0000 | ORAL_TABLET | ORAL | 0 refills | Status: DC | PRN

## 2015-09-05 MED ORDER — OXYCODONE-ACETAMINOPHEN 5-325 MG PO TABS
1.0000 | ORAL_TABLET | Freq: Once | ORAL | Status: AC
  Administered 2015-09-05: 1 via ORAL
  Filled 2015-09-05: qty 1

## 2015-09-05 NOTE — ED Triage Notes (Signed)
Pt c/o L rib pain onset today while at wrestling practice. Pt speaking in short sentences d/t pain

## 2015-09-05 NOTE — ED Provider Notes (Signed)
[facility] DEPT Provider Note   CSN: [id number] Arrival date & time: 09/05/15  0045  First Provider Contact:  First MD Initiated Contact with Patient 09/05/15 0111     By signing my name below, I, Tyler [name], attest that this documentation has been prepared under the direction and in the presence of Charlann Lange, PA-C. Electronically Signed: Dora [name], Scribe. 09/05/2015. 1:11 AM.   History   Chief Complaint No chief complaint on file.   The history is provided by the patient. No language interpreter was used.     HPI Comments: Tyler [name] is a 30 y.o. male who presents to the Emergency Department complaining of sudden onset, constant, worsening, severe, left rib pain beginning two weeks ago. Pt reports he injured his left ribs while wrestling a couple of weeks ago; he states he landed on his left ribs while wrestling. Pt reports he was laying down tonight, felt a pop in his back, and his left rib pain became excruciating. He endorses left rib pain exacerbation with inspiration and expiration. Pt denies SOB or any other associated symptoms.   Past Medical History:  Diagnosis Date  . Anxiety   . Depression   . Diabetic peripheral neuropathy ([place])   . Hematemesis/vomiting blood admissions 09/2013, 01/02/2014  . Proteinuria   . PTSD (post-traumatic stress disorder)   . Stomach ulcer   . Type I diabetes mellitus [facility])     Patient Active Problem List   Diagnosis Date Noted  . Diabetes ([place]) 03/31/2015  . Generalized abdominal pain   . Upper GI bleed   . Intermittent explosive disorder 11/21/2013  . GAD (generalized anxiety disorder) 11/21/2013  . Major depressive disorder, recurrent episode, moderate ([place]) 11/21/2013  . PTSD (post-traumatic stress disorder) 11/21/2013    Past Surgical History:  Procedure Laterality Date  . APPENDECTOMY    . ESOPHAGOGASTRODUODENOSCOPY N/A 01/03/2014   Procedure: ESOPHAGOGASTRODUODENOSCOPY (EGD);  Surgeon: Missy Sabins, MD;   Location: [facility] ENDOSCOPY;  Service: Endoscopy;  Laterality: N/A;       Home Medications    Prior to Admission medications   Medication Sig Start Date End Date Taking? Authorizing Provider  BD PEN NEEDLE NANO U/F 32G X 4 MM MISC USE AS DIRECTED WITH BASAGLAR 06/12/15  Yes Historical Provider, MD  insulin aspart (NOVOLOG FLEXPEN) 100 UNIT/ML FlexPen Inject 2 Units into the skin 3 (three) times daily with meals. 04/01/15  Yes Renato Shin, MD  Insulin Glargine (BASAGLAR KWIKPEN) 100 UNIT/ML SOPN Inject 15-20 Units into the skin at bedtime.    Yes Historical Provider, MD  blood glucose meter kit and supplies KIT Dispense per patient insurance preference. Use up to four times daily as directed 02/15/15   Mancel Bale, PA-C  gabapentin (NEURONTIN) 300 MG capsule Take 300 mg by mouth 3 (three) times daily.    Historical Provider, MD  hydrOXYzine (ATARAX/VISTARIL) 25 MG tablet Take 1 tablet (25 mg total) by mouth every 8 (eight) hours as needed for anxiety. 11/21/13   Charlcie Cradle, MD  ipratropium (ATROVENT) 0.06 % nasal spray Place 2 sprays into the nose 3 (three) times daily. 02/15/15   Mancel Bale, PA-C  pantoprazole (PROTONIX) 40 MG tablet Take 40 mg by mouth 2 (two) times daily.     Historical Provider, MD  SUMAtriptan (IMITREX) 100 MG tablet Take 1 tablet (100 mg total) by mouth every 2 (two) hours as needed for migraine. May repeat in 2 hours if headache persists or recurs. 02/15/15   Mancel Bale,  PA-C    Family History Family History  Problem Relation Age of Onset  . HIV/AIDS Father   . Drug abuse Father   . Suicidality Neg Hx   . Bipolar disorder Neg Hx   . Depression Neg Hx   . Anxiety disorder Neg Hx   . Drug abuse Sister   . Diabetes Cousin     Social History Social History  Substance Use Topics  . Smoking status: Never Smoker  . Smokeless tobacco: Never Used  . Alcohol use No     Allergies   Banana; Food; and Ibuprofen   Review of Systems Review of Systems    Constitutional: Negative for fever.  Respiratory: Negative for cough and shortness of breath.        Complains of worse pain with breathing.  Cardiovascular: Positive for chest pain (left lower and left lateral chest pain).  Gastrointestinal: Negative for nausea and vomiting.  Genitourinary: Negative for flank pain and hematuria.  Musculoskeletal: Negative for back pain and neck pain.  Skin: Negative for color change and wound.  Neurological: Negative for weakness and light-headedness.     Physical Exam Updated Vital Signs BP 108/76 (BP Location: Left Arm)   Pulse 84   Temp 97.7 F (36.5 C) (Oral)   Resp 16   SpO2 97%   Physical Exam  Constitutional: He is oriented to person, place, and time. He appears well-developed and well-nourished. No distress.  HENT:  Head: Normocephalic and atraumatic.  Eyes: Conjunctivae and EOM are normal.  Neck: Neck supple. No tracheal deviation present.  Cardiovascular: Normal rate and regular rhythm.   Pulmonary/Chest: Effort normal. No respiratory distress. He exhibits tenderness (Left lower and lateral chest wall tenderness. ).  Decreased breath sounds on the left, although shallow respirations with splinting.  Abdominal: Soft. There is no tenderness.  Musculoskeletal: Normal range of motion.       Arms: Left ribs: no bruising. There is referred lower chest pain with palpation of the abdomen on the left. Distal pulses are intact.  Neurological: He is alert and oriented to person, place, and time.  Skin: Skin is warm and dry.  Psychiatric: He has a normal mood and affect. His behavior is normal.  Nursing note and vitals reviewed.    ED Treatments / Results  Labs (all labs ordered are listed, but only abnormal results are displayed) Labs Reviewed - No data to display  EKG  EKG Interpretation None       Radiology No results found. Dg Ribs Unilateral W/chest Left  Result Date: 09/05/2015 CLINICAL DATA:  Acute onset of severe  posterior left lower rib pain, after injury 2 weeks ago while wrestling. Initial encounter. EXAM: LEFT RIBS AND CHEST - 3+ VIEW COMPARISON:  None. FINDINGS: No displaced rib fractures are seen. The lungs are well-aerated and clear. There is no evidence of focal opacification, pleural effusion or pneumothorax. The cardiomediastinal silhouette is within normal limits. No acute osseous abnormalities are seen. IMPRESSION: No displaced rib fracture seen. No acute cardiopulmonary process identified. Electronically Signed   By: Garald Balding M.D.   On: 09/05/2015 01:44    Procedures Procedures (including critical care time)  DIAGNOSTIC STUDIES: Oxygen Saturation is 97% on RA, normal by my interpretation.    COORDINATION OF CARE: 1:14 AM Discussed treatment plan with pt at bedside and pt agreed to plan.   Medications Ordered in ED Medications  oxyCODONE-acetaminophen (PERCOCET/ROXICET) 5-325 MG per tablet 1 tablet (not administered)     Initial Impression /  Assessment and Plan / ED Course  I have reviewed the triage vital signs and the nursing notes.  Pertinent labs & imaging results that were available during my care of the patient were reviewed by me and considered in my medical decision making (see chart for details).  Clinical Course    Patient with chest wall injury 2 weeks ago, suddenly worse tonight after turning in bed. No new injury. No SOB, or cough but pain is worse with deep breathing. No abdominal pain or vomiting. Pain is worse with movement. CXR w/ribs negative for visualized fracture.   Normal vital signs without tachycardia, tachypnea or fever. Normal O2 saturation. Pain addressed. Discussed with Dr. Eulis Foster who feels the patient is stable for discharge home.   I personally performed the services described in this documentation, which was scribed in my presence. The recorded information has been reviewed and is accurate.    Final Clinical Impressions(s) / ED Diagnoses    Final diagnoses:  None   1. Chest wall pain  New Prescriptions New Prescriptions   No medications on file     Charlann Lange, Hershal Coria 09/05/15 0234    Daleen Bo, MD 09/05/15 [phone]

## 2015-09-05 NOTE — ED Notes (Signed)
Soft sling placed on L arm, pt states this stability feels much better. Pt also given an incentive spirometer with instructions

## 2015-09-09 ENCOUNTER — Encounter (HOSPITAL_COMMUNITY): Payer: Self-pay | Admitting: Emergency Medicine

## 2015-09-09 ENCOUNTER — Ambulatory Visit: Payer: No Typology Code available for payment source

## 2015-09-09 ENCOUNTER — Ambulatory Visit (HOSPITAL_COMMUNITY)
Admission: EM | Admit: 2015-09-09 | Discharge: 2015-09-09 | Disposition: A | Discharge status: Home / Self Care | Payer: No Typology Code available for payment source

## 2015-09-09 DIAGNOSIS — R0789 Other chest pain: Secondary | ICD-10-CM

## 2015-09-09 NOTE — ED Triage Notes (Signed)
Patient seen in emergency department  8/12.  Patient's employer does not want him at work .  Work place is requesting fmla papers to be completed.  Patient says he has rib injury/fractured ribs.  But patient has left arm in a sling-but reports no injury to left arm.

## 2015-09-09 NOTE — ED Provider Notes (Signed)
CSN: [id number]     Arrival date & time 09/09/15  1001 History   First MD Initiated Contact with Patient 09/09/15 1055     Chief Complaint  Patient presents with  . Letter for School/Work   (Consider location/radiation/quality/duration/timing/severity/associated sxs/prior Treatment) HPI 30 year old patient states that he fractured a rib while wrestling. He states that his job will not let him return until his rib is held. He is here for short-term disability paperwork to be completed. He was originally seen in the emergency department. X-rays from that visit did not demonstrate a rib fracture. Patient also has his arm and on sling on the left side. He states that there is nothing wrong with his left arm but it just makes his chest feel better to have it in a sling. Past Medical History:  Diagnosis Date  . Anxiety   . Depression   . Diabetic peripheral neuropathy ([place])   . Hematemesis/vomiting blood admissions 09/2013, 01/02/2014  . Proteinuria   . PTSD (post-traumatic stress disorder)   . Stomach ulcer   . Type I diabetes mellitus ([place])    Past Surgical History:  Procedure Laterality Date  . APPENDECTOMY    . ESOPHAGOGASTRODUODENOSCOPY N/A 01/03/2014   Procedure: ESOPHAGOGASTRODUODENOSCOPY (EGD);  Surgeon: Missy Sabins, MD;  Location: [facility] ENDOSCOPY;  Service: Endoscopy;  Laterality: N/A;   Family History  Problem Relation Age of Onset  . HIV/AIDS Father   . Drug abuse Father   . Drug abuse Sister   . Diabetes Cousin   . Suicidality Neg Hx   . Bipolar disorder Neg Hx   . Depression Neg Hx   . Anxiety disorder Neg Hx    Social History  Substance Use Topics  . Smoking status: Never Smoker  . Smokeless tobacco: Never Used  . Alcohol use No    Review of Systems  Denies: HEADACHE, NAUSEA, ABDOMINAL PAIN, CHEST PAIN, CONGESTION, DYSURIA, SHORTNESS OF BREATH  Allergies  Banana; Food; and Ibuprofen  Home Medications   Prior to Admission medications   Medication Sig Start  Date End Date Taking? Authorizing Provider  insulin aspart (NOVOLOG FLEXPEN) 100 UNIT/ML FlexPen Inject 2 Units into the skin 3 (three) times daily with meals. 04/01/15  Yes Renato Shin, MD  BD PEN NEEDLE NANO U/F 32G X 4 MM MISC USE AS DIRECTED WITH BASAGLAR 06/12/15   Historical Provider, MD  blood glucose meter kit and supplies KIT Dispense per patient insurance preference. Use up to four times daily as directed 02/15/15   Mancel Bale, PA-C  gabapentin (NEURONTIN) 300 MG capsule Take 300 mg by mouth 3 (three) times daily.    Historical Provider, MD  HYDROcodone-acetaminophen (NORCO/VICODIN) 5-325 MG tablet Take 1-2 tablets by mouth every 4 (four) hours as needed. 09/05/15   Charlann Lange, PA-C  hydrOXYzine (ATARAX/VISTARIL) 25 MG tablet Take 1 tablet (25 mg total) by mouth every 8 (eight) hours as needed for anxiety. 11/21/13   Charlcie Cradle, MD  Insulin Glargine (BASAGLAR KWIKPEN) 100 UNIT/ML SOPN Inject 15-20 Units into the skin at bedtime.     Historical Provider, MD  ipratropium (ATROVENT) 0.06 % nasal spray Place 2 sprays into the nose 3 (three) times daily. 02/15/15   Mancel Bale, PA-C  pantoprazole (PROTONIX) 40 MG tablet Take 40 mg by mouth 2 (two) times daily.     Historical Provider, MD  SUMAtriptan (IMITREX) 100 MG tablet Take 1 tablet (100 mg total) by mouth every 2 (two) hours as needed for migraine. May repeat in  2 hours if headache persists or recurs. 02/15/15   Mancel Bale, PA-C   Meds Ordered and Administered this Visit  Medications - No data to display  BP 139/79 (BP Location: Right Arm)   Pulse 81   Temp 98.2 F (36.8 C) (Oral)   Resp 18   SpO2 99%  No data found.   Physical Exam NURSES NOTES AND VITAL SIGNS REVIEWED. CONSTITUTIONAL: Well developed, well nourished, no acute distress HEENT: normocephalic, atraumatic EYES: Conjunctiva normal NECK:normal ROM, supple, no adenopathy PULMONARY:No respiratory distress, normal effort ABDOMINAL: Soft, ND, NT BS+, No  CVAT MUSCULOSKELETAL: Normal ROM of all extremities,  SKIN: warm and dry without rash PSYCHIATRIC: Mood and affect, behavior are normal  Urgent Care Course   Clinical Course    Procedures (including critical care time)  Labs Review Labs Reviewed - No data to display  Imaging Review No results found.   Visual Acuity Review  Right Eye Distance:   Left Eye Distance:   Bilateral Distance:    Right Eye Near:   Left Eye Near:    Bilateral Near:         MDM   1. Acute chest wall pain    Patient is advised that he needs to be followed at either his primary care provider or occupational health as we did not complete the paperwork that he is requiring at this time at this facility.    Konrad Felix, [place] 09/09/15 [phone]

## 2015-09-11 ENCOUNTER — Ambulatory Visit (INDEPENDENT_AMBULATORY_CARE_PROVIDER_SITE_OTHER): Payer: No Typology Code available for payment source | Admitting: Endocrinology

## 2015-09-11 ENCOUNTER — Encounter: Payer: Self-pay | Admitting: Endocrinology

## 2015-09-11 VITALS — BP 104/70 | HR 87 | Ht 66.0 in | Wt 155.0 lb

## 2015-09-11 DIAGNOSIS — E1021 Type 1 diabetes mellitus with diabetic nephropathy: Secondary | ICD-10-CM | POA: Diagnosis not present

## 2015-09-11 LAB — POCT GLYCOSYLATED HEMOGLOBIN (HGB A1C): Hemoglobin A1C: 7.7

## 2015-09-11 MED ORDER — BASAGLAR KWIKPEN 100 UNIT/ML ~~LOC~~ SOPN: 23.0000 [IU] | PEN_INJECTOR | Freq: Every day | SUBCUTANEOUS | 11 refills | Status: DC

## 2015-09-11 NOTE — Patient Instructions (Addendum)
Please change the lantus to 23 units on nights when you don't work, and 12 units on nights when you work. Please also take 2 units of humalog if you eat a big meal.   On this type of insulin schedule, you should eat meals on a regular schedule.  If a meal is missed or significantly delayed, your blood sugar could go low.  check your blood sugar 4 times a day.  vary the time of day when you check, between before the 3 meals, and at bedtime.  also check if you have symptoms of your blood sugar being too high or too low.  please keep a record of the readings and bring it to your next appointment here.  You can write it on any piece of paper.  please call us sooner if your blood sugar goes below 70, or if you have a lot of readings over 200.   Please come back for a follow-up appointment in 3 months.

## 2015-09-11 NOTE — Progress Notes (Signed)
Subjective:    Patient ID: Tyler [name], male    DOB: [birth year], 30 y.o.   MRN: [id number]  HPI Pt returns for f/u of diabetes mellitus:  DM type: 1 Dx'ed: 2549 Complications: nephropathy Therapy: insulin since soon after dx.  DKA: never Severe hypoglycemia: never.  Pancreatitis: never Other: he declines multiple daily injections; he works 3rd shift; he declines pump rx for now.   Interval history: no cbg record, but states cbg's are well-controlled.  This happens at work, despite taking less insulin those nights.  Past Medical History:  Diagnosis Date  . Anxiety   . Depression   . Diabetic peripheral neuropathy ([place])   . Hematemesis/vomiting blood admissions 09/2013, 01/02/2014  . Proteinuria   . PTSD (post-traumatic stress disorder)   . Stomach ulcer   . Type I diabetes mellitus ([place])     Past Surgical History:  Procedure Laterality Date  . APPENDECTOMY    . ESOPHAGOGASTRODUODENOSCOPY N/A 01/03/2014   Procedure: ESOPHAGOGASTRODUODENOSCOPY (EGD);  Surgeon: Missy Sabins, MD;  [facility] END[facility]COPY;  Service: Endoscopy;  Laterality: N/A;    Social History   Social History  . Marital status: Single    Spouse name: N/A  . Number of children: N/A  . Years of education: N/A   Occupational History  . Not on file.   Social History Main Topics  . Smoking status: Never Smoker  . Smokeless tobacco: Never Used  . Alcohol use No  . Drug use: No  . Sexual activity: Yes   Other Topics Concern  . Not on file   Social History Narrative  . No narrative on file    Current Outpatient Prescriptions on File Prior to Visit  Medication Sig Dispense Refill  . BD PEN NEEDLE NANO U/F 32G X 4 MM MISC USE AS DIRECTED WITH BASAGLAR  2  . blood glucose meter kit and supplies KIT Dispense per patient insurance preference. Use up to four times daily as directed 1 each 0  . gabapentin (NEURONTIN) 300 MG capsule Take 300 mg by mouth 3 (three) times daily.    Marland Kitchen  HYDROcodone-acetaminophen (NORCO/VICODIN) 5-325 MG tablet Take 1-2 tablets by mouth every 4 (four) hours as needed. 12 tablet 0  . hydrOXYzine (ATARAX/VISTARIL) 25 MG tablet Take 1 tablet (25 mg total) by mouth every 8 (eight) hours as needed for anxiety. 90 tablet 1  . insulin aspart (NOVOLOG FLEXPEN) 100 UNIT/ML FlexPen Inject 2 Units into the skin 3 (three) times daily with meals. 15 mL 2  . ipratropium (ATROVENT) 0.06 % nasal spray Place 2 sprays into the nose 3 (three) times daily. 15 mL 0  . pantoprazole (PROTONIX) 40 MG tablet Take 40 mg by mouth 2 (two) times daily.     . SUMAtriptan (IMITREX) 100 MG tablet Take 1 tablet (100 mg total) by mouth every 2 (two) hours as needed for migraine. May repeat in 2 hours if headache persists or recurs. 10 tablet 0   No current facility-administered medications on file prior to visit.     Allergies  Allergen Reactions  . Banana Anaphylaxis  . Food     Walnut-throat itchy/eyes swells  . Ibuprofen Other (See Comments)    Ulcers but pt does take gel caps and they do not bother his stomach    Family History  Problem Relation Age of Onset  . HIV/AIDS Father   . Drug abuse Father   . Drug abuse Sister   . Diabetes Cousin   . Suicidality Neg  Hx   . Bipolar disorder Neg Hx   . Depression Neg Hx   . Anxiety disorder Neg Hx     BP 104/70   Pulse 87   Ht 5' 6"  (1.676 m)   Wt 155 lb (70.3 kg)   SpO2 99%   BMI 25.02 kg/m   Review of Systems He denies hypoglycemia    Objective:   Physical Exam VITAL SIGNS:  See vs page.   GENERAL: no distress.   Pulses: dorsalis pedis intact bilat.   MSK: no deformity of the feet.   CV: no leg edema.   Skin:  no ulcer on the feet.  normal color and temp on the feet.   Neuro: sensation is intact to touch on the feet.      A1c=7.7%    Assessment & Plan:  type1 DM: this is the best control this pt should aim for, given this regimen, which does match insulin to his changing needs throughout the day.    Occupational state: according to cbg's, he needs to increase the spread of insulin dosage between work and non-work nights.

## 2015-09-23 ENCOUNTER — Ambulatory Visit: Payer: Self-pay | Admitting: Endocrinology

## 2015-10-20 ENCOUNTER — Emergency Department (HOSPITAL_COMMUNITY)
Admission: EM | Admit: 2015-10-20 | Discharge: 2015-10-20 | Disposition: A | Discharge status: Home / Self Care | Payer: BLUE CROSS/BLUE SHIELD | Attending: Emergency Medicine | Admitting: Emergency Medicine

## 2015-10-20 ENCOUNTER — Emergency Department (HOSPITAL_COMMUNITY): Payer: BLUE CROSS/BLUE SHIELD

## 2015-10-20 ENCOUNTER — Encounter (HOSPITAL_COMMUNITY): Payer: Self-pay | Admitting: Emergency Medicine

## 2015-10-20 DIAGNOSIS — Y929 Unspecified place or not applicable: Secondary | ICD-10-CM | POA: Insufficient documentation

## 2015-10-20 DIAGNOSIS — Y939 Activity, unspecified: Secondary | ICD-10-CM | POA: Insufficient documentation

## 2015-10-20 DIAGNOSIS — E109 Type 1 diabetes mellitus without complications: Secondary | ICD-10-CM | POA: Diagnosis not present

## 2015-10-20 DIAGNOSIS — W500XXA Accidental hit or strike by another person, initial encounter: Secondary | ICD-10-CM | POA: Insufficient documentation

## 2015-10-20 DIAGNOSIS — Y999 Unspecified external cause status: Secondary | ICD-10-CM | POA: Insufficient documentation

## 2015-10-20 DIAGNOSIS — S20212A Contusion of left front wall of thorax, initial encounter: Secondary | ICD-10-CM | POA: Diagnosis not present

## 2015-10-20 DIAGNOSIS — Z79899 Other long term (current) drug therapy: Secondary | ICD-10-CM | POA: Insufficient documentation

## 2015-10-20 DIAGNOSIS — S299XXA Unspecified injury of thorax, initial encounter: Secondary | ICD-10-CM | POA: Diagnosis present

## 2015-10-20 MED ORDER — MELOXICAM 15 MG PO TABS: 15.0000 mg | ORAL_TABLET | Freq: Every day | ORAL | 0 refills | Status: DC

## 2015-10-20 MED ORDER — LIDOCAINE 5 % EX PTCH: 1.0000 | MEDICATED_PATCH | CUTANEOUS | 0 refills | Status: DC

## 2015-10-20 NOTE — ED Notes (Signed)
Attempted to call PT name back for triage. PT currently not in waiting room

## 2015-10-20 NOTE — ED Triage Notes (Signed)
Patient here with complaints of left sided rib cage pain. Reports previous injury and fracture. Given muscle relaxants and hydrocodone, but states he did not take them. Pain 6/10.
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
## 2015-10-28 ENCOUNTER — Other Ambulatory Visit: Payer: Self-pay | Admitting: Endocrinology

## 2015-10-28 NOTE — Telephone Encounter (Signed)
Please refill x 1 Please ask a PCP for further refills.

## 2015-12-06 ENCOUNTER — Other Ambulatory Visit: Payer: Self-pay | Admitting: Endocrinology

## 2015-12-06 NOTE — Telephone Encounter (Signed)
Please advise pt these refills need to be done by a PCP

## 2015-12-11 ENCOUNTER — Ambulatory Visit (INDEPENDENT_AMBULATORY_CARE_PROVIDER_SITE_OTHER): Payer: BLUE CROSS/BLUE SHIELD | Admitting: Endocrinology

## 2015-12-11 ENCOUNTER — Encounter: Payer: Self-pay | Admitting: Endocrinology

## 2015-12-11 VITALS — BP 132/80 | HR 82 | Ht 67.0 in | Wt 161.0 lb

## 2015-12-11 DIAGNOSIS — Z23 Encounter for immunization: Secondary | ICD-10-CM

## 2015-12-11 DIAGNOSIS — E1021 Type 1 diabetes mellitus with diabetic nephropathy: Secondary | ICD-10-CM | POA: Diagnosis not present

## 2015-12-11 LAB — POCT GLYCOSYLATED HEMOGLOBIN (HGB A1C): HEMOGLOBIN A1C: 8.3

## 2015-12-11 MED ORDER — BASAGLAR KWIKPEN 100 UNIT/ML ~~LOC~~ SOPN: 23.0000 [IU] | PEN_INJECTOR | Freq: Every day | SUBCUTANEOUS | 11 refills | Status: DC

## 2015-12-11 NOTE — Progress Notes (Signed)
Subjective:    Patient ID: Tyler [name], male    DOB: [birth year], 30 y.o.   MRN: [id number]  HPI Pt returns for f/u of diabetes mellitus:  DM type: 1 Dx'ed: 9417 Complications: nephropathy.  Therapy: insulin since soon after dx.  DKA: never Severe hypoglycemia: never.  Pancreatitis: never.  Other: he declines multiple daily injections; he works 3rd shift, [employer]; he declines pump rx for now.   Interval history: pt states he feels well in general.  no cbg record, but states cbg's are well-controlled.  He says it is till lower on work nights than when he does not work.  He says he seldom misses the insulin.   Past Medical History:  Diagnosis Date  . Anxiety   . Depression   . Diabetic peripheral neuropathy ([place])   . Hematemesis/vomiting blood admissions 09/2013, 01/02/2014  . Proteinuria   . PTSD (post-traumatic stress disorder)   . Stomach ulcer   . Type I diabetes mellitus ([place])     Past Surgical History:  Procedure Laterality Date  . APPENDECTOMY    . ESOPHAGOGASTRODUODENOSCOPY N/A 01/03/2014   Procedure: ESOPHAGOGASTRODUODENOSCOPY (EGD);  Surgeon: Missy Sabins, MD;  Location: [facility] ENDOSCOPY;  Service: Endoscopy;  Laterality: N/A;    Social History   Social History  . Marital status: Single    Spouse name: N/A  . Number of children: N/A  . Years of education: N/A   Occupational History  . Not on file.   Social History Main Topics  . Smoking status: Never Smoker  . Smokeless tobacco: Never Used  . Alcohol use No  . Drug use: No  . Sexual activity: Yes   Other Topics Concern  . Not on file   Social History Narrative  . No narrative on file    Current Outpatient Prescriptions on File Prior to Visit  Medication Sig Dispense Refill  . BD PEN NEEDLE NANO U/F 32G X 4 MM MISC USE AS DIRECTED WITH BASAGLAR  2  . blood glucose meter kit and supplies KIT Dispense per patient insurance preference. Use up to four times daily as directed 1 each 0  . gabapentin  (NEURONTIN) 300 MG capsule TAKE ONE CAPSULE BY MOUTH 3 TIMES A DAY 90 capsule 0  . HYDROcodone-acetaminophen (NORCO/VICODIN) 5-325 MG tablet Take 1-2 tablets by mouth every 4 (four) hours as needed. 12 tablet 0  . hydrOXYzine (ATARAX/VISTARIL) 25 MG tablet Take 1 tablet (25 mg total) by mouth every 8 (eight) hours as needed for anxiety. 90 tablet 1  . insulin aspart (NOVOLOG FLEXPEN) 100 UNIT/ML FlexPen Inject 2 Units into the skin 3 (three) times daily with meals. 15 mL 2  . ipratropium (ATROVENT) 0.06 % nasal spray Place 2 sprays into the nose 3 (three) times daily. 15 mL 0  . lidocaine (LIDODERM) 5 % Place 1 patch onto the skin daily. Remove & Discard patch within 12 hours or as directed by MD 30 patch 0  . meloxicam (MOBIC) 15 MG tablet Take 1 tablet (15 mg total) by mouth daily. Take 1 daily with food. 10 tablet 0  . pantoprazole (PROTONIX) 40 MG tablet TAKE 1 TABLET BY MOUTH TWICE A DAY 60 tablet 0  . SUMAtriptan (IMITREX) 100 MG tablet Take 1 tablet (100 mg total) by mouth every 2 (two) hours as needed for migraine. May repeat in 2 hours if headache persists or recurs. 10 tablet 0   No current facility-administered medications on file prior to visit.  Allergies  Allergen Reactions  . Banana Anaphylaxis  . Food     Walnut-throat itchy/eyes swells  . Ibuprofen Other (See Comments)    Ulcers but pt does take gel caps and they do not bother his stomach    Family History  Problem Relation Age of Onset  . HIV/AIDS Father   . Drug abuse Father   . Drug abuse Sister   . Diabetes Cousin   . Suicidality Neg Hx   . Bipolar disorder Neg Hx   . Depression Neg Hx   . Anxiety disorder Neg Hx    BP 132/80   Pulse 82   Ht _0  (1.702 m)   Wt 161 lb (73 kg)   SpO2 95%   BMI 25.22 kg/m   Review of Systems He denies hypoglycemia.     Objective:   Physical Exam VITAL SIGNS:  See vs page GENERAL: no distress Pulses: dorsalis pedis intact bilat.   MSK: no deformity of the  feet CV: no leg edema Skin:  no ulcer on the feet.  normal color and temp on the feet. Neuro: sensation is intact to touch on the feet  Lab Results  Component Value Date   HGBA1C 8.3 12/11/2015      Assessment & Plan:  Type 1 DM, with nephropathy, worse Occupational status: he needs different dosing for work nights vs non-work nights. Patient is advised the following: Patient Instructions  Please increase the lantus to 25 units on nights when you don't work, and 12 units on nights when you work. Please also take 2 units of humalog if you eat a big meal.   On this type of insulin schedule, you should eat meals on a regular schedule.  If a meal is missed or significantly delayed, your blood sugar could go low.  check your blood sugar 4 times a day.  vary the time of day when you check, between before the 3 meals, and at bedtime.  also check if you have symptoms of your blood sugar being too high or too low.  please keep a record of the readings and bring it to your next appointment here.  You can write it on any piece of paper.  please call us sooner if your blood sugar goes below 70, or if you have a lot of readings over 200.   Please come back for a follow-up appointment in 3 months.

## 2015-12-11 NOTE — Patient Instructions (Addendum)
Please increase the lantus to 25 units on nights when you don't work, and 12 units on nights when you work. Please also take 2 units of humalog if you eat a big meal.   On this type of insulin schedule, you should eat meals on a regular schedule.  If a meal is missed or significantly delayed, your blood sugar could go low.  check your blood sugar 4 times a day.  vary the time of day when you check, between before the 3 meals, and at bedtime.  also check if you have symptoms of your blood sugar being too high or too low.  please keep a record of the readings and bring it to your next appointment here.  You can write it on any piece of paper.  please call us sooner if your blood sugar goes below 70, or if you have a lot of readings over 200.   Please come back for a follow-up appointment in 3 months.

## 2016-01-03 ENCOUNTER — Other Ambulatory Visit: Payer: Self-pay | Admitting: Endocrinology

## 2016-03-08 ENCOUNTER — Other Ambulatory Visit: Payer: Self-pay | Admitting: Endocrinology

## 2016-03-09 ENCOUNTER — Emergency Department (HOSPITAL_COMMUNITY)
Admission: EM | Admit: 2016-03-09 | Discharge: 2016-03-09 | Disposition: A | Discharge status: Home / Self Care | Payer: BLUE CROSS/BLUE SHIELD | Attending: Physician Assistant | Admitting: Physician Assistant

## 2016-03-09 ENCOUNTER — Emergency Department (HOSPITAL_COMMUNITY): Payer: BLUE CROSS/BLUE SHIELD

## 2016-03-09 ENCOUNTER — Encounter (HOSPITAL_COMMUNITY): Payer: Self-pay | Admitting: Emergency Medicine

## 2016-03-09 DIAGNOSIS — Y999 Unspecified external cause status: Secondary | ICD-10-CM | POA: Diagnosis not present

## 2016-03-09 DIAGNOSIS — E1040 Type 1 diabetes mellitus with diabetic neuropathy, unspecified: Secondary | ICD-10-CM | POA: Insufficient documentation

## 2016-03-09 DIAGNOSIS — M25562 Pain in left knee: Secondary | ICD-10-CM | POA: Insufficient documentation

## 2016-03-09 DIAGNOSIS — M25552 Pain in left hip: Secondary | ICD-10-CM

## 2016-03-09 DIAGNOSIS — Y92410 Unspecified street and highway as the place of occurrence of the external cause: Secondary | ICD-10-CM | POA: Insufficient documentation

## 2016-03-09 DIAGNOSIS — Y939 Activity, unspecified: Secondary | ICD-10-CM | POA: Diagnosis not present

## 2016-03-09 DIAGNOSIS — M79672 Pain in left foot: Secondary | ICD-10-CM

## 2016-03-09 MED ORDER — TRAMADOL HCL 50 MG PO TABS: 50.0000 mg | ORAL_TABLET | Freq: Four times a day (QID) | ORAL | 0 refills | Status: DC | PRN

## 2016-03-09 MED ORDER — HYDROCODONE-ACETAMINOPHEN 5-325 MG PO TABS
1.0000 | ORAL_TABLET | Freq: Once | ORAL | Status: AC
  Administered 2016-03-09: 1 via ORAL
  Filled 2016-03-09: qty 1
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
## 2016-03-09 NOTE — Discharge Instructions (Signed)
IMAGING was normal. This is likely musculoskeletal pain. Please rest, ice, elevate her left leg. Motrin and Tylenol for pain. Had giving her a short course of pain medicine. Wear the knee brace and postop shoe for comfort. Use the crutches for minimal weightbearing and to follow-up with orthopedist. Return to the ED if your symptoms worsen.
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
## 2016-03-09 NOTE — ED Notes (Signed)
Bed: WTR6 Expected date:  Expected time:  Means of arrival:  Comments: 

## 2016-03-11 ENCOUNTER — Ambulatory Visit: Payer: Self-pay | Admitting: Endocrinology

## 2016-03-30 ENCOUNTER — Encounter: Payer: Self-pay | Admitting: Endocrinology

## 2016-03-30 ENCOUNTER — Ambulatory Visit (INDEPENDENT_AMBULATORY_CARE_PROVIDER_SITE_OTHER): Payer: BLUE CROSS/BLUE SHIELD | Admitting: Endocrinology

## 2016-03-30 DIAGNOSIS — E1021 Type 1 diabetes mellitus with diabetic nephropathy: Secondary | ICD-10-CM | POA: Diagnosis not present

## 2016-03-30 LAB — POCT GLYCOSYLATED HEMOGLOBIN (HGB A1C): Hemoglobin A1C: 8

## 2016-03-30 MED ORDER — BASAGLAR KWIKPEN 100 UNIT/ML ~~LOC~~ SOPN: 25.0000 [IU] | PEN_INJECTOR | Freq: Every day | SUBCUTANEOUS | 2 refills | Status: DC

## 2016-03-30 NOTE — Progress Notes (Signed)
Subjective:    Patient ID: Tyler [name], male    DOB: 25-Dec-1985, 31 y.o.   MRN: [id number]  HPI Pt returns for f/u of diabetes mellitus:  DM type: 1 Dx'ed: 8891 Complications: nephropathy.  Therapy: insulin since soon after dx.  DKA: never Severe hypoglycemia: never.  Pancreatitis: never.  Other: he declines multiple daily injections; he works 3rd shift, [employer]; he declines pump rx for now.   Interval history: pt states he feels well in general.  He has frequent mild hypoglycemia.  This happens when he gets home from work.  However, he says cbg's on work days are similar to non-work days.  no cbg record, but states hypoglycemia happens after he has not eaten for some hours.  He seldom takes the humalog.   Past Medical History:  Diagnosis Date  . Anxiety   . Depression   . Diabetic peripheral neuropathy ([place])   . Hematemesis/vomiting blood admissions 09/2013, 01/02/2014  . Proteinuria   . PTSD (post-traumatic stress disorder)   . Stomach ulcer   . Type I diabetes mellitus ([place])     Past Surgical History:  Procedure Laterality Date  . APPENDECTOMY    . ESOPHAGOGASTRODUODENOSCOPY N/A 01/03/2014   Procedure: ESOPHAGOGASTRODUODENOSCOPY (EGD);  Surgeon: Missy Sabins, MD;  Location[facility] ENDOSCOPY;  Service: Endoscopy;  Laterality: N/A;    Social History   Social History  . Marital status: Single    Spouse name: N/A  . Number of children: N/A  . Years of education: N/A   Occupational History  . Not on file.   Social History Main Topics  . Smoking status: Never Smoker  . Smokeless tobacco: Never Used  . Alcohol use No  . Drug use: No  . Sexual activity: Yes   Other Topics Concern  . Not on file   Social History Narrative  . No narrative on file    Current Outpatient Prescriptions on File Prior to Visit  Medication Sig Dispense Refill  . BD PEN NEEDLE NANO U/F 32G X 4 MM MISC USE AS DIRECTED WITH BASAGLAR  2  . blood glucose meter kit and supplies KIT Dispense  per patient insurance preference. Use up to four times daily as directed 1 each 0  . gabapentin (NEURONTIN) 300 MG capsule TAKE ONE CAPSULE BY MOUTH 3 TIMES A DAY 90 capsule 0  . hydrOXYzine (ATARAX/VISTARIL) 25 MG tablet Take 1 tablet (25 mg total) by mouth every 8 (eight) hours as needed for anxiety. 90 tablet 1  . insulin aspart (NOVOLOG FLEXPEN) 100 UNIT/ML FlexPen Inject 2 Units into the skin 3 (three) times daily with meals. 15 mL 2  . ipratropium (ATROVENT) 0.06 % nasal spray Place 2 sprays into the nose 3 (three) times daily. 15 mL 0  . lidocaine (LIDODERM) 5 % Place 1 patch onto the skin daily. Remove & Discard patch within 12 hours or as directed by MD 30 patch 0  . meloxicam (MOBIC) 15 MG tablet Take 1 tablet (15 mg total) by mouth daily. Take 1 daily with food. 10 tablet 0  . pantoprazole (PROTONIX) 40 MG tablet TAKE 1 TABLET BY MOUTH TWICE A DAY 60 tablet 0  . SUMAtriptan (IMITREX) 100 MG tablet Take 1 tablet (100 mg total) by mouth every 2 (two) hours as needed for migraine. May repeat in 2 hours if headache persists or recurs. 10 tablet 0  . traMADol (ULTRAM) 50 MG tablet Take 1 tablet (50 mg total) by mouth every 6 (six) hours  as needed. 5 tablet 0   No current facility-administered medications on file prior to visit.     Allergies  Allergen Reactions  . Banana Anaphylaxis  . Food     Walnut-throat itchy/eyes swells  . Ibuprofen Other (See Comments)    Ulcers but pt does take gel caps and they do not bother his stomach    Family History  Problem Relation Age of Onset  . HIV/AIDS Father   . Drug abuse Father   . Drug abuse Sister   . Diabetes Cousin   . Suicidality Neg Hx   . Bipolar disorder Neg Hx   . Depression Neg Hx   . Anxiety disorder Neg Hx     BP 122/80   Pulse 84   Ht 5' 7"  (1.702 m)   Wt 156 lb (70.8 kg)   SpO2 93%   BMI 24.43 kg/m    Review of Systems Denies LOC.     Objective:   Physical Exam VITAL SIGNS:  See vs page GENERAL: no  distress Pulses: dorsalis pedis intact bilat.   MSK: no deformity of the feet CV: no leg edema Skin:  no ulcer on the feet.  normal color and temp on the feet.  Neuro: sensation is intact to touch on the feet.   A1c=8.0%     Assessment & Plan:  Insulin-requiring type 2 DM: due to his request for QD insulin, he needs frequent small meals.   Occupational state: he needs to vary insulin according to work schedule.    Patient is advised the following: Patient Instructions  Please continue the lantus: 25 units on nights when you don't work, and 12 units on nights when you work. Please also take 2 units of humalog if you eat a big meal.   On this type of insulin schedule, you should eat meals on a regular schedule.  If a meal is missed or significantly delayed, your blood sugar could go low. Eating on a regular schedule is the next step in getting your blood sugar better.  check your blood sugar 4 times a day.  vary the time of day when you check, between before the 3 meals, and at bedtime.  also check if you have symptoms of your blood sugar being too high or too low.  please keep a record of the readings and bring it to your next appointment here.  You can write it on any piece of paper.  please call us sooner if your blood sugar goes below 70, or if you have a lot of readings over 200.   Please come back for a follow-up appointment in 2-3 months.

## 2016-03-30 NOTE — Patient Instructions (Addendum)
Please continue the lantus: 25 units on nights when you don't work, and 12 units on nights when you work. Please also take 2 units of humalog if you eat a big meal.   On this type of insulin schedule, you should eat meals on a regular schedule.  If a meal is missed or significantly delayed, your blood sugar could go low. Eating on a regular schedule is the next step in getting your blood sugar better.  check your blood sugar 4 times a day.  vary the time of day when you check, between before the 3 meals, and at bedtime.  also check if you have symptoms of your blood sugar being too high or too low.  please keep a record of the readings and bring it to your next appointment here.  You can write it on any piece of paper.  please call us sooner if your blood sugar goes below 70, or if you have a lot of readings over 200.   Please come back for a follow-up appointment in 2-3 months.

## 2016-04-15 ENCOUNTER — Telehealth: Payer: Self-pay | Admitting: Endocrinology

## 2016-04-15 ENCOUNTER — Encounter: Payer: Self-pay | Admitting: Dietician

## 2016-04-15 DIAGNOSIS — E1021 Type 1 diabetes mellitus with diabetic nephropathy: Secondary | ICD-10-CM

## 2016-04-15 MED ORDER — BASAGLAR KWIKPEN 100 UNIT/ML ~~LOC~~ SOPN: 25.0000 [IU] | PEN_INJECTOR | Freq: Every day | SUBCUTANEOUS | 2 refills | Status: DC

## 2016-04-15 NOTE — Telephone Encounter (Signed)
I contacted the patient and advisd via voicemail we have submitted the refill for the basaglar back to the [facility] pharmacy.

## 2016-04-15 NOTE — Telephone Encounter (Signed)
Pt states that he did not have the money to pick up his RX after his last visit, [place] and the pharmacy [place] it back. Pt is wondering if we can send it back to the pharmacy, please call pt when possible

## 2016-05-30 ENCOUNTER — Ambulatory Visit: Payer: Self-pay | Admitting: Endocrinology

## 2016-07-09 ENCOUNTER — Encounter (HOSPITAL_COMMUNITY): Payer: Self-pay | Admitting: Emergency Medicine

## 2016-07-09 ENCOUNTER — Emergency Department (HOSPITAL_COMMUNITY): Payer: BLUE CROSS/BLUE SHIELD

## 2016-07-09 ENCOUNTER — Emergency Department (HOSPITAL_COMMUNITY)
Admission: EM | Admit: 2016-07-09 | Discharge: 2016-07-09 | Disposition: A | Discharge status: Home / Self Care | Payer: BLUE CROSS/BLUE SHIELD | Attending: Emergency Medicine | Admitting: Emergency Medicine

## 2016-07-09 DIAGNOSIS — Y999 Unspecified external cause status: Secondary | ICD-10-CM | POA: Insufficient documentation

## 2016-07-09 DIAGNOSIS — Y9359 Activity, other involving other sports and athletics played individually: Secondary | ICD-10-CM | POA: Diagnosis not present

## 2016-07-09 DIAGNOSIS — Z794 Long term (current) use of insulin: Secondary | ICD-10-CM | POA: Insufficient documentation

## 2016-07-09 DIAGNOSIS — W228XXA Striking against or struck by other objects, initial encounter: Secondary | ICD-10-CM | POA: Insufficient documentation

## 2016-07-09 DIAGNOSIS — Z791 Long term (current) use of non-steroidal anti-inflammatories (NSAID): Secondary | ICD-10-CM | POA: Insufficient documentation

## 2016-07-09 DIAGNOSIS — E1040 Type 1 diabetes mellitus with diabetic neuropathy, unspecified: Secondary | ICD-10-CM | POA: Insufficient documentation

## 2016-07-09 DIAGNOSIS — S60221A Contusion of right hand, initial encounter: Secondary | ICD-10-CM | POA: Diagnosis not present

## 2016-07-09 DIAGNOSIS — Y929 Unspecified place or not applicable: Secondary | ICD-10-CM | POA: Insufficient documentation

## 2016-07-09 DIAGNOSIS — S6991XA Unspecified injury of right wrist, hand and finger(s), initial encounter: Secondary | ICD-10-CM | POA: Diagnosis present

## 2016-07-09 DIAGNOSIS — Z79899 Other long term (current) drug therapy: Secondary | ICD-10-CM | POA: Insufficient documentation

## 2016-07-09 DIAGNOSIS — M79641 Pain in right hand: Secondary | ICD-10-CM

## 2016-07-09 MED ORDER — ACETAMINOPHEN 500 MG PO TABS
1000.0000 mg | ORAL_TABLET | Freq: Once | ORAL | Status: AC
  Administered 2016-07-09: 1000 mg via ORAL
  Filled 2016-07-09: qty 2

## 2016-07-09 NOTE — ED Provider Notes (Signed)
[place] DEPT Provider Note   CSN: [id number] Arrival date & time: 07/09/16  1129  By signing my name below, I, Tyler [name], attest that this documentation has been prepared under the direction and in the presence of Providence Lanius, PA-C. Electronically Signed: [name], ED Scribe. 07/09/16. 1:30 PM.  History   Chief Complaint Chief Complaint  Patient presents with  . Hand Pain   The history is provided by the patient.   HPI Comments: Tyler [name] is a 31 y.o. male with a history of DM I, anxiety, and depression, who presents to the Emergency Department complaining of sudden onset, right hand pain that occurred at 10:30 this morning after punching a punching bag at the gym. Patient reports that he had a boxing glove on his hand while he was punching the punching bag. He reports that the punching bag got caught up against the wall when he punched it. He did not punch the wall. Pt notes associated pain that radiates from 4th and 5th digit of his right hand into his forearm. Pain is worsened with movement of his hand. No other alleviating or aggravating factors. He did not try any medications prior to arrival. He denies any numbness/weakness. No other injury.  Past Medical History:  Diagnosis Date  . Anxiety   . Depression   . Diabetic peripheral neuropathy ([place])   . Hematemesis/vomiting blood admissions 09/2013, 01/02/2014  . Proteinuria   . PTSD (post-traumatic stress disorder)   . Stomach ulcer   . Type I diabetes mellitus [facility])    Patient Active Problem List   Diagnosis Date Noted  . Diabetes ([place]) 03/31/2015  . Generalized abdominal pain   . Upper GI bleed   . Intermittent explosive disorder 11/21/2013  . GAD (generalized anxiety disorder) 11/21/2013  . Major depressive disorder, recurrent episode, moderate ([place]) 11/21/2013  . PTSD (post-traumatic stress disorder) 11/21/2013   Past Surgical History:  Procedure Laterality Date  . APPENDECTOMY    .  ESOPHAGOGASTRODUODENOSCOPY N/A 01/03/2014   Procedure: ESOPHAGOGASTRODUODENOSCOPY (EGD);  Surgeon: Missy Sabins, MD;  Location: [facility] ENDOSCOPY;  Service: Endoscopy;  Laterality: N/A;    Home Medications    Prior to Admission medications   Medication Sig Start Date End Date Taking? Authorizing Provider  BD PEN NEEDLE NANO U/F 32G X 4 MM MISC USE AS DIRECTED WITH BASAGLAR 06/12/15   [provider]  blood glucose meter kit and supplies KIT Dispense per patient insurance preference. Use up to four times daily as directed 02/15/15   Weber, Sarah L, PA-C  gabapentin (NEURONTIN) 300 MG capsule TAKE ONE CAPSULE BY MOUTH 3 TIMES A DAY 01/03/16   Renato Shin, MD  hydrOXYzine (ATARAX/VISTARIL) 25 MG tablet Take 1 tablet (25 mg total) by mouth every 8 (eight) hours as needed for anxiety. 11/21/13   Charlcie Cradle, MD  insulin aspart (NOVOLOG FLEXPEN) 100 UNIT/ML FlexPen Inject 2 Units into the skin 3 (three) times daily with meals. 04/01/15   Renato Shin, MD  Insulin Glargine (BASAGLAR KWIKPEN) 100 UNIT/ML SOPN Inject 0.25 mLs (25 Units total) into the skin at bedtime. 04/15/16   Renato Shin, MD  ipratropium (ATROVENT) 0.06 % nasal spray Place 2 sprays into the nose 3 (three) times daily. 02/15/15   Weber, Damaris Hippo, PA-C  lidocaine (LIDODERM) 5 % Place 1 patch onto the skin daily. Remove & Discard patch within 12 hours or as directed by MD 10/20/15   Margarita Mail, PA-C  meloxicam (MOBIC) 15 MG tablet Take 1 tablet (15  mg total) by mouth daily. Take 1 daily with food. 10/20/15   Harris, Vernie Shanks, PA-C  pantoprazole (PROTONIX) 40 MG tablet TAKE 1 TABLET BY MOUTH TWICE A DAY 01/03/16   Renato Shin, MD  SUMAtriptan (IMITREX) 100 MG tablet Take 1 tablet (100 mg total) by mouth every 2 (two) hours as needed for migraine. May repeat in 2 hours if headache persists or recurs. 02/15/15   Weber, Damaris Hippo, PA-C  traMADol (ULTRAM) 50 MG tablet Take 1 tablet (50 mg total) by mouth every 6 (six) hours as needed.  03/09/16   Doristine Devoid, PA-C   Family History Family History  Problem Relation Age of Onset  . HIV/AIDS Father   . Drug abuse Father   . Drug abuse Sister   . Diabetes Cousin   . Suicidality Neg Hx   . Bipolar disorder Neg Hx   . Depression Neg Hx   . Anxiety disorder Neg Hx    Social History Social History  Substance Use Topics  . Smoking status: Never Smoker  . Smokeless tobacco: Never Used  . Alcohol use No   Allergies   Banana; Food; and Ibuprofen  Review of Systems Review of Systems  Musculoskeletal:       Right hand pain  Neurological: Negative for weakness and numbness.  All other systems reviewed and are negative.  Physical Exam Updated Vital Signs BP 130/79 (BP Location: Left Arm)   Pulse 81   Temp 98.1 F (36.7 C) (Oral)   Resp 16   Ht 5' 7"  (1.702 m)   Wt 70.8 kg (156 lb)   SpO2 100%   BMI 24.43 kg/m   Physical Exam  Constitutional: He appears well-developed and well-nourished.  Sitting comfortably on examination table  HENT:  Head: Normocephalic and atraumatic.  Eyes: Conjunctivae and EOM are normal. Right eye exhibits no discharge. Left eye exhibits no discharge. No scleral icterus.  Cardiovascular:  Pulses:      Radial pulses are 2+ on the right side, and 2+ on the left side.  Pulmonary/Chest: Effort normal.  Musculoskeletal: He exhibits tenderness.       Left wrist: Normal.  Tenderness palpation to the fourth and fifth digits of the right hand. No focal tenderness. No deformity or crepitus noted. No overlying ecchymosis, edema. Tenderness to palpation to the fourth and fifth metacarpal bones. No deformity or crepitus. No tenderness palpation to the ulnar aspect of the wrist. No tenderness to palpation to the radial aspect of the wrist. No snuffbox tenderness. Full flexion/extension of digits 1 through 5 intact but with subjective reports of pain with the fourth and fifth digits. He is able to make a fist but reports subjective  pain.opposition of right thumb intact.  Flexion and extension of wrist intact but with subjective reports of pain. No focal tenderness to the right forearm. No deformity or crepitus area no overlying ecchymosis or edema.  Neurological: He is alert.  Skin: Skin is warm and dry.  Psychiatric: He has a normal mood and affect. His speech is normal and behavior is normal.  Nursing note and vitals reviewed.  2+ radial pulses bilaterally. TTP to the 4th/5th digits and the ulnar aspect of his right hand.  ED Treatments / Results  DIAGNOSTIC STUDIES: Oxygen Saturation is 100% on RA, normal by my interpretation.   COORDINATION OF CARE: 12:59 PM-Discussed next steps with pt. Pt verbalized understanding and is agreeable with the plan.   Labs (all labs ordered are listed, but only abnormal results  are displayed) Labs Reviewed - No data to display  EKG  EKG Interpretation None       Radiology Dg Hand Complete Right  Result Date: 07/09/2016 CLINICAL DATA:  Right hand injury. EXAM: RIGHT HAND - COMPLETE 3+ VIEW COMPARISON:  None. FINDINGS: There is no evidence of fracture or dislocation. There is no evidence of arthropathy or other focal bone abnormality. Soft tissues are unremarkable. IMPRESSION: Negative. Electronically Signed   By: Kerby Moors M.D.   On: 07/09/2016 12:54    Procedures Procedures (including critical care time)  Medications Ordered in ED Medications  acetaminophen (TYLENOL) tablet 1,000 mg (1,000 mg Oral Given 07/09/16 1321)     Initial Impression / Assessment and Plan / ED Course  I have reviewed the triage vital signs and the nursing notes.  Pertinent labs & imaging results that were available during my care of the patient were reviewed by me and considered in my medical decision making (see chart for details).     31 year old male who presents with right hand pain that began this morning after punching a punching bag. Patient is neurovascularly intact.Consider  fracture versus dislocation versus sprain versus contusion. X-rays ordered at triage. Analgesics given in the department.  X-rays reviewed. Negative for any acute fracture or dislocation. Symptoms likely result of contusion/sprain. Discussed results with patient. Will plan to provide a splint for support and stabilization. Conservative therapy discussed. Provided patient with a list of clinic resources to use if he does not have a PCP. Instructed to call them today to arrange follow-up in the next 24-48 hours.  Strict return precautions discussed. Patient expresses understanding and agreement to plan  Final Clinical Impressions(s) / ED Diagnoses   Final diagnoses:  Contusion of right hand, initial encounter  Pain of right hand    New Prescriptions New Prescriptions   No medications on file  I personally performed the services described in this documentation, which was scribed in my presence. The recorded information has been reviewed and is accurate.      Volanda Napoleon, PA-C 07/09/16 1343    Isla Pence, MD 07/09/16 1524

## 2016-07-09 NOTE — Discharge Instructions (Signed)
Follow-up with her primary care doctor in the next 24-48 hours for further evaluation.  Wear the splint for support and comfort for the next several days. Make sure you're applying ice to the affected area to help with the pain.  Return the emergency Department for any worsening pain, redness/swelling of the arm that spread, discoloration of the fingers, numbness or weakness or any other worsening or concerning symptoms.  If you do not have a primary care doctor you see regularly, please you the list below. Please call them to arrange for follow-up.   No Primary Care Doctor Call [facility]  [phone] Other agencies that provide inexpensive medical care    [facility]  [phone]    [facility]  [phone]    [facility]  [phone]    [facility]  [phone]    [facility]  [phone]    [facility]  [phone]

## 2016-07-09 NOTE — ED Triage Notes (Signed)
Pt complaint of right hand pain post punching punching bag a gym; event 1030 today; denies wrist pain.

## 2016-07-18 ENCOUNTER — Telehealth: Payer: Self-pay | Admitting: Endocrinology

## 2016-07-18 DIAGNOSIS — E1021 Type 1 diabetes mellitus with diabetic nephropathy: Secondary | ICD-10-CM

## 2016-07-18 MED ORDER — BASAGLAR KWIKPEN 100 UNIT/ML ~~LOC~~ SOPN: 25.0000 [IU] | PEN_INJECTOR | Freq: Every day | SUBCUTANEOUS | 2 refills | Status: DC

## 2016-07-18 NOTE — Telephone Encounter (Signed)
Refill submitted. 

## 2016-07-18 NOTE — Telephone Encounter (Signed)
**  Remind patient they can make refill requests via MyChart**  Medication refill request (Name & Dosage): Insulin Glargine (BASAGLAR KWIKPEN) 100 UNIT/ML SOPN [[id number]][[id number]]     Preferred pharmacy (Name & Address):   [id number] - [place], [place] - [address] AT [facility] [address] [phone] (Phone) [phone] (Fax)       Other comments (if applicable):

## 2016-08-26 NOTE — Plan of Care (Signed)
Transfer [facility]. Mr. Tyler [name] is a 31 year old male with PMH significant for DM type 1, PUD, hematemesis/GI bleed, and PTSD; who presented to the facility for hematemesis. Patient reportedly off Protonix 1 month. Reported to have 1 episode of hematemesis while in their emergency department. Hemoglobin 14.3, and all vital signs otherwise stable. Requested transfer for need of GI consultative services. Requested transferring facility to consult GI prior to arrival. Accepted to a MedSurg bed.

## 2016-08-27 ENCOUNTER — Encounter (HOSPITAL_COMMUNITY): Payer: Self-pay

## 2016-08-27 ENCOUNTER — Observation Stay (HOSPITAL_COMMUNITY): Payer: BLUE CROSS/BLUE SHIELD | Admitting: Anesthesiology

## 2016-08-27 ENCOUNTER — Observation Stay (HOSPITAL_COMMUNITY)
Admission: EM | Admit: 2016-08-27 | Discharge: 2016-08-29 | Disposition: A | Discharge status: Home / Self Care | Payer: BLUE CROSS/BLUE SHIELD | Source: Other Acute Inpatient Hospital | Attending: Internal Medicine | Admitting: Internal Medicine

## 2016-08-27 ENCOUNTER — Encounter (HOSPITAL_COMMUNITY)
Admission: EM | Disposition: A | Discharge status: Home / Self Care | Payer: Self-pay | Source: Other Acute Inpatient Hospital | Attending: Internal Medicine

## 2016-08-27 DIAGNOSIS — N179 Acute kidney failure, unspecified: Secondary | ICD-10-CM | POA: Insufficient documentation

## 2016-08-27 DIAGNOSIS — F4310 Post-traumatic stress disorder, unspecified: Secondary | ICD-10-CM | POA: Diagnosis present

## 2016-08-27 DIAGNOSIS — Z8719 Personal history of other diseases of the digestive system: Secondary | ICD-10-CM | POA: Insufficient documentation

## 2016-08-27 DIAGNOSIS — F411 Generalized anxiety disorder: Secondary | ICD-10-CM | POA: Diagnosis not present

## 2016-08-27 DIAGNOSIS — D62 Acute posthemorrhagic anemia: Secondary | ICD-10-CM | POA: Diagnosis not present

## 2016-08-27 DIAGNOSIS — E1021 Type 1 diabetes mellitus with diabetic nephropathy: Secondary | ICD-10-CM

## 2016-08-27 DIAGNOSIS — E109 Type 1 diabetes mellitus without complications: Secondary | ICD-10-CM

## 2016-08-27 DIAGNOSIS — Z8711 Personal history of peptic ulcer disease: Secondary | ICD-10-CM | POA: Insufficient documentation

## 2016-08-27 DIAGNOSIS — E119 Type 2 diabetes mellitus without complications: Secondary | ICD-10-CM

## 2016-08-27 DIAGNOSIS — N289 Disorder of kidney and ureter, unspecified: Secondary | ICD-10-CM

## 2016-08-27 DIAGNOSIS — Z794 Long term (current) use of insulin: Secondary | ICD-10-CM | POA: Diagnosis not present

## 2016-08-27 DIAGNOSIS — F418 Other specified anxiety disorders: Secondary | ICD-10-CM | POA: Diagnosis not present

## 2016-08-27 DIAGNOSIS — E1042 Type 1 diabetes mellitus with diabetic polyneuropathy: Secondary | ICD-10-CM | POA: Diagnosis not present

## 2016-08-27 DIAGNOSIS — K922 Gastrointestinal hemorrhage, unspecified: Secondary | ICD-10-CM | POA: Diagnosis present

## 2016-08-27 DIAGNOSIS — Z7982 Long term (current) use of aspirin: Secondary | ICD-10-CM | POA: Diagnosis not present

## 2016-08-27 DIAGNOSIS — E1065 Type 1 diabetes mellitus with hyperglycemia: Secondary | ICD-10-CM | POA: Diagnosis not present

## 2016-08-27 DIAGNOSIS — E1022 Type 1 diabetes mellitus with diabetic chronic kidney disease: Secondary | ICD-10-CM | POA: Diagnosis not present

## 2016-08-27 DIAGNOSIS — K920 Hematemesis: Secondary | ICD-10-CM | POA: Diagnosis present

## 2016-08-27 DIAGNOSIS — F331 Major depressive disorder, recurrent, moderate: Secondary | ICD-10-CM | POA: Diagnosis not present

## 2016-08-27 HISTORY — PX: ESOPHAGOGASTRODUODENOSCOPY: SHX5428

## 2016-08-27 LAB — GLUCOSE, CAPILLARY
GLUCOSE-CAPILLARY: 66 mg/dL (ref 65–99)
GLUCOSE-CAPILLARY: 128 mg/dL — AB (ref 65–99)
GLUCOSE-CAPILLARY: 63 mg/dL — AB (ref 65–99)
Glucose-Capillary: 124 mg/dL — ABNORMAL HIGH (ref 65–99)
Glucose-Capillary: 129 mg/dL — ABNORMAL HIGH (ref 65–99)
Glucose-Capillary: 150 mg/dL — ABNORMAL HIGH (ref 65–99)
Glucose-Capillary: 176 mg/dL — ABNORMAL HIGH (ref 65–99)
Glucose-Capillary: 61 mg/dL — ABNORMAL LOW (ref 65–99)
Glucose-Capillary: 71 mg/dL (ref 65–99)
Glucose-Capillary: 72 mg/dL (ref 65–99)
Glucose-Capillary: 92 mg/dL (ref 65–99)
Glucose-Capillary: 97 mg/dL (ref 65–99)

## 2016-08-27 LAB — HIV ANTIBODY (ROUTINE TESTING W REFLEX): HIV Screen 4th Generation wRfx: NONREACTIVE

## 2016-08-27 LAB — COMPREHENSIVE METABOLIC PANEL
ALK PHOS: 65 U/L (ref 38–126)
ALT: 16 U/L — AB (ref 17–63)
AST: 23 U/L (ref 15–41)
Albumin: 4 g/dL (ref 3.5–5.0)
Anion gap: 6 (ref 5–15)
BUN: 12 mg/dL (ref 6–20)
CALCIUM: 9.2 mg/dL (ref 8.9–10.3)
CHLORIDE: 105 mmol/L (ref 101–111)
CO2: 29 mmol/L (ref 22–32)
CREATININE: 1.18 mg/dL (ref 0.61–1.24)
GFR calc non Af Amer: >60 mL/min (ref >60)
GLUCOSE: 77 mg/dL (ref 65–99)
Potassium: 3.9 mmol/L (ref 3.5–5.1)
SODIUM: 140 mmol/L (ref 135–145)
Total Bilirubin: 1.2 mg/dL (ref 0.3–1.2)
Total Protein: 7.7 g/dL (ref 6.5–8.1)

## 2016-08-27 LAB — CBC
HCT: 41.4 % (ref 39.0–52.0)
Hemoglobin: 13.7 g/dL (ref 13.0–17.0)
MCH: 27 pg (ref 26.0–34.0)
MCHC: 33.1 g/dL (ref 30.0–36.0)
MCV: 81.5 fL (ref 78.0–100.0)
PLATELETS: 146 10*3/uL — AB (ref 150–400)
RBC: 5.08 MIL/uL (ref 4.22–5.81)
RDW: 12.8 % (ref 11.5–15.5)
WBC: 5.2 10*3/uL (ref 4.0–10.5)

## 2016-08-27 LAB — PROTIME-INR
INR: 1.1
Prothrombin Time: 14.3 seconds (ref 11.4–15.2)

## 2016-08-27 LAB — HEMOGLOBIN AND HEMATOCRIT, BLOOD
HCT: 39.5 % (ref 39.0–52.0)
HEMOGLOBIN: 12.8 g/dL — AB (ref 13.0–17.0)

## 2016-08-27 LAB — TYPE AND SCREEN
ABO/RH(D): O POS
ANTIBODY SCREEN: NEGATIVE

## 2016-08-27 SURGERY — EGD (ESOPHAGOGASTRODUODENOSCOPY)
Anesthesia: Monitor Anesthesia Care

## 2016-08-27 MED ORDER — FENTANYL CITRATE (PF) 100 MCG/2ML IJ SOLN
INTRAMUSCULAR | Status: AC
  Filled 2016-08-27: qty 2

## 2016-08-27 MED ORDER — ONDANSETRON HCL 4 MG/2ML IJ SOLN: 4.0000 mg | Freq: Four times a day (QID) | INTRAMUSCULAR | Status: DC | PRN

## 2016-08-27 MED ORDER — DEXTROSE 50 % IV SOLN
INTRAVENOUS | Status: AC
  Filled 2016-08-27: qty 50

## 2016-08-27 MED ORDER — GABAPENTIN 300 MG PO CAPS
300.0000 mg | ORAL_CAPSULE | Freq: Three times a day (TID) | ORAL | Status: DC
  Filled 2016-08-27: qty 1

## 2016-08-27 MED ORDER — DEXTROSE 50 % IV SOLN: 25.0000 mL | Freq: Once | INTRAVENOUS | Status: AC

## 2016-08-27 MED ORDER — SODIUM CHLORIDE 0.9 % IV SOLN
INTRAVENOUS | Status: AC
  Administered 2016-08-27: 03:00:00 via INTRAVENOUS

## 2016-08-27 MED ORDER — INSULIN GLARGINE 100 UNIT/ML ~~LOC~~ SOLN
15.0000 [IU] | Freq: Every day | SUBCUTANEOUS | Status: DC
  Administered 2016-08-27 – 2016-08-28 (×2): 15 [IU] via SUBCUTANEOUS
  Filled 2016-08-27 (×3): qty 0.15

## 2016-08-27 MED ORDER — MIDAZOLAM HCL 5 MG/5ML IJ SOLN
INTRAMUSCULAR | Status: DC | PRN
  Administered 2016-08-27: 2 mg via INTRAVENOUS
  Administered 2016-08-27: 1 mg via INTRAVENOUS

## 2016-08-27 MED ORDER — MORPHINE SULFATE (PF) 2 MG/ML IV SOLN: 1.0000 mg | INTRAVENOUS | Status: DC | PRN

## 2016-08-27 MED ORDER — FENTANYL CITRATE (PF) 100 MCG/2ML IJ SOLN
INTRAMUSCULAR | Status: DC | PRN
  Administered 2016-08-27 (×3): 25 ug via INTRAVENOUS

## 2016-08-27 MED ORDER — HYDROXYZINE HCL 25 MG PO TABS: 25.0000 mg | ORAL_TABLET | Freq: Three times a day (TID) | ORAL | Status: DC | PRN

## 2016-08-27 MED ORDER — DEXTROSE 50 % IV SOLN
25.0000 mL | Freq: Once | INTRAVENOUS | Status: AC
  Administered 2016-08-27: 25 mL via INTRAVENOUS

## 2016-08-27 MED ORDER — MIDAZOLAM HCL 5 MG/ML IJ SOLN
INTRAMUSCULAR | Status: AC
  Filled 2016-08-27: qty 2

## 2016-08-27 MED ORDER — SERTRALINE HCL 25 MG PO TABS
25.0000 mg | ORAL_TABLET | Freq: Every day | ORAL | Status: DC
  Filled 2016-08-27: qty 1

## 2016-08-27 MED ORDER — DIPHENHYDRAMINE HCL 50 MG/ML IJ SOLN
INTRAMUSCULAR | Status: AC
  Filled 2016-08-27: qty 1

## 2016-08-27 MED ORDER — ACETAMINOPHEN 325 MG PO TABS
650.0000 mg | ORAL_TABLET | Freq: Four times a day (QID) | ORAL | Status: DC | PRN
  Administered 2016-08-28: 650 mg via ORAL
  Filled 2016-08-27: qty 2

## 2016-08-27 MED ORDER — DEXTROSE 50 % IV SOLN
INTRAVENOUS | Status: DC | PRN
  Administered 2016-08-27: 25 mL via INTRAVENOUS

## 2016-08-27 MED ORDER — ONDANSETRON HCL 4 MG PO TABS: 4.0000 mg | ORAL_TABLET | Freq: Four times a day (QID) | ORAL | Status: DC | PRN

## 2016-08-27 MED ORDER — DIPHENHYDRAMINE HCL 50 MG/ML IJ SOLN
INTRAMUSCULAR | Status: DC | PRN
  Administered 2016-08-27: 25 mg via INTRAVENOUS
  Administered 2016-08-27: 15 mg via INTRAVENOUS

## 2016-08-27 MED ORDER — ACETAMINOPHEN 650 MG RE SUPP: 650.0000 mg | Freq: Four times a day (QID) | RECTAL | Status: DC | PRN

## 2016-08-27 MED ORDER — IPRATROPIUM BROMIDE 0.06 % NA SOLN
2.0000 | Freq: Three times a day (TID) | NASAL | Status: DC
  Filled 2016-08-27: qty 15

## 2016-08-27 MED ORDER — SODIUM CHLORIDE 0.9% FLUSH
3.0000 mL | Freq: Two times a day (BID) | INTRAVENOUS | Status: DC
  Administered 2016-08-27 – 2016-08-29 (×2): 3 mL via INTRAVENOUS

## 2016-08-27 MED ORDER — INSULIN ASPART 100 UNIT/ML ~~LOC~~ SOLN
0.0000 [IU] | SUBCUTANEOUS | Status: DC
  Administered 2016-08-28: 2 [IU] via SUBCUTANEOUS
  Administered 2016-08-28: 5 [IU] via SUBCUTANEOUS
  Administered 2016-08-29: 3 [IU] via SUBCUTANEOUS

## 2016-08-27 MED ORDER — DEXTROSE 5 % IV SOLN
INTRAVENOUS | Status: AC | PRN
  Administered 2016-08-27: 500 mL via INTRAVENOUS

## 2016-08-27 MED ORDER — SODIUM CHLORIDE 0.9 % IV SOLN
8.0000 mg/h | INTRAVENOUS | Status: DC
  Administered 2016-08-27 – 2016-08-28 (×3): 8 mg/h via INTRAVENOUS
  Filled 2016-08-27 (×7): qty 80

## 2016-08-27 MED ORDER — PANTOPRAZOLE SODIUM 40 MG IV SOLR: 40.0000 mg | Freq: Two times a day (BID) | INTRAVENOUS | Status: DC

## 2016-08-27 MED ORDER — SODIUM CHLORIDE 0.9 % IV SOLN: INTRAVENOUS | Status: DC

## 2016-08-27 SURGICAL SUPPLY — 14 items

## 2016-08-27 NOTE — Progress Notes (Signed)
Hypoglycemic Event  CBG: 61 CBG   Treatment: D50 IV 25 mL  Symptoms: None  Follow-up CBG: Time: 0637 CBG Result 124  Possible Reasons for Event: Pt NPO   Comments:  Patient resting     [name]

## 2016-08-27 NOTE — H&P (Signed)
History and Physical    Rohaan Durnil GQQ:[id number] DOB: [birth year] DOA: 08/27/2016  PCP: Patient, No Pcp Per   Patient coming from: Home, by way of [facility] ED   Chief Complaint: Hematemesis   HPI: Renato Spellman is a 31 y.o. male with medical history significant for insulin-dependent diabetes mellitus, peptic ulcer disease, and depression with anxiety, now presenting to the emergency department with reports of hematemesis. Patient reports that he developed pain in the midabdomen on the morning of 08/26/2016, progressing throughout the day. This was described as moderate in intensity, constant, cramping, nonradiating, and with no alleviating or exacerbating factors identified. There is no fevers or chills and no diarrhea. In the evening, he had 2 episodes of nausea with vomiting, and reports seeing dark blood in both instances. He went into the ED for evaluation of this. He reports history of similar several years ago. He had EGD performed in 2015 under the same circumstances, and this was a normal exam. He had been taking daily Protonix until running out approximately 1 month ago. He was using NSAIDs previously, but stopped. Denies any alcohol use. No history of liver disease.  ED Course: Upon arrival to the ED, patient is found to be afebrile, saturating well on room air, and with vital signs stable. Chemistry panel was notable for a serum creatinine 1.30 and CBC featured a hemoglobin of 14.3 and platelets 129,000. INR was normal. CT of the abdomen and pelvis was obtained in the emergency department and an unremarkable study. Patient was given 80 mg IV Protonix and started on Protonix infusion. He was also treated with 20 mg IV Pepcid, Phenergan, and Dilaudid in the ED. [facility]enterology here at [facility] was consulted by the ED physician. Admission to telemetry unit. [place] was arranged.  Review of Systems:  All other systems reviewed and apart from HPI, are negative.  Past Medical  History:  Diagnosis Date  . Anxiety   . Depression   . Diabetic peripheral neuropathy ([place])   . Hematemesis/vomiting blood admissions 09/2013, 01/02/2014  . Proteinuria   . PTSD (post-traumatic stress disorder)   . Stomach ulcer   . Type I diabetes mellitus ([place])     Past Surgical History:  Procedure Laterality Date  . APPENDECTOMY    . ESOPHAGOGASTRODUODENOSCOPY N/A 01/03/2014   Procedure: ESOPHAGOGASTRODUODENOSCOPY (EGD);  Surgeon: Missy Sabins, MD;  Location: [facility] ENDOSCOPY;  Service: Endoscopy;  Laterality: N/A;     reports that he has never smoked. He has never used smokeless tobacco. He reports that he does not drink alcohol or use drugs.  Allergies  Allergen Reactions  . Banana Anaphylaxis  . Food     Walnut-throat itchy/eyes swells  . Ibuprofen Other (See Comments)    Ulcers but pt does take gel caps and they do not bother his stomach    Family History  Problem Relation Age of Onset  . HIV/AIDS Father   . Drug abuse Father   . Drug abuse Sister   . Diabetes Cousin   . Suicidality Neg Hx   . Bipolar disorder Neg Hx   . Depression Neg Hx   . Anxiety disorder Neg Hx      Prior to Admission medications   Medication Sig Start Date End Date Taking? Authorizing Provider  BD PEN NEEDLE NANO U/F 32G X 4 MM MISC USE AS DIRECTED WITH BASAGLAR 06/12/15   [provider]  blood glucose meter kit and supplies KIT Dispense per patient insurance preference. Use up to  four times daily as directed 02/15/15   Weber, Sarah L, PA-C  gabapentin (NEURONTIN) 300 MG capsule TAKE ONE CAPSULE BY MOUTH 3 TIMES A DAY 01/03/16   Renato Shin, MD  hydrOXYzine (ATARAX/VISTARIL) 25 MG tablet Take 1 tablet (25 mg total) by mouth every 8 (eight) hours as needed for anxiety. 11/21/13   Charlcie Cradle, MD  insulin aspart (NOVOLOG FLEXPEN) 100 UNIT/ML FlexPen Inject 2 Units into the skin 3 (three) times daily with meals. 04/01/15   Renato Shin, MD  Insulin Glargine (BASAGLAR KWIKPEN) 100  UNIT/ML SOPN Inject 0.25 mLs (25 Units total) into the skin at bedtime. 07/18/16   Renato Shin, MD  ipratropium (ATROVENT) 0.06 % nasal spray Place 2 sprays into the nose 3 (three) times daily. 02/15/15   Weber, Damaris Hippo, PA-C  lidocaine (LIDODERM) 5 % Place 1 patch onto the skin daily. Remove & Discard patch within 12 hours or as directed by MD 10/20/15   Margarita Mail, PA-C  meloxicam (MOBIC) 15 MG tablet Take 1 tablet (15 mg total) by mouth daily. Take 1 daily with food. 10/20/15   Harris, Vernie Shanks, PA-C  pantoprazole (PROTONIX) 40 MG tablet TAKE 1 TABLET BY MOUTH TWICE A DAY 01/03/16   Renato Shin, MD  SUMAtriptan (IMITREX) 100 MG tablet Take 1 tablet (100 mg total) by mouth every 2 (two) hours as needed for migraine. May repeat in 2 hours if headache persists or recurs. 02/15/15   Weber, Damaris Hippo, PA-C  traMADol (ULTRAM) 50 MG tablet Take 1 tablet (50 mg total) by mouth every 6 (six) hours as needed. 03/09/16   Doristine Devoid, PA-C    Physical Exam: Vitals:   08/27/16 0217  BP: 117/72  Pulse: 75  Resp: 18  Temp: 97.7 F (36.5 C)  TempSrc: Oral  SpO2: 100%  Weight: 67.5 kg (148 lb 12.8 oz)  Height: _0  (1.676 m)      Constitutional: No acute distress, calm, comfortable Eyes: PERTLA, lids and conjunctivae normal ENMT: Mucous membranes are moist. Posterior pharynx clear of any exudate or lesions.   Neck: normal, supple, no masses, no thyromegaly Respiratory: clear to auscultation bilaterally, no wheezing, no crackles. Normal respiratory effort.   Cardiovascular: S1 & S2 heard, regular rate and rhythm. No extremity edema. No significant JVD. Abdomen: No distension, soft, no masses palpated, tender in mid-abdomen without rebound pain or guarding. Bowel sounds active.  Musculoskeletal: no clubbing / cyanosis. No joint deformity upper and lower extremities.    Skin: no significant rashes, lesions, ulcers. Warm, dry, well-perfused. Neurologic: CN 2-12 grossly intact. Sensation  intact, DTR normal. Strength 5/5 in all 4 limbs.  Psychiatric: Alert and oriented x 3. Calm and cooperative.    Labs on Admission: I have personally reviewed following labs and imaging studies  CBC: No results for input(s): WBC, NEUTROABS, HGB, HCT, MCV, PLT in the last 168 hours. Basic Metabolic Panel: No results for input(s): NA, K, CL, CO2, GLUCOSE, BUN, CREATININE, CALCIUM, MG, PHOS in the last 168 hours. GFR: CrCl cannot be calculated (Patient's most recent lab result is older than the maximum 21 days allowed.). Liver Function Tests: No results for input(s): AST, ALT, ALKPHOS, BILITOT, PROT, ALBUMIN in the last 168 hours. No results for input(s): LIPASE, AMYLASE in the last 168 hours. No results for input(s): AMMONIA in the last 168 hours. Coagulation Profile: No results for input(s): INR, PROTIME in the last 168 hours. Cardiac Enzymes: No results for input(s): CKTOTAL, CKMB, CKMBINDEX, TROPONINI in the last 168 hours.  BNP (last 3 results) No results for input(s): PROBNP in the last 8760 hours. HbA1C: No results for input(s): HGBA1C in the last 72 hours. CBG:  Recent Labs Lab 08/27/16 0221  GLUCAP 72   Lipid Profile: No results for input(s): CHOL, HDL, LDLCALC, TRIG, CHOLHDL, LDLDIRECT in the last 72 hours. Thyroid Function Tests: No results for input(s): TSH, T4TOTAL, FREET4, T3FREE, THYROIDAB in the last 72 hours. Anemia Panel: No results for input(s): VITAMINB12, FOLATE, FERRITIN, TIBC, IRON, RETICCTPCT in the last 72 hours. Urine analysis:    Component Value Date/Time   COLORURINE YELLOW 01/02/2014 [place] 01/02/2014 1340   LABSPEC 1.024 01/02/2014 1340   PHURINE 6.0 01/02/2014 1340   GLUCOSEU >1000 (A) 01/02/2014 1340   HGBUR NEGATIVE 01/02/2014 1340   BILIRUBINUR NEGATIVE 01/02/2014 1340   KETONESUR NEGATIVE 01/02/2014 1340   PROTEINUR NEGATIVE 01/02/2014 1340   UROBILINOGEN 1.0 01/02/2014 1340   NITRITE NEGATIVE 01/02/2014 1340    LEUKOCYTESUR NEGATIVE 01/02/2014 1340   Sepsis Labs: _0 (procalcitonin:4,lacticidven:4) )No results found for this or any previous visit (from the past 240 hour(s)).   Radiological Exams on Admission: No results found.  EKG: Not performed.   Assessment/Plan  1. Hematemesis  - Pt presented to ED at [facility] following 2 episodes of emesis with dark blood at home, reported to have another episode in ED  - Reports hx of PUD, previously on Protonix until running out a month ago; EGD from 2015 was a normal study - Initial Hgb was 14.3 and he remained hemodynamically stable; LFT's and INR wnl  - He was given 80 mg IV Protonix in the ED and started on Protonix infusion  - GI is consulting and much appreciated, will follow-up on recommendations  - Plan to continue Protonix infusion, continue IVF, type & screen, and trend H&H  2. Insulin-dependent DM  - A1c was 8.0% in March 2018  - Managed at home with insulin glargine 25 units qHS, and Novolog 2 units TID  - Check CBG's q4h while NPO  - Start Lantus 15 units qHS and low-intensity Novolog sliding-scale    3. Renal insufficiency, mild  - SCr was 1.30 at the [facility] ED  - Uncertain chronicity  - He is being hydrated with IVF, will repeat chem panel in am    4. Depression, anxiety  - Appears stable  - Continue Zoloft and Vistaril    DVT prophylaxis: SCD's  Code Status: Full  Family Communication: Discussed with patient Disposition Plan: Observe on telemetry Consults called: Gastroenterology Admission status: Observation    Vianne Bulls, MD [facility] Pager [phone]  If 7PM-7AM, please contact night-coverage [email or web address] Password [email or web address]  08/27/2016, 2:47 AM

## 2016-08-27 NOTE — Progress Notes (Signed)
Patient was admitted early this AM after midnight and H and P has been reviewed and I am in current agreement with the Assessment and Plan done by Dr. Antionette Charpyd. Patient is a 31 yo AAM with a PMH of Insulin Dependent Diabetes Mellitus Type I complicated by Peripheral Neuropathy, Hx of PTSD/Anxiety, Depression, Hx of Hematemesis 2/2 to Stomach Ulcers and other comorbids who [facility] with a cc of Abdominal Pain and Hematemesis. Patient states that he ran out of his Pantoprazole about a month ago and was taking Marlin CanaryGoody Powder and NSAIDs for headaches he was having. Of review of Medications it appears the patient was prescribed Meloxicam last year and he had been taking that. Patient stated that he had 2 episodes of Hematemesis so he was made NPO, placed on a Protonix gtt, given IVF with NS at 100 mL/hr x 10 hours, and Gastroenterology was consulted. Patient's Hb/Hct dropped from 13.7/41.4 -> 12.8/39.5. I spoke with Dr. Lavon PaganiniNandigam and she stated Gastroenterology will come by to evaluate the patient. Because Patient is NPO his Home Insulin Regimen was decreased to 15 units. He was  Slightly hypoglycemic with a BS of 61 this AM so he was given a half an AMP of D50W and CBG improved to 124. Will continue to Monitor patient closely and monitor his response to clinical interventions and repeat Blood work in AM.

## 2016-08-27 NOTE — H&P (View-Only) (Signed)
Attending physician's note   I have taken a history, examined the patient and reviewed the chart. I agree with the Advanced Practitioner's note, impression and recommendations. 31 year old male with diabetes transferred from [facility] ER after he presented with hematemesis. Patient remains hemodynamically stable. Hemoglobin trended from 14.3-12.3, some of it may be dilutional with IV fluids. We will proceed with EGD to evaluate hematemesis and exclude upper GI pathology The risks and benefits as well as alternatives of endoscopic procedure(s) have been discussed and reviewed. All questions answered. The patient agrees to proceed.   Damaris Hippo, MD [phone] Mon-Fri 8a-5p [phone] after 5p, weekends, holidays  Referring Provider: [facility]   Primary Care Physician:  Patient, No Pcp Per Primary Gastroenterologist:   Althia Forts  Reason for Consultation:  hematemesis  ASSESSMENT AND PLAN:  Patient is a 31 yo male with DM1 transferred from [facility] ED yesterday with hematemesis. Hemodynamically stable. INR normal. BUN normal. Normal EGD in 2015 for hematemesis. Hgb at [facility] was 14.3, it was drifted to 12.3. BUN normal.  -Continue BID IV PPI -EGD this afternoon. The risks and benefits of EGD were discussed and the patient agrees to proceed.   Mild ABL, hgb 12.3 from 14.3 at [facility] ED. -monitor cbc, transfuse if needed  DM1, serum glucose in 70's     HPI: Tyler [name] is a 31 y.o. male who presented to [facility] ED a couple of days ago for evaluation of hematemesis. Emesis was dark red. He has LUQ stabbing pain, worse in certain positions and with movement. No melena.  Transferred to [facility] for GI evaluation. Hgb down 2 grams. Patient took ONE DOSE of Goody Powders last week. No other NSAIDs. He gives a history of ulcers dating back to age 53 in [place]. He takes daily Protonix but ran out of it a month ago. No hx of GERD. His weight is stable.    Past Medical History:    Diagnosis Date  . Anxiety   . Depression   . Diabetic peripheral neuropathy ([place])   . Hematemesis/vomiting blood admissions 09/2013, 01/02/2014  . Proteinuria   . PTSD (post-traumatic stress disorder)   . Stomach ulcer   . Type I diabetes mellitus ([place])     Past Surgical History:  Procedure Laterality Date  . APPENDECTOMY    . ESOPHAGOGASTRODUODENOSCOPY N/A 01/03/2014   Procedure: ESOPHAGOGASTRODUODENOSCOPY (EGD);  Surgeon: Missy Sabins, MD;  Location: [facility] ENDOSCOPY;  Service: Endoscopy;  Laterality: N/A;    Prior to Admission medications   Medication Sig Start Date End Date Taking? Authorizing Provider  Aspirin-Acetaminophen-Caffeine (GOODY HEADACHE PO) Take 1 packet by mouth 2 (two) times daily as needed (for headaches or pain).   Yes [provider]  insulin aspart (NOVOLOG FLEXPEN) 100 UNIT/ML FlexPen Inject 2 Units into the skin 3 (three) times daily with meals. 04/01/15  Yes Renato Shin, MD  Insulin Glargine (BASAGLAR KWIKPEN) 100 UNIT/ML SOPN Inject 0.25 mLs (25 Units total) into the skin at bedtime. 07/18/16  Yes Renato Shin, MD  pantoprazole (PROTONIX) 40 MG tablet TAKE 1 TABLET BY MOUTH TWICE A DAY Patient taking differently: Take 40 mg by mouth two times a day 01/03/16  Yes Renato Shin, MD  BD PEN NEEDLE NANO U/F 32G X 4 MM MISC USE AS DIRECTED WITH BASAGLAR 06/12/15   [provider]  blood glucose meter kit and supplies KIT Dispense per patient insurance preference. Use up to four times daily as directed 02/15/15   Gale Journey Damaris Hippo, PA-C  Current Facility-Administered Medications  Medication Dose Route Frequency Provider Last Rate Last Dose  . acetaminophen (TYLENOL) tablet 650 mg  650 mg Oral Q6H PRN Opyd, Ilene Qua, MD       Or  . acetaminophen (TYLENOL) suppository 650 mg  650 mg Rectal Q6H PRN Opyd, Ilene Qua, MD      . hydrOXYzine (ATARAX/VISTARIL) tablet 25 mg  25 mg Oral Q8H PRN Opyd, Ilene Qua, MD      . insulin aspart (novoLOG) injection 0-9  Units  0-9 Units Subcutaneous Q4H Opyd, Ilene Qua, MD      . insulin glargine (LANTUS) injection 15 Units  15 Units Subcutaneous QHS Opyd, Timothy S, MD      . ipratropium (ATROVENT) 0.06 % nasal spray 2 spray  2 spray Nasal TID Opyd, Ilene Qua, MD      . morphine 2 MG/ML injection 1-3 mg  1-3 mg Intravenous Q4H PRN Opyd, Ilene Qua, MD      . ondansetron (ZOFRAN) tablet 4 mg  4 mg Oral Q6H PRN Opyd, Ilene Qua, MD       Or  . ondansetron (ZOFRAN) injection 4 mg  4 mg Intravenous Q6H PRN Opyd, Ilene Qua, MD      . pantoprazole (PROTONIX) 80 mg in sodium chloride 0.9 % 250 mL (0.32 mg/mL) infusion  8 mg/hr Intravenous Continuous Opyd, Ilene Qua, MD 25 mL/hr at 08/27/16 0314 8 mg/hr at 08/27/16 0314  . [START ON 08/30/2016] pantoprazole (PROTONIX) injection 40 mg  40 mg Intravenous Q12H Opyd, Timothy S, MD      . sodium chloride flush (NS) 0.9 % injection 3 mL  3 mL Intravenous Q12H Opyd, Ilene Qua, MD   3 mL at 08/27/16 0304    Allergies as of 08/26/2016 - Review Complete 07/09/2016  Allergen Reaction Noted  . 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 Food  09/05/2015  . Ibuprofen Other (See Comments) 09/28/2011    Family History  Problem Relation Age of Onset  . HIV/AIDS Father   . Drug abuse Father   . Drug abuse Sister   . Diabetes Cousin   . Suicidality Neg Hx   . Bipolar disorder Neg Hx   . Depression Neg Hx   . Anxiety disorder Neg Hx     Social History   Social History  . Marital status: Single    Spouse name: N/A  . Number of children: N/A  . Years of education: N/A   Occupational History  . Not on file.   Social History Main Topics  . Smoking status: Never Smoker  . Smokeless tobacco: Never Used  . Alcohol use No  . Drug use: No  . Sexual activity: Yes   Other Topics Concern  . Not on file   Social History Narrative  . No narrative on file    Review of Systems: All systems reviewed and negative except where noted in HPI.  Physical Exam: Vital signs in last 24  hours: Temp:  [97.7 F (36.5 C)] 97.7 F (36.5 C) (08/04 0553) Pulse Rate:  [71-75] 71 (08/04 0553) Resp:  [16-18] 16 (08/04 0553) BP: (116-117)/(64-72) 116/64 (08/04 0553) SpO2:  [100 %] 100 % (08/04 0553) Weight:  [148 lb 12.8 oz (67.5 kg)] 148 lb 12.8 oz (67.5 kg) (08/04 0217) Last BM Date: 08/27/16 General:   Alert, well-developed, white male in NAD leaning over sink Psych:  Pleasant, cooperative. Normal mood and affect. Eyes:  Pupils equal, sclera clear, no icterus.   Conjunctiva pink. Ears:  Normal auditory acuity. Nose:  No deformity, discharge,  or lesions. Neck:  Supple; no masses Lungs:  Clear throughout to auscultation.   No wheezes, crackles, or rhonchi.  Heart:  Regular rate and rhythm; no murmurs, no edema Abdomen:  Soft, non-distended, nontender, BS active, no palp mass    Rectal:  Deferred  Msk:  Symmetrical without gross deformities. . Neurologic:  Alert and  oriented x4;  grossly normal neurologically. Skin:  Intact without significant lesions or rashes. Multiple tatoos..   Intake/Output from previous day: 08/03 0701 - 08/04 0700 In: 166.3 [I.V.:166.3] Out: -  Intake/Output this shift: Total I/O In: 564.6 [I.V.:564.6] Out: -   Lab Results:  Recent Labs  08/27/16 0312 08/27/16 0638  WBC 5.2  --   HGB 13.7 12.8*  HCT 41.4 39.5  PLT 146*  --    BMET  Recent Labs  08/27/16 0312  NA 140  K 3.9  CL 105  CO2 29  GLUCOSE 77  BUN 12  CREATININE 1.18  CALCIUM 9.2   LFT  Recent Labs  08/27/16 0312  PROT 7.7  ALBUMIN 4.0  AST 23  ALT 16*  ALKPHOS 65  BILITOT 1.2   PT/INR  Recent Labs  08/27/16 0312  LABPROT 14.3  INR 1.10  Tye Savoy, NP-C @  08/27/2016, 1:26 PM  Pager number [phone]

## 2016-08-27 NOTE — Interval H&P Note (Signed)
History and Physical Interval Note:  08/27/2016 2:36 PM  Tyler [name]  has presented today for surgery, with the diagnosis of Hematemesis  The various methods of treatment have been discussed with the patient and family. After consideration of risks, benefits and other options for treatment, the patient has consented to  Procedure(s): ESOPHAGOGASTRODUODENOSCOPY (EGD) WITH PROPOFOL with possible interventions (N/A) as a surgical intervention .  The patient's history has been reviewed, patient examined, no change in status, stable for surgery.  I have reviewed the patient's chart and labs.  Questions were answered to the patient's satisfaction.     Kavitha Nandigam

## 2016-08-27 NOTE — Anesthesia Preprocedure Evaluation (Deleted)
Anesthesia Evaluation  Patient identified by MRN, date of birth, ID band Patient awake    Reviewed: Allergy & Precautions, NPO status , Patient's Chart, lab work & pertinent test results  Airway Mallampati: II  TM Distance: >3 FB Neck ROM: Full    Dental no notable dental hx.    Pulmonary neg pulmonary ROS,    Pulmonary exam normal breath sounds clear to auscultation       Cardiovascular negative cardio ROS Normal cardiovascular exam Rhythm:Regular Rate:Normal     Neuro/Psych negative neurological ROS  negative psych ROS   GI/Hepatic Neg liver ROS, PUD,   Endo/Other  diabetes, Insulin Dependent  Renal/GU negative Renal ROS  negative genitourinary   Musculoskeletal negative musculoskeletal ROS (+)   Abdominal   Peds negative pediatric ROS (+)  Hematology negative hematology ROS (+)   Anesthesia Other Findings   Reproductive/Obstetrics negative OB ROS                             Anesthesia Physical Anesthesia Plan  ASA: III  Anesthesia Plan: MAC   Post-op Pain Management:    Induction: Intravenous  PONV Risk Score and Plan: 0  Airway Management Planned: Simple Face Mask  Additional Equipment:   Intra-op Plan:   Post-operative Plan:   Informed Consent: I have reviewed the patients History and Physical, chart, labs and discussed the procedure including the risks, benefits and alternatives for the proposed anesthesia with the patient or authorized representative who has indicated his/her understanding and acceptance.   Dental advisory given  Plan Discussed with: CRNA and Surgeon  Anesthesia Plan Comments:         Anesthesia Quick Evaluation

## 2016-08-27 NOTE — Op Note (Signed)
[facility] Patient Name: Tyler [name] Procedure Date : 08/27/2016 MRN: [id number] Attending MD: Napoleon Form , MD Date of Birth: February 12, 1985 CSN: [id number] Age: 31 Admit Type: Inpatient Procedure:                Upper GI endoscopy Indications:              Recent gastrointestinal bleeding, , Suspected upper                            gastrointestinal bleeding; hematemesis Providers:                Napoleon Form, MD, Carin Hock, RN,                            Tomma Rakers, RN, Oletha Blend, Technician Referring MD:              Medicines:                Fentanyl 75 micrograms IV, Midazolam 3 mg IV,                            Diphenhydramine 37.5 mg IV Complications:            No immediate complications. Estimated Blood Loss:     Estimated blood loss: none. Procedure:                Pre-Anesthesia Assessment:                           - Prior to the procedure, a History and Physical                            was performed, and patient medications and                            allergies were reviewed. The patient's tolerance of                            previous anesthesia was also reviewed. The risks                            and benefits of the procedure and the sedation                            options and risks were discussed with the patient.                            All questions were answered, and informed consent                            was obtained. Prior Anticoagulants: The patient has                            taken no previous anticoagulant or antiplatelet  agents. ASA Grade Assessment: II - A patient with                            mild systemic disease. After reviewing the risks                            and benefits, the patient was deemed in                            satisfactory condition to undergo the procedure.                           After obtaining informed consent, the endoscope was                            passed under direct vision. Throughout the                            procedure, the patient's blood pressure, pulse, and                            oxygen saturations were monitored continuously. The                            EG-[id number] ([id number]) scope was introduced through the                            mouth, and advanced to the second part of duodenum.                            The upper GI endoscopy was accomplished without                            difficulty. The patient tolerated the procedure                            well. Scope In: Scope Out: Findings:      The esophagus was normal.      The stomach was normal.      The examined duodenum was normal. Impression:               - Normal esophagus.                           - Normal stomach.                           - Normal examined duodenum.                           - No specimens collected. Moderate Sedation:      Moderate (conscious) sedation was administered by the endoscopy nurse       and supervised by the endoscopist. The following parameters were       monitored: oxygen saturation, heart rate, blood pressure, and response  to care. Total physician intraservice time was 9 minutes. Recommendation:           - Patient has a contact number available for                            emergencies. The signs and symptoms of potential                            delayed complications were discussed with the                            patient. Return to normal activities tomorrow.                            Written discharge instructions were provided to the                            patient.                           - Resume previous diet.                           - Continue present medications.                           - No aspirin, ibuprofen, naproxen, or other                            non-steroidal anti-inflammatory drugs.                           - Resume previous diet.                            - Continue present medications.                           - No ibuprofen, naproxen, or other non-steroidal                            anti-inflammatory drugs.                           - We will sign off, please call with any questions Procedure Code(s):        --- Professional ---                           [phone], Esophagogastroduodenoscopy, flexible,                            transoral; diagnostic, including collection of                            specimen(s) by brushing or washing, when performed                            (  separate procedure) Diagnosis Code(s):        --- Professional ---                           K92.2, Gastrointestinal hemorrhage, unspecified CPT copyright 2016 American Medical Association. All rights reserved. The codes documented in this report are preliminary and upon coder review may  be revised to meet current compliance requirements. Napoleon FormKavitha V. Nandigam, MD 08/27/2016 3:38:31 PM This report has been signed electronically. Number of Addenda: 0

## 2016-08-27 NOTE — Progress Notes (Signed)
Pt continuously monitored by writer during EGD procedure. Unable to document Vital signs every 5 min due to case ended on monitor, vitals did not automatically flow to chart, unable to retrieve lost data. Vital signs stable per baseline throughout monitoring period.

## 2016-08-27 NOTE — Consult Note (Signed)
Attending physician's note   I have taken a history, examined the patient and reviewed the chart. I agree with the Advanced Practitioner's note, impression and recommendations. 31 year old male with diabetes transferred from [facility] ER after he presented with hematemesis. Patient remains hemodynamically stable. Hemoglobin trended from 14.3-12.3, some of it may be dilutional with IV fluids. We will proceed with EGD to evaluate hematemesis and exclude upper GI pathology The risks and benefits as well as alternatives of endoscopic procedure(s) have been discussed and reviewed. All questions answered. The patient agrees to proceed.   Damaris Hippo, MD [phone] Mon-Fri 8a-5p [phone] after 5p, weekends, holidays  Referring Provider: [facility]   Primary Care Physician:  Patient, No Pcp Per Primary Gastroenterologist:   Althia Forts  Reason for Consultation:  hematemesis  ASSESSMENT AND PLAN:  Patient is a 31 yo male with DM1 transferred from [facility] ED yesterday with hematemesis. Hemodynamically stable. INR normal. BUN normal. Normal EGD in 2015 for hematemesis. Hgb at [facility] was 14.3, it was drifted to 12.3. BUN normal.  -Continue BID IV PPI -EGD this afternoon. The risks and benefits of EGD were discussed and the patient agrees to proceed.   Mild ABL, hgb 12.3 from 14.3 at [facility] ED. -monitor cbc, transfuse if needed  DM1, serum glucose in 70's     HPI: Nate Common is a 31 y.o. male who presented to [facility] ED a couple of days ago for evaluation of hematemesis. Emesis was dark red. He has LUQ stabbing pain, worse in certain positions and with movement. No melena.  Transferred to [facility] for GI evaluation. Hgb down 2 grams. Patient took ONE DOSE of Goody Powders last week. No other NSAIDs. He gives a history of ulcers dating back to age 58 in [place]. He takes daily Protonix but ran out of it a month ago. No hx of GERD. His weight is stable.    Past Medical History:    Diagnosis Date  . Anxiety   . Depression   . Diabetic peripheral neuropathy ([place])   . Hematemesis/vomiting blood admissions 09/2013, 01/02/2014  . Proteinuria   . PTSD (post-traumatic stress disorder)   . Stomach ulcer   . Type I diabetes mellitus ([place])     Past Surgical History:  Procedure Laterality Date  . APPENDECTOMY    . ESOPHAGOGASTRODUODENOSCOPY N/A 01/03/2014   Procedure: ESOPHAGOGASTRODUODENOSCOPY (EGD);  Surgeon: Missy Sabins, MD;  Location: [facility] ENDOSCOPY;  Service: Endoscopy;  Laterality: N/A;    Prior to Admission medications   Medication Sig Start Date End Date Taking? Authorizing Provider  Aspirin-Acetaminophen-Caffeine (GOODY HEADACHE PO) Take 1 packet by mouth 2 (two) times daily as needed (for headaches or pain).   Yes [provider]  insulin aspart (NOVOLOG FLEXPEN) 100 UNIT/ML FlexPen Inject 2 Units into the skin 3 (three) times daily with meals. 04/01/15  Yes Renato Shin, MD  Insulin Glargine (BASAGLAR KWIKPEN) 100 UNIT/ML SOPN Inject 0.25 mLs (25 Units total) into the skin at bedtime. 07/18/16  Yes Renato Shin, MD  pantoprazole (PROTONIX) 40 MG tablet TAKE 1 TABLET BY MOUTH TWICE A DAY Patient taking differently: Take 40 mg by mouth two times a day 01/03/16  Yes Renato Shin, MD  BD PEN NEEDLE NANO U/F 32G X 4 MM MISC USE AS DIRECTED WITH BASAGLAR 06/12/15   [provider]  blood glucose meter kit and supplies KIT Dispense per patient insurance preference. Use up to four times daily as directed 02/15/15   Gale Journey Damaris Hippo, PA-C  Current Facility-Administered Medications  Medication Dose Route Frequency Provider Last Rate Last Dose  . acetaminophen (TYLENOL) tablet 650 mg  650 mg Oral Q6H PRN Opyd, Ilene Qua, MD       Or  . acetaminophen (TYLENOL) suppository 650 mg  650 mg Rectal Q6H PRN Opyd, Ilene Qua, MD      . hydrOXYzine (ATARAX/VISTARIL) tablet 25 mg  25 mg Oral Q8H PRN Opyd, Ilene Qua, MD      . insulin aspart (novoLOG) injection 0-9  Units  0-9 Units Subcutaneous Q4H Opyd, Ilene Qua, MD      . insulin glargine (LANTUS) injection 15 Units  15 Units Subcutaneous QHS Opyd, Timothy S, MD      . ipratropium (ATROVENT) 0.06 % nasal spray 2 spray  2 spray Nasal TID Opyd, Ilene Qua, MD      . morphine 2 MG/ML injection 1-3 mg  1-3 mg Intravenous Q4H PRN Opyd, Ilene Qua, MD      . ondansetron (ZOFRAN) tablet 4 mg  4 mg Oral Q6H PRN Opyd, Ilene Qua, MD       Or  . ondansetron (ZOFRAN) injection 4 mg  4 mg Intravenous Q6H PRN Opyd, Ilene Qua, MD      . pantoprazole (PROTONIX) 80 mg in sodium chloride 0.9 % 250 mL (0.32 mg/mL) infusion  8 mg/hr Intravenous Continuous Opyd, Ilene Qua, MD 25 mL/hr at 08/27/16 0314 8 mg/hr at 08/27/16 0314  . [START ON 08/30/2016] pantoprazole (PROTONIX) injection 40 mg  40 mg Intravenous Q12H Opyd, Timothy S, MD      . sodium chloride flush (NS) 0.9 % injection 3 mL  3 mL Intravenous Q12H Opyd, Ilene Qua, MD   3 mL at 08/27/16 0304    Allergies as of 08/26/2016 - Review Complete 07/09/2016  Allergen Reaction Noted  . 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 Food  09/05/2015  . Ibuprofen Other (See Comments) 09/28/2011    Family History  Problem Relation Age of Onset  . HIV/AIDS Father   . Drug abuse Father   . Drug abuse Sister   . Diabetes Cousin   . Suicidality Neg Hx   . Bipolar disorder Neg Hx   . Depression Neg Hx   . Anxiety disorder Neg Hx     Social History   Social History  . Marital status: Single    Spouse name: N/A  . Number of children: N/A  . Years of education: N/A   Occupational History  . Not on file.   Social History Main Topics  . Smoking status: Never Smoker  . Smokeless tobacco: Never Used  . Alcohol use No  . Drug use: No  . Sexual activity: Yes   Other Topics Concern  . Not on file   Social History Narrative  . No narrative on file    Review of Systems: All systems reviewed and negative except where noted in HPI.  Physical Exam: Vital signs in last 24  hours: Temp:  [97.7 F (36.5 C)] 97.7 F (36.5 C) (08/04 0553) Pulse Rate:  [71-75] 71 (08/04 0553) Resp:  [16-18] 16 (08/04 0553) BP: (116-117)/(64-72) 116/64 (08/04 0553) SpO2:  [100 %] 100 % (08/04 0553) Weight:  [148 lb 12.8 oz (67.5 kg)] 148 lb 12.8 oz (67.5 kg) (08/04 0217) Last BM Date: 08/27/16 General:   Alert, well-developed, white male in NAD leaning over sink Psych:  Pleasant, cooperative. Normal mood and affect. Eyes:  Pupils equal, sclera clear, no icterus.   Conjunctiva pink. Ears:  Normal auditory acuity. Nose:  No deformity, discharge,  or lesions. Neck:  Supple; no masses Lungs:  Clear throughout to auscultation.   No wheezes, crackles, or rhonchi.  Heart:  Regular rate and rhythm; no murmurs, no edema Abdomen:  Soft, non-distended, nontender, BS active, no palp mass    Rectal:  Deferred  Msk:  Symmetrical without gross deformities. . Neurologic:  Alert and  oriented x4;  grossly normal neurologically. Skin:  Intact without significant lesions or rashes. Multiple tatoos..   Intake/Output from previous day: 08/03 0701 - 08/04 0700 In: 166.3 [I.V.:166.3] Out: -  Intake/Output this shift: Total I/O In: 564.6 [I.V.:564.6] Out: -   Lab Results:  Recent Labs  08/27/16 0312 08/27/16 0638  WBC 5.2  --   HGB 13.7 12.8*  HCT 41.4 39.5  PLT 146*  --    BMET  Recent Labs  08/27/16 0312  NA 140  K 3.9  CL 105  CO2 29  GLUCOSE 77  BUN 12  CREATININE 1.18  CALCIUM 9.2   LFT  Recent Labs  08/27/16 0312  PROT 7.7  ALBUMIN 4.0  AST 23  ALT 16*  ALKPHOS 65  BILITOT 1.2   PT/INR  Recent Labs  08/27/16 0312  LABPROT 14.3  INR 1.10  Tye Savoy, NP-C @  08/27/2016, 1:26 PM  Pager number [phone]

## 2016-08-27 NOTE — Progress Notes (Signed)
Patient arrived via stretcher with [facility] from [facility].  Patient is ambulatory and alert and oriented x 4.  Vital signs: temp 97.7; BP: 117/72; HR: 75; Resp: 18 and 100% on room air. Complaints of Pain in abdomen of 7/10.  No orders at the time. respoistion helps.   IV intact to the right forearm with Protonix infusing. Skin assessment completed.  No skin issues.  Educated the patient on how to reach the staff.  Placed the patient on cardiac monitoring.  Lowered the bed and placed the call light within reach.  Will continue to monitor the patient.

## 2016-08-27 NOTE — Progress Notes (Signed)
Hypoglycemic Event  CBG: 66   Treatment: D50 IV 25 mL and D5W at 17625ml/hr per Dr. Sallyanne KusterNadigam verbal order during sedation. This was stopped at 1511 for repeat CBG 150  Symptoms: Vision changes  Follow-up CBG: Time:1511 CBG Result:150  CBG in recovery at  1537: 128  Possible Reasons for Event: inadequate meal intake  Comments/MD notified:Dr. Nandigam in endoscopy preprocedure.    Tyler [name] Tyler [name]

## 2016-08-28 ENCOUNTER — Encounter (HOSPITAL_COMMUNITY): Payer: Self-pay | Admitting: Gastroenterology

## 2016-08-28 DIAGNOSIS — N289 Disorder of kidney and ureter, unspecified: Secondary | ICD-10-CM | POA: Diagnosis not present

## 2016-08-28 DIAGNOSIS — F331 Major depressive disorder, recurrent, moderate: Secondary | ICD-10-CM | POA: Diagnosis not present

## 2016-08-28 DIAGNOSIS — E1021 Type 1 diabetes mellitus with diabetic nephropathy: Secondary | ICD-10-CM | POA: Diagnosis not present

## 2016-08-28 DIAGNOSIS — K922 Gastrointestinal hemorrhage, unspecified: Secondary | ICD-10-CM | POA: Diagnosis not present

## 2016-08-28 DIAGNOSIS — E109 Type 1 diabetes mellitus without complications: Secondary | ICD-10-CM | POA: Diagnosis not present

## 2016-08-28 DIAGNOSIS — K920 Hematemesis: Secondary | ICD-10-CM | POA: Diagnosis not present

## 2016-08-28 DIAGNOSIS — R1084 Generalized abdominal pain: Secondary | ICD-10-CM | POA: Diagnosis not present

## 2016-08-28 DIAGNOSIS — F411 Generalized anxiety disorder: Secondary | ICD-10-CM | POA: Diagnosis not present

## 2016-08-28 DIAGNOSIS — N179 Acute kidney failure, unspecified: Secondary | ICD-10-CM | POA: Diagnosis not present

## 2016-08-28 DIAGNOSIS — F4310 Post-traumatic stress disorder, unspecified: Secondary | ICD-10-CM | POA: Diagnosis not present

## 2016-08-28 LAB — CBC WITH DIFFERENTIAL/PLATELET
BASOS ABS: 0 10*3/uL (ref 0.0–0.1)
BASOS PCT: 0 %
EOS PCT: 2 %
Eosinophils Absolute: 0.1 10*3/uL (ref 0.0–0.7)
HCT: 40.3 % (ref 39.0–52.0)
Hemoglobin: 13 g/dL (ref 13.0–17.0)
Lymphocytes Relative: 47 %
Lymphs Abs: 2.3 10*3/uL (ref 0.7–4.0)
MCH: 26.7 pg (ref 26.0–34.0)
MCHC: 32.3 g/dL (ref 30.0–36.0)
MCV: 82.8 fL (ref 78.0–100.0)
MONO ABS: 0.5 10*3/uL (ref 0.1–1.0)
Monocytes Relative: 9 %
NEUTROS ABS: 2.1 10*3/uL (ref 1.7–7.7)
Neutrophils Relative %: 42 %
PLATELETS: 149 10*3/uL — AB (ref 150–400)
RBC: 4.87 MIL/uL (ref 4.22–5.81)
RDW: 13 % (ref 11.5–15.5)
WBC: 5 10*3/uL (ref 4.0–10.5)

## 2016-08-28 LAB — GLUCOSE, CAPILLARY
GLUCOSE-CAPILLARY: 172 mg/dL — AB (ref 65–99)
GLUCOSE-CAPILLARY: 63 mg/dL — AB (ref 65–99)
GLUCOSE-CAPILLARY: 88 mg/dL (ref 65–99)
GLUCOSE-CAPILLARY: 207 mg/dL — AB (ref 65–99)
Glucose-Capillary: 166 mg/dL — ABNORMAL HIGH (ref 65–99)
Glucose-Capillary: 268 mg/dL — ABNORMAL HIGH (ref 65–99)
Glucose-Capillary: 103 mg/dL — ABNORMAL HIGH (ref 65–99)

## 2016-08-28 LAB — HEMOGLOBIN A1C
HEMOGLOBIN A1C: 8.4 % — AB (ref 4.8–5.6)
MEAN PLASMA GLUCOSE: 194 mg/dL

## 2016-08-28 LAB — COMPREHENSIVE METABOLIC PANEL
ALBUMIN: 3.7 g/dL (ref 3.5–5.0)
ALT: 14 U/L — ABNORMAL LOW (ref 17–63)
AST: 22 U/L (ref 15–41)
Alkaline Phosphatase: 57 U/L (ref 38–126)
Anion gap: 5 (ref 5–15)
BUN: 15 mg/dL (ref 6–20)
CO2: 29 mmol/L (ref 22–32)
Calcium: 8.9 mg/dL (ref 8.9–10.3)
Chloride: 105 mmol/L (ref 101–111)
Creatinine, Ser: 1.48 mg/dL — ABNORMAL HIGH (ref 0.61–1.24)
GFR calc Af Amer: >60 mL/min (ref >60)
GFR calc non Af Amer: >60 mL/min (ref >60)
GLUCOSE: 124 mg/dL — AB (ref 65–99)
POTASSIUM: 4.2 mmol/L (ref 3.5–5.1)
Sodium: 139 mmol/L (ref 135–145)
Total Bilirubin: 1.3 mg/dL — ABNORMAL HIGH (ref 0.3–1.2)
Total Protein: 7.1 g/dL (ref 6.5–8.1)

## 2016-08-28 LAB — PHOSPHORUS: Phosphorus: 3 mg/dL (ref 2.5–4.6)

## 2016-08-28 LAB — MAGNESIUM: Magnesium: 2 mg/dL (ref 1.7–2.4)

## 2016-08-28 MED ORDER — PANTOPRAZOLE SODIUM 40 MG PO TBEC
40.0000 mg | DELAYED_RELEASE_TABLET | Freq: Two times a day (BID) | ORAL | Status: DC
  Administered 2016-08-28 – 2016-08-29 (×3): 40 mg via ORAL
  Filled 2016-08-28 (×3): qty 1

## 2016-08-28 MED ORDER — PHENOL 1.4 % MT LIQD: 1.0000 | OROMUCOSAL | Status: DC | PRN

## 2016-08-28 MED ORDER — ONDANSETRON HCL 4 MG PO TABS: 4.0000 mg | ORAL_TABLET | Freq: Four times a day (QID) | ORAL | 0 refills | Status: DC | PRN

## 2016-08-28 MED ORDER — METOCLOPRAMIDE HCL 5 MG/ML IJ SOLN: 10.0000 mg | Freq: Three times a day (TID) | INTRAMUSCULAR | Status: DC

## 2016-08-28 MED ORDER — METOCLOPRAMIDE HCL 5 MG PO TABS: 5.0000 mg | ORAL_TABLET | Freq: Three times a day (TID) | ORAL | 0 refills | Status: DC

## 2016-08-28 MED ORDER — TRAMADOL HCL 50 MG PO TABS
50.0000 mg | ORAL_TABLET | Freq: Four times a day (QID) | ORAL | Status: DC | PRN
  Administered 2016-08-28: 50 mg via ORAL
  Filled 2016-08-28: qty 1

## 2016-08-28 MED ORDER — METOCLOPRAMIDE HCL 5 MG/ML IJ SOLN
10.0000 mg | Freq: Four times a day (QID) | INTRAMUSCULAR | Status: DC
  Administered 2016-08-28 – 2016-08-29 (×4): 10 mg via INTRAVENOUS
  Filled 2016-08-28 (×4): qty 2

## 2016-08-28 MED ORDER — SODIUM CHLORIDE 0.9 % IV SOLN
INTRAVENOUS | Status: DC
  Administered 2016-08-28 – 2016-08-29 (×3): via INTRAVENOUS

## 2016-08-28 MED ORDER — PANTOPRAZOLE SODIUM 40 MG PO TBEC: DELAYED_RELEASE_TABLET | ORAL | 0 refills | Status: DC

## 2016-08-28 MED ORDER — ACETAMINOPHEN 325 MG PO TABS: 650.0000 mg | ORAL_TABLET | Freq: Four times a day (QID) | ORAL | 0 refills | Status: DC | PRN

## 2016-08-28 MED ORDER — SODIUM CHLORIDE 0.9 % IV BOLUS (SEPSIS)
500.0000 mL | Freq: Once | INTRAVENOUS | Status: AC
  Administered 2016-08-28: 500 mL via INTRAVENOUS

## 2016-08-28 NOTE — Progress Notes (Signed)
RN notified CCMD to assess any telemetry issues overnight. Per CCMD, patient has been NSR with no issues. Telemetry discontinued per order.  P.J. Henderson Newcomer[place]exton, RN

## 2016-08-28 NOTE — Progress Notes (Signed)
PROGRESS NOTE    Tyler CahillMarcus [name]  UJW:[id number] DOB: 25-Jan-1986 DOA: 08/27/2016 PCP: Patient, No Pcp Per   Brief Narrative:  Patient is a 31 yo AAM with a PMH of Insulin Dependent Diabetes Mellitus Type I complicated by Peripheral Neuropathy, Hx of PTSD/Anxiety, Depression, PUD, Hx of Hematemesis 2/2 to Stomach Ulcers and other comorbids who [facility] with a cc of Abdominal Pain and Hematemesis. Patient reports that he developed pain in the midabdomen on the morning of 08/26/2016, progressing throughout the day. This was described as moderate in intensity, constant, cramping, nonradiating, and with no alleviating or exacerbating factors identified. There was no fevers or chills and no diarrhea. In the evening, he had 2 episodes of nausea with vomiting, and reports seeing dark blood in both instances. He went into the ED for evaluation of this. He reports history of similar several years ago. He had EGD performed in 2015 under the same circumstances, and this was a normal exam. Patient states that he ran out of his Pantoprazole about a month ago and was taking Goody Powder and NSAIDs for headaches he was having. No history of Liver Disease Patient was admitted for Hematemesis and underwent EGD which was unrevealing. Patient was still complaining of Abdominal Pain and it was suspected to Gastroparesis vs. Brittle Diabetes so patient was placed on IV Reglan and Soft Diet. Hospitalization has been complicated by AKI.   Assessment & Plan:   Principal Problem:   Upper GI bleed Active Problems:   GAD (generalized anxiety disorder)   Major depressive disorder, recurrent episode, moderate (HCC)   PTSD (post-traumatic stress disorder)   Hematemesis with nausea   Renal insufficiency, mild   Insulin dependent type 1 diabetes mellitus (HCC)  1. Hematemesis  - [facility] following 2 episodes of emesis with dark blood at home, reported to have another episode in  ED  - Reports hx of PUD, previously on Protonix until running out a month ago; EGD from 2015 was a normal study - Initial Hgb was 14.3 and he remained hemodynamically stable; LFT's and INR wnl  - He was given 80 mg IV Protonix in the ED and started on Protonix infusion; Will change to Protonix po BID given normal EGDU - GI Dr. Lavon PaganiniNandigam is consulting and much appreciated, will follow-up on recommendations  - Hb/Hct Has remained stable - Repeat CBC in AM  2. Insulin-dependent DM  - A1c was 8.0% in March 2018  - Managed at home with insulin glargine 25 units qHS, and Novolog 2 units TID  - Check CBG's q4h  - Start Lantus 15 units qHS and low-intensity Novolog sliding-scale   - CBG's ranging from 63-207 - Check HbA1c  3. Renal insufficiency/AKI - Uncertain Chronicity  - [facility];  - BUN/Cr went from 12/1.18 -> 15/1.48 - Restarted IVF with NS at 100 mL/hr - Uncertain Chronicity  - Repeat CMP in AM  4. Depression/Anxiety  - Appears stable  - Continue Hydroxyzine 24 mg po q8hprn  5. Abdominal Pain concern for Diabetic Gastroparesis vs. Brittle Diabetes - Check HbA1c - C/w Antiemetics with Zofran po/IV 4 mg q6hprn - Added Metoclopramide 10 mg IV q6h  - C/w IVF as above - Avoid Narcotics if possible - Soft Low Fiber Diet - Continue to Monitor  DVT prophylaxis: SCDs given reported Hematemesis  Code Status: FULL CODE Family Communication: None Disposition Plan: Anticipate D/C Home in next 24-48 hours.  Consultants:   Gastroenterology Dr. Lavon Paganini    Procedures:  EGD Findings:      The esophagus was normal.      The stomach was normal.      The examined duodenum was normal. Impression:               - Normal esophagus.                           - Normal stomach.                           - Normal examined duodenum.                           - No specimens collected.   Antimicrobials:  Anti-infectives    None     Subjective: Seen and  examined this AM and was still having Abdominal Pain. Had not been really eating this AM. Denied any CP or SOB. No other complaints or concerns and states he did not like the way Morphine made him feel.  Objective: Vitals:   08/27/16 1548 08/27/16 1600 08/27/16 2049 08/28/16 0600  BP: 110/66 110/61 115/72 120/73  Pulse: 80 78 95 86  Resp: 10 (!) 9 18 18   Temp: 98.6 F (37 C)  98.4 F (36.9 C) 98.2 F (36.8 C)  TempSrc: Oral  Oral Oral  SpO2: 100% 97% 100% 98%  Weight:      Height:        Intake/Output Summary (Last 24 hours) at 08/28/16 1225 Last data filed at 08/28/16 0750  Gross per 24 hour  Intake           280.42 ml  Output                0 ml  Net           280.42 ml   Filed Weights   08/27/16 0217  Weight: 67.5 kg (148 lb 12.8 oz)   Examination: Physical Exam:  Constitutional: WN/WD AAM NAD and appears calm but slightly uncomfortable due to abdominal pain Eyes:  Lids and conjunctivae normal, sclerae anicteric  ENMT: External Ears, Nose appear normal. Grossly normal hearing. Mucous membranes are moist.  Neck: Appears normal, supple, no cervical masses, normal ROM, no appreciable thyromegaly, no JVD Respiratory: Clear to auscultation bilaterally, no wheezing, rales, rhonchi or crackles. Normal respiratory effort and patient is not tachypenic. No accessory muscle use.  Cardiovascular: RRR, no murmurs / rubs / gallops. S1 and S2 auscultated. No extremity edema.  Abdomen: Soft, Tender to palpate mid abdomen and left side, non-distended. No masses palpated. No appreciable hepatosplenomegaly. Bowel sounds positive.  GU: Deferred. Musculoskeletal: No clubbing / cyanosis of digits/nails. No joint deformity upper and lower extremities. Good ROM, no contractures. Normal strength and muscle tone.  Skin: No rashes, lesions, ulcers on a limited skin eval. No induration; Warm and dry.  Neurologic: CN 2-12 grossly intact with no focal deficits. Strength 5/5 in all 4. Romberg sign  cerebellar reflexes not assessed.  Psychiatric: Normal judgment and insight. Alert and oriented x 3. Normal mood and appropriate affect.   Data Reviewed: I have personally reviewed following labs and imaging studies  CBC:  Recent Labs Lab 08/27/16 0312 08/27/16 0638 08/28/16 0503  WBC 5.2  --  5.0  NEUTROABS  --   --  2.1  HGB 13.7  12.8* 13.0  HCT 41.4 39.5 40.3  MCV 81.5  --  82.8  PLT 146*  --  149*   Basic Metabolic Panel:  Recent Labs Lab 08/27/16 0312 08/28/16 0503  NA 140 139  K 3.9 4.2  CL 105 105  CO2 29 29  GLUCOSE 77 124*  BUN 12 15  CREATININE 1.18 1.48*  CALCIUM 9.2 8.9  MG  --  2.0  PHOS  --  3.0   GFR: Estimated Creatinine Clearance: 64.1 mL/min (A) (by C-G formula based on SCr of 1.48 mg/dL (H)). Liver Function Tests:  Recent Labs Lab 08/27/16 0312 08/28/16 0503  AST 23 22  ALT 16* 14*  ALKPHOS 65 57  BILITOT 1.2 1.3*  PROT 7.7 7.1  ALBUMIN 4.0 3.7   No results for input(s): LIPASE, AMYLASE in the last 168 hours. No results for input(s): AMMONIA in the last 168 hours. Coagulation Profile:  Recent Labs Lab 08/27/16 0312  INR 1.10   Cardiac Enzymes: No results for input(s): CKTOTAL, CKMB, CKMBINDEX, TROPONINI in the last 168 hours. BNP (last 3 results) No results for input(s): PROBNP in the last 8760 hours. HbA1C: No results for input(s): HGBA1C in the last 72 hours. CBG:  Recent Labs Lab 08/28/16 0117 08/28/16 0420 08/28/16 0746 08/28/16 0805 08/28/16 1201  GLUCAP 166* 172* 63* 88 207*   Lipid Profile: No results for input(s): CHOL, HDL, LDLCALC, TRIG, CHOLHDL, LDLDIRECT in the last 72 hours. Thyroid Function Tests: No results for input(s): TSH, T4TOTAL, FREET4, T3FREE, THYROIDAB in the last 72 hours. Anemia Panel: No results for input(s): VITAMINB12, FOLATE, FERRITIN, TIBC, IRON, RETICCTPCT in the last 72 hours. Sepsis Labs: No results for input(s): PROCALCITON, LATICACIDVEN in the last 168 hours.  No results found  for this or any previous visit (from the past 240 hour(s)).   Radiology Studies: No results found.  Scheduled Meds: . insulin aspart  0-9 Units Subcutaneous Q4H  . insulin glargine  15 Units Subcutaneous QHS  . ipratropium  2 spray Nasal TID  . metoCLOPramide (REGLAN) injection  10 mg Intravenous Q8H  . [START ON 08/30/2016] pantoprazole  40 mg Intravenous Q12H  . sodium chloride flush  3 mL Intravenous Q12H   Continuous Infusions: . sodium chloride 100 mL/hr at 08/28/16 1042  . pantoprozole (PROTONIX) infusion 8 mg/hr (08/28/16 0125)    LOS: 0 days   Merlene Laughtermair Latif Giavanni Zeitlin, DO [facility] Pager [phone]  If 7PM-7AM, please contact night-coverage [email or web address] Password [email or web address] 08/28/2016, 12:25 PM

## 2016-08-28 NOTE — Progress Notes (Signed)
Hypoglycemic Event  CBG: 63  Treatment: 15 GM carbohydrate snack  Symptoms: Nervous/irritable  Follow-up CBG: Time:0805 CBG Result:88  Possible Reasons for Event: Inadequate meal intake  Comments/MD notified: Yes    Lorene Dyaven I Faduma Cho

## 2016-08-29 ENCOUNTER — Encounter: Payer: Self-pay | Admitting: Physician Assistant

## 2016-08-29 DIAGNOSIS — K920 Hematemesis: Secondary | ICD-10-CM | POA: Diagnosis not present

## 2016-08-29 DIAGNOSIS — F411 Generalized anxiety disorder: Secondary | ICD-10-CM | POA: Diagnosis not present

## 2016-08-29 DIAGNOSIS — E109 Type 1 diabetes mellitus without complications: Secondary | ICD-10-CM | POA: Diagnosis not present

## 2016-08-29 DIAGNOSIS — E1021 Type 1 diabetes mellitus with diabetic nephropathy: Secondary | ICD-10-CM

## 2016-08-29 DIAGNOSIS — K922 Gastrointestinal hemorrhage, unspecified: Secondary | ICD-10-CM | POA: Diagnosis not present

## 2016-08-29 LAB — COMPREHENSIVE METABOLIC PANEL
ALBUMIN: 3.4 g/dL — AB (ref 3.5–5.0)
ALK PHOS: 52 U/L (ref 38–126)
ALT: 12 U/L — ABNORMAL LOW (ref 17–63)
ANION GAP: 5 (ref 5–15)
AST: 18 U/L (ref 15–41)
BUN: 11 mg/dL (ref 6–20)
CALCIUM: 8.5 mg/dL — AB (ref 8.9–10.3)
CO2: 26 mmol/L (ref 22–32)
Chloride: 108 mmol/L (ref 101–111)
Creatinine, Ser: 1.18 mg/dL (ref 0.61–1.24)
GFR calc Af Amer: >60 mL/min (ref >60)
GFR calc non Af Amer: >60 mL/min (ref >60)
GLUCOSE: 103 mg/dL — AB (ref 65–99)
Potassium: 3.6 mmol/L (ref 3.5–5.1)
SODIUM: 139 mmol/L (ref 135–145)
Total Bilirubin: 0.7 mg/dL (ref 0.3–1.2)
Total Protein: 6.3 g/dL — ABNORMAL LOW (ref 6.5–8.1)

## 2016-08-29 LAB — CBC WITH DIFFERENTIAL/PLATELET
BASOS ABS: 0 10*3/uL (ref 0.0–0.1)
BASOS PCT: 0 %
EOS ABS: 0.1 10*3/uL (ref 0.0–0.7)
Eosinophils Relative: 2 %
HCT: 37.2 % — ABNORMAL LOW (ref 39.0–52.0)
HEMOGLOBIN: 12 g/dL — AB (ref 13.0–17.0)
Lymphocytes Relative: 41 %
Lymphs Abs: 1.9 10*3/uL (ref 0.7–4.0)
MCH: 26.4 pg (ref 26.0–34.0)
MCHC: 32.3 g/dL (ref 30.0–36.0)
MCV: 81.8 fL (ref 78.0–100.0)
MONOS PCT: 12 %
Monocytes Absolute: 0.6 10*3/uL (ref 0.1–1.0)
NEUTROS PCT: 45 %
Neutro Abs: 2 10*3/uL (ref 1.7–7.7)
Platelets: 126 10*3/uL — ABNORMAL LOW (ref 150–400)
RBC: 4.55 MIL/uL (ref 4.22–5.81)
RDW: 12.7 % (ref 11.5–15.5)
WBC: 4.5 10*3/uL (ref 4.0–10.5)

## 2016-08-29 LAB — PHOSPHORUS: Phosphorus: 3.4 mg/dL (ref 2.5–4.6)

## 2016-08-29 LAB — MAGNESIUM: Magnesium: 1.7 mg/dL (ref 1.7–2.4)

## 2016-08-29 LAB — GLUCOSE, CAPILLARY
GLUCOSE-CAPILLARY: 241 mg/dL — AB (ref 65–99)
GLUCOSE-CAPILLARY: 86 mg/dL (ref 65–99)
GLUCOSE-CAPILLARY: 90 mg/dL (ref 65–99)
Glucose-Capillary: 70 mg/dL (ref 65–99)

## 2016-08-29 MED ORDER — BASAGLAR KWIKPEN 100 UNIT/ML ~~LOC~~ SOPN: 15.0000 [IU] | PEN_INJECTOR | Freq: Every day | SUBCUTANEOUS | 2 refills | Status: DC

## 2016-08-29 MED ORDER — PANTOPRAZOLE SODIUM 40 MG PO TBEC: DELAYED_RELEASE_TABLET | ORAL | 0 refills | Status: DC

## 2016-08-29 MED ORDER — INSULIN ASPART 100 UNIT/ML FLEXPEN: 2.0000 [IU] | PEN_INJECTOR | Freq: Three times a day (TID) | SUBCUTANEOUS | 2 refills | Status: DC

## 2016-08-29 MED ORDER — BLOOD GLUCOSE MONITOR KIT: PACK | 0 refills | Status: DC

## 2016-08-29 NOTE — Progress Notes (Signed)
Patient discharged to home around 1500. Discharge instructions provided to wife, both verbalized understanding. Prescriptions sent to [facility] on [address] to be picked up from wife. IV removed and intact.   Sherlon HandingElisa Yajaira Doffing, RN

## 2016-08-29 NOTE — Discharge Summary (Signed)
Physician Discharge Summary  Monta Police [id number]:[id number] DOB: 1986-01-09 DOA: 08/27/2016  PCP: Patient, No Pcp Per  Admit date: 08/27/2016 Discharge date: 08/29/2016  Admitted From: Home Disposition:  Home  Recommendations for Outpatient Follow-up:  1. Follow up with PCP Dr. Renato Shin in 1-2 weeks 2. Follow up with [facility] within 1 week 3. Please obtain CMP/CBC, Mag, Phos in one week 4. Consider Gastric Emptying study as an outpatient once patient is off of Reglan; Defer continuing Reglan to PCP/Gastroenterolgy  Home Health: No  Equipment/Devices: None  Discharge Condition: Stable  CODE STATUS: FULL CODE Diet recommendation: Carb Modified Soft Low Fiber Diet  Brief/Interim Summary: Patient is a 31 yo AAM with a PMH of Insulin Dependent Diabetes Mellitus Type I complicated by Peripheral Neuropathy, Hx of PTSD/Anxiety, Depression, PUD, Hx of Hematemesis 2/2 to Stomach Ulcers and other comorbids who presented [facility] ED with a cc of Abdominal Pain and Hematemesis. Patient reports that he developed pain in the midabdomen on the morning of 08/26/2016, progressing throughout the day. This was described as moderate in intensity, constant, cramping, nonradiating, and with no alleviating or exacerbating factors identified. There was no fevers or chills and no diarrhea. In the evening, he had 2 episodes of nausea with vomiting, and reports seeing dark blood in both instances. He went into the ED for evaluation of this. He reports history of similar several years ago. He had EGD performed in 2015 under the same circumstances, and this was a normal exam. Patient states that he ran out of his Pantoprazole about a month ago and was taking Goody Powder and NSAIDs for headaches he was having. No history of Liver Disease Patient was admitted for Hematemesis and underwent EGD which was unrevealing. Patient was still complaining of Abdominal Pain yesterday and it was suspected to  Gastroparesis vs. Brittle Diabetes so patient was placed on IV Reglan and Soft Diet. Hospitalization has been complicated by AKI with ?CKD but it improved with IVF. Patient's Abdominal Pain improved and patient tolerated his diet well. He was deemed medically stable and will be D/C'd Home and will need to follow up with PCP and Gastroenterology as an outpatient.   Discharge Diagnoses:  Principal Problem:   Upper GI bleed Active Problems:   GAD (generalized anxiety disorder)   Major depressive disorder, recurrent episode, moderate (HCC)   PTSD (post-traumatic stress disorder)   Hematemesis with nausea   Renal insufficiency, mild   Insulin dependent type 1 diabetes mellitus ([place])  1. Hematemesis, improved - Pt presented to ED at [facility] following 2 episodes of emesis with dark blood at home, reported to have another episode in ED  - Reports Hx of PUD, previously on Protonix until running out a month ago; EGD from 2015 was a normal study - Initial Hgb was 14.3 and he remained hemodynamically stable; LFT's and INR wnl  - He was given 80 mg IV Protonix in the ED and started on Protonix infusion; Will change to Protonix po BID given normal EGD - GI Dr. Silverio Decamp is consulting and much appreciated, will follow-up on recommendations  - EGD as below  - Hb/Hct went from 12.8/39.5 -> 13.0/40.3 -> 12.0/37.2 (Likely drop from IVF Rehydration) - Repeat CBC as an outpatient with PCP and Gastroenterology  - Avoid NSAIDs,   2. Insulin-dependent DM  - A1c was 8.0% in March 2018; Repeat was 8.4 this Admission  - Managed at home with insulin glargine 25 units qHS, and Novolog 2 units TID;  Changed Home Lantus 15 units qHS; Refilled Novolog - Checked CBG's and ranging from 70-241 - Maintain BS <200 - Follow up with PCP for continued Blood Sugar Control and further Insulin Adjustment  3. Renal insufficiency/AKI, improved ? Some Component of CKD given Uncontrolled DM - Uncertain Chronicity  - SCr was 1.30  at the [facility] ED;  - BUN/Cr went from 12/1.18 -> 15/1.48 -> 11/1.18 - Restarted IVF with NS at 100 mL/hr - Repeat CMP as an outpatient   4. Depression/Anxiety  - Appears stable  - Continue Hydroxyzine 24 mg po q8hprn  5. Abdominal Pain concern for Diabetic Gastroparesis vs. Brittle Diabetes; improved - Checked HbA1c and was 8.4 - C/w Antiemetics with Zofran po as an outpatient  - Added Metoclopramide 10 mg IV q6h; Transitioned to po Reglan 5 mg po TIDwm  - Was given IVF as above - Avoid Narcotics if possible - Soft Low Fiber Diet - Maintain BS <200 - Continue to Monitor and follow up with [facility] within 1-2 weeks  Discharge Instructions  Discharge Instructions    Call MD for:  difficulty breathing, headache or visual disturbances    Complete by:  As directed    Call MD for:  extreme fatigue    Complete by:  As directed    Call MD for:  persistant dizziness or light-headedness    Complete by:  As directed    Call MD for:  persistant nausea and vomiting    Complete by:  As directed    Call MD for:  redness, tenderness, or signs of infection (pain, swelling, redness, odor or green/yellow discharge around incision site)    Complete by:  As directed    Call MD for:  severe uncontrolled pain    Complete by:  As directed    Call MD for:  temperature >100.4    Complete by:  As directed    Diet Carb Modified    Complete by:  As directed    Soft Low Fiber Diet   Discharge instructions    Complete by:  As directed    Follow up with PCP and Gastroenterology as an outpatient. Take all medications as prescribed. If symptoms change or worsen please return to the ED for evaluation.   Increase activity slowly    Complete by:  As directed      Allergies as of 08/29/2016      Reactions   Banana Anaphylaxis   Food Itching, Swelling   Walnuts- THROAT ITCHES AND EYES SWELL   Ibuprofen Other (See Comments)   ULCERS   Nsaids Other (See Comments)   ULCERS       Medication List    STOP taking these medications   GOODY HEADACHE PO     TAKE these medications   acetaminophen 325 MG tablet Commonly known as:  TYLENOL Take 2 tablets (650 mg total) by mouth every 6 (six) hours as needed for mild pain (or Fever >/= 101).   BASAGLAR KWIKPEN 100 UNIT/ML Sopn Inject 0.25 mLs (25 Units total) into the skin at bedtime.   BD PEN NEEDLE NANO U/F 32G X 4 MM Misc Generic drug:  Insulin Pen Needle USE AS DIRECTED WITH BASAGLAR   blood glucose meter kit and supplies Kit Dispense per patient insurance preference. Use up to four times daily as directed   insulin aspart 100 UNIT/ML FlexPen Commonly known as:  NOVOLOG FLEXPEN Inject 2 Units into the skin 3 (three) times daily with meals.   metoCLOPramide 5  MG tablet Commonly known as:  REGLAN Take 1 tablet (5 mg total) by mouth 3 (three) times daily before meals. Notes to patient:  Take before meals.   ondansetron 4 MG tablet Commonly known as:  ZOFRAN Take 1 tablet (4 mg total) by mouth every 6 (six) hours as needed for nausea.   pantoprazole 40 MG tablet Commonly known as:  PROTONIX Take 40 mg by mouth two times a day      Follow-up Information    [facility]. Call in 1 week(s).   Specialty:  Gastroenterology Why:  Call to schedule a follow up Appointment within 1 week Contact information: [place] [id number] [phone]         Allergies  Allergen Reactions  . Banana Anaphylaxis  . Food Itching and Swelling    Walnuts- THROAT ITCHES AND EYES SWELL  . Ibuprofen Other (See Comments)    ULCERS  . Nsaids Other (See Comments)    ULCERS    Consultations: Gastroenterology Dr. Silverio Decamp  Procedures/Studies:  No results found. EGD Findings:      The esophagus was normal.      The stomach was normal.      The examined duodenum was normal. Impression:               - Normal esophagus.                           - Normal  stomach.                           - Normal examined duodenum.                           - No specimens collected.  Subjective: Seen and examined at bedside and stated Abdominal pain improved but still had a little bit. No nausea or vomiting. Wanting to go home.  Discharge Exam: Vitals:   08/28/16 2129 08/29/16 0507  BP: 109/62 105/61  Pulse: 72 73  Resp: 20 20  Temp: 98.2 F (36.8 C) 98.3 F (36.8 C)   Vitals:   08/28/16 0600 08/28/16 1406 08/28/16 2129 08/29/16 0507  BP: 120/73 (!) 143/88 109/62 105/61  Pulse: 86 88 72 73  Resp: 18 17 20 20   Temp: 98.2 F (36.8 C) 99.5 F (37.5 C) 98.2 F (36.8 C) 98.3 F (36.8 C)  TempSrc: Oral Oral Oral Oral  SpO2: 98% 98% 97% 95%  Weight:      Height:       General: Pt is alert, awake, not in acute distress Cardiovascular: RRR, S1/S2 +, no rubs, no gallops Respiratory: CTA bilaterally, no wheezing, no rhonchi Abdominal: Soft, Mildly tender, ND, bowel sounds + Extremities: no edema, no cyanosis  The results of significant diagnostics from this hospitalization (including imaging, microbiology, ancillary and laboratory) are listed below for reference.    Microbiology: No results found for this or any previous visit (from the past 240 hour(s)).   Labs: BNP (last 3 results) No results for input(s): BNP in the last 8760 hours. Basic Metabolic Panel:  Recent Labs Lab 08/27/16 0312 08/28/16 0503 08/29/16 0633  NA 140 139 139  K 3.9 4.2 3.6  CL 105 105 108  CO2 29 29 26   GLUCOSE 77 124* 103*  BUN 12 15 11   CREATININE 1.18 1.48* 1.18  CALCIUM 9.2 8.9 8.5*  MG  --  2.0 1.7  PHOS  --  3.0 3.4   Liver Function Tests:  Recent Labs Lab 08/27/16 0312 08/28/16 0503 08/29/16 0633  AST 23 22 18   ALT 16* 14* 12*  ALKPHOS 65 57 52  BILITOT 1.2 1.3* 0.7  PROT 7.7 7.1 6.3*  ALBUMIN 4.0 3.7 3.4*   No results for input(s): LIPASE, AMYLASE in the last 168 hours. No results for input(s): AMMONIA in the last 168  hours. CBC:  Recent Labs Lab 08/27/16 0312 08/27/16 0638 08/28/16 0503 08/29/16 0633  WBC 5.2  --  5.0 4.5  NEUTROABS  --   --  2.1 2.0  HGB 13.7 12.8* 13.0 12.0*  HCT 41.4 39.5 40.3 37.2*  MCV 81.5  --  82.8 81.8  PLT 146*  --  149* 126*   Cardiac Enzymes: No results for input(s): CKTOTAL, CKMB, CKMBINDEX, TROPONINI in the last 168 hours. BNP: Invalid input(s): POCBNP CBG:  Recent Labs Lab 08/28/16 1945 08/29/16 0015 08/29/16 0357 08/29/16 0801 08/29/16 1218  GLUCAP 103* 90 86 70 241*   D-Dimer No results for input(s): DDIMER in the last 72 hours. Hgb A1c  Recent Labs  08/27/16 0312  HGBA1C 8.4*   Lipid Profile No results for input(s): CHOL, HDL, LDLCALC, TRIG, CHOLHDL, LDLDIRECT in the last 72 hours. Thyroid function studies No results for input(s): TSH, T4TOTAL, T3FREE, THYROIDAB in the last 72 hours.  Invalid input(s): FREET3 Anemia work up No results for input(s): VITAMINB12, FOLATE, FERRITIN, TIBC, IRON, RETICCTPCT in the last 72 hours. Urinalysis    Component Value Date/Time   COLORURINE YELLOW 01/02/2014 1340   APPEARANCEUR CLEAR 01/02/2014 1340   LABSPEC 1.024 01/02/2014 1340   PHURINE 6.0 01/02/2014 1340   GLUCOSEU >1000 (A) 01/02/2014 1340   HGBUR NEGATIVE 01/02/2014 1340   BILIRUBINUR NEGATIVE 01/02/2014 1340   KETONESUR NEGATIVE 01/02/2014 1340   PROTEINUR NEGATIVE 01/02/2014 1340   UROBILINOGEN 1.0 01/02/2014 1340   NITRITE NEGATIVE 01/02/2014 1340   LEUKOCYTESUR NEGATIVE 01/02/2014 1340   Sepsis Labs Invalid input(s): PROCALCITONIN,  WBC,  LACTICIDVEN Microbiology No results found for this or any previous visit (from the past 240 hour(s)).  Time coordinating discharge: 35 minutes  SIGNED:  Kerney Elbe, DO [facility] 08/29/2016, 12:42 PM Pager [phone]  If 7PM-7AM, please contact night-coverage [email or web address] Password [email or web address]

## 2016-09-05 ENCOUNTER — Ambulatory Visit (INDEPENDENT_AMBULATORY_CARE_PROVIDER_SITE_OTHER): Payer: BLUE CROSS/BLUE SHIELD | Admitting: Endocrinology

## 2016-09-05 ENCOUNTER — Encounter: Payer: Self-pay | Admitting: Endocrinology

## 2016-09-05 VITALS — BP 122/84 | HR 73 | Wt 155.6 lb

## 2016-09-05 DIAGNOSIS — Z794 Long term (current) use of insulin: Secondary | ICD-10-CM | POA: Diagnosis not present

## 2016-09-05 DIAGNOSIS — E119 Type 2 diabetes mellitus without complications: Secondary | ICD-10-CM

## 2016-09-05 NOTE — Patient Instructions (Addendum)
Please continue the lantus: 25 units on nights when you don't work, and 12 units on nights when you work. Please also take 2 units of humalog if you eat a big meal.   On this type of insulin schedule, you should eat meals on a regular schedule.  If a meal is missed or significantly delayed, your blood sugar could go low. check your blood sugar 4 times a day.  vary the time of day when you check, between before the 3 meals, and at bedtime.  also check if you have symptoms of your blood sugar being too high or too low.  please keep a record of the readings and bring it to your next appointment here.  You can write it on any piece of paper.  please call us sooner if your blood sugar goes below 70, or if you have a lot of readings over 200.   please call [phone] Shon Hough([place] office), to get an appointment with a new primary doctor.   The next step in your diabetes care is to address the stomach problems.  Please come back for a follow-up appointment in 2 months.

## 2016-09-05 NOTE — Progress Notes (Signed)
Subjective:    Patient ID: Tyler [name], male    DOB: 1985/05/15, 31 y.o.   MRN: [id number]  HPI Pt returns for f/u of diabetes mellitus:  DM type: 1 Dx'ed: 1859 Complications: nephropathy.  Therapy: insulin since soon after dx.  DKA: never Severe hypoglycemia: never.  Pancreatitis: never.  Other: he declines multiple daily injections; he works 3rd shift, [employer]; he declines pump rx for now.   Interval history: He was recently in the hospital for n/v.  Nausea persists. no cbg record, but states cbg's vary from 80-120.  He says it is still higher when he works.   Past Medical History:  Diagnosis Date  . Anxiety   . Depression   . Diabetic peripheral neuropathy ([place])   . Hematemesis/vomiting blood admissions 09/2013, 01/02/2014  . Proteinuria   . PTSD (post-traumatic stress disorder)   . Stomach ulcer   . Type I diabetes mellitus ([place])     Past Surgical History:  Procedure Laterality Date  . APPENDECTOMY    . ESOPHAGOGASTRODUODENOSCOPY N/A 01/03/2014   Procedure: ESOPHAGOGASTRODUODENOSCOPY (EGD);  Surgeon: Missy Sabins, MD;  Location[facility] ENDOSCOPY;  Service: Endoscopy;  Laterality: N/A;  . ESOPHAGOGASTRODUODENOSCOPY N/A 08/27/2016   Procedure: EGD with possible interventions;  Surgeon: Mauri Pole, MD;  Location: [facility] ENDOSCOPY;  Service: Endoscopy;  Laterality: N/A;    Social History   Social History  . Marital status: Single    Spouse name: N/A  . Number of children: N/A  . Years of education: N/A   Occupational History  . Not on file.   Social History Main Topics  . Smoking status: Never Smoker  . Smokeless tobacco: Never Used  . Alcohol use No  . Drug use: No  . Sexual activity: Yes   Other Topics Concern  . Not on file   Social History Narrative  . No narrative on file    Current Outpatient Prescriptions on File Prior to Visit  Medication Sig Dispense Refill  . acetaminophen (TYLENOL) 325 MG tablet Take 2 tablets (650 mg total) by mouth every  6 (six) hours as needed for mild pain (or Fever >/= 101). 30 tablet 0  . BD PEN NEEDLE NANO U/F 32G X 4 MM MISC USE AS DIRECTED WITH BASAGLAR  2  . blood glucose meter kit and supplies KIT Dispense per patient insurance preference. Use up to four times daily as directed 1 each 0  . blood glucose meter kit and supplies KIT Dispense based on patient and insurance preference. Use up to four times daily as directed. (FOR ICD-9 250.00, 250.01). 1 each 0  . insulin aspart (NOVOLOG FLEXPEN) 100 UNIT/ML FlexPen Inject 2 Units into the skin 3 (three) times daily with meals. 15 mL 2  . Insulin Glargine (BASAGLAR KWIKPEN) 100 UNIT/ML SOPN Inject 0.15 mLs (15 Units total) into the skin at bedtime. 15 mL 2  . metoCLOPramide (REGLAN) 5 MG tablet Take 1 tablet (5 mg total) by mouth 3 (three) times daily before meals. 90 tablet 0  . ondansetron (ZOFRAN) 4 MG tablet Take 1 tablet (4 mg total) by mouth every 6 (six) hours as needed for nausea. 20 tablet 0  . pantoprazole (PROTONIX) 40 MG tablet Take 40 mg by mouth two times a day 60 tablet 0   No current facility-administered medications on file prior to visit.     Allergies  Allergen Reactions  . Banana Anaphylaxis  . Food Itching and Swelling    Walnuts- THROAT ITCHES AND  EYES SWELL  . Ibuprofen Other (See Comments)    ULCERS  . Nsaids Other (See Comments)    ULCERS     Family History  Problem Relation Age of Onset  . HIV/AIDS Father   . Drug abuse Father   . Drug abuse Sister   . Diabetes Cousin   . Suicidality Neg Hx   . Bipolar disorder Neg Hx   . Depression Neg Hx   . Anxiety disorder Neg Hx     BP 122/84   Pulse 73   Wt 155 lb 9.6 oz (70.6 kg)   SpO2 98%   BMI 25.11 kg/m   Review of Systems Since hosp f/u, he has only had 1 episode of hypoglycemia, and this was mild.      Objective:   Physical Exam VITAL SIGNS:  See vs page GENERAL: no distress Pulses: foot pulses are intact bilaterally.   MSK: no deformity of the feet or  ankles.  CV: no edema of the legs or ankles Skin:  no ulcer on the feet or ankles.  normal color and temp on the feet and ankles Neuro: sensation is intact to touch on the feet and ankles.     Lab Results  Component Value Date   HGBA1C 8.4 (H) 08/27/2016      Assessment & Plan:  Type 1 DM, with nephropathy.  He needs increased rx. Vomiting, new. This complicates the rx of DM.    Patient Instructions  Please continue the lantus: 25 units on nights when you don't work, and 12 units on nights when you work. Please also take 2 units of humalog if you eat a big meal.   On this type of insulin schedule, you should eat meals on a regular schedule.  If a meal is missed or significantly delayed, your blood sugar could go low. check your blood sugar 4 times a day.  vary the time of day when you check, between before the 3 meals, and at bedtime.  also check if you have symptoms of your blood sugar being too high or too low.  please keep a record of the readings and bring it to your next appointment here.  You can write it on any piece of paper.  please call us sooner if your blood sugar goes below 70, or if you have a lot of readings over 200.   please call [phone] [facility] office), to get an appointment with a new primary doctor.   The next step in your diabetes care is to address the stomach problems.  Please come back for a follow-up appointment in 2 months.

## 2016-09-08 ENCOUNTER — Encounter (INDEPENDENT_AMBULATORY_CARE_PROVIDER_SITE_OTHER): Payer: Self-pay

## 2016-09-08 ENCOUNTER — Ambulatory Visit (INDEPENDENT_AMBULATORY_CARE_PROVIDER_SITE_OTHER): Payer: BLUE CROSS/BLUE SHIELD | Admitting: Physician Assistant

## 2016-09-08 ENCOUNTER — Other Ambulatory Visit (INDEPENDENT_AMBULATORY_CARE_PROVIDER_SITE_OTHER): Payer: Self-pay

## 2016-09-08 ENCOUNTER — Encounter: Payer: Self-pay | Admitting: Physician Assistant

## 2016-09-08 VITALS — BP 110/72 | HR 68 | Ht 66.0 in | Wt 152.0 lb

## 2016-09-08 DIAGNOSIS — R109 Unspecified abdominal pain: Secondary | ICD-10-CM | POA: Diagnosis not present

## 2016-09-08 DIAGNOSIS — K920 Hematemesis: Secondary | ICD-10-CM

## 2016-09-08 LAB — CBC WITH DIFFERENTIAL/PLATELET
BASOS PCT: 1 % (ref 0.0–3.0)
Basophils Absolute: 0 10*3/uL (ref 0.0–0.1)
EOS ABS: 0.1 10*3/uL (ref 0.0–0.7)
Eosinophils Relative: 1.8 % (ref 0.0–5.0)
HCT: 41.1 % (ref 39.0–52.0)
Hemoglobin: 13.4 g/dL (ref 13.0–17.0)
LYMPHS ABS: 1.7 10*3/uL (ref 0.7–4.0)
Lymphocytes Relative: 41.6 % (ref 12.0–46.0)
MCHC: 32.5 g/dL (ref 30.0–36.0)
MCV: 84.4 fl (ref 78.0–100.0)
MONO ABS: 0.4 10*3/uL (ref 0.1–1.0)
Monocytes Relative: 10.7 % (ref 3.0–12.0)
NEUTROS ABS: 1.9 10*3/uL (ref 1.4–7.7)
Neutrophils Relative %: 44.9 % (ref 43.0–77.0)
PLATELETS: 154 10*3/uL (ref 150.0–400.0)
RBC: 4.87 Mil/uL (ref 4.22–5.81)
RDW: 13.3 % (ref 11.5–15.5)
WBC: 4.2 10*3/uL (ref 4.0–10.5)

## 2016-09-08 NOTE — Progress Notes (Signed)
Chief Complaint: hospital follow up for hematemesis  HPI:  Tyler [name] is a 31 year old male with a past medical history as below, who presents to clinic today for follow-up after recently being seen in [place] [facility] for hematemesis.   Per chart review [name] was transferred from [facility] to [place] on 08/27/16. At that time he was hemodynamically stable, INR normal, BUN normal and had a previous normal EGD in 2015 for hematemesis. Hemoglobin was 14.3 and drifted down to 12.3 with a normal BUN. Apparently he gave a history of ulcers dating back to Tyler age of 66 in [place]. He typically takes daily Protonix but had run out of month ago. [name]'s EGD was completely normal by Dr. Silverio Decamp. He was advised to avoid NSAIDs.    Today, [place] [name] presented to clinic accompanied by his wife and tells me that he has been doing fairly well since time of discharge. He was quite fatigued for about a week during which he stayed out of work, but he has returned to work now. He reports no further episodes of hematemesis after his initial 3 episodes at home and in [facility] hospital. He has remained on Pantoprazole 40 mg twice a day and is back on his "vitamins", which he thinks helped him with his energy level.   [name] does explain that he was at work yesterday and caught a machine that was about to fall on someone and did strain Tyler left side of his abdomen. He is not using any NSAIDs.    [name] denies fever, chills, melena, weight loss, anorexia, nausea, vomiting, heartburn or reflux.  Past Medical History:  Diagnosis Date  . Anxiety   . Depression   . Diabetic peripheral neuropathy ([place])   . Hematemesis/vomiting blood admissions 09/2013, 01/02/2014  . Proteinuria   . PTSD (post-traumatic stress disorder)   . Stomach ulcer   . Type I diabetes mellitus ([place])     Past Surgical History:  Procedure Laterality Date  . APPENDECTOMY    . ESOPHAGOGASTRODUODENOSCOPY N/A 01/03/2014   Procedure:  ESOPHAGOGASTRODUODENOSCOPY (EGD);  Surgeon: Missy Sabins, MD;  Location: [facility] ENDOSCOPY;  Service: Endoscopy;  Laterality: N/A;  . ESOPHAGOGASTRODUODENOSCOPY N/A 08/27/2016   Procedure: EGD with possible interventions;  Surgeon: Mauri Pole, MD;  Location: [place] ENDOSCOPY;  Service: Endoscopy;  Laterality: N/A;    Current Outpatient Prescriptions  Medication Sig Dispense Refill  . acetaminophen (TYLENOL) 325 MG tablet Take 2 tablets (650 mg total) by mouth every 6 (six) hours as needed for mild pain (or Fever >/= 101). 30 tablet 0  . BD PEN NEEDLE NANO U/F 32G X 4 MM MISC USE AS DIRECTED WITH BASAGLAR  2  . blood glucose meter kit and supplies KIT Dispense per [name] insurance preference. Use up to four times daily as directed 1 each 0  . blood glucose meter kit and supplies KIT Dispense based on [name] and insurance preference. Use up to four times daily as directed. (FOR ICD-9 250.00, 250.01). 1 each 0  . insulin aspart (NOVOLOG FLEXPEN) 100 UNIT/ML FlexPen Inject 2 Units into Tyler skin 3 (three) times daily with meals. 15 mL 2  . Insulin Glargine (BASAGLAR KWIKPEN) 100 UNIT/ML SOPN Inject 0.15 mLs (15 Units total) into Tyler skin at bedtime. 15 mL 2  . metoCLOPramide (REGLAN) 5 MG tablet Take 1 tablet (5 mg total) by mouth 3 (three) times daily before meals. 90 tablet 0  . ondansetron (ZOFRAN) 4 MG tablet Take 1 tablet (4 mg total)  by mouth every 6 (six) hours as needed for nausea. 20 tablet 0  . pantoprazole (PROTONIX) 40 MG tablet Take 40 mg by mouth two times a day 60 tablet 0   No current facility-administered medications for this visit.     Allergies as of 09/08/2016 - Review Complete 09/05/2016  Allergen Reaction Noted  . 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 Food Itching and Swelling 09/05/2015  . Ibuprofen Other (See Comments) 09/28/2011  . Nsaids Other (See Comments) 08/27/2016    Family History  Problem Relation Age of Onset  . HIV/AIDS Father   . Drug abuse Father   . Drug  abuse Sister   . Diabetes Cousin   . Suicidality Neg Hx   . Bipolar disorder Neg Hx   . Depression Neg Hx   . Anxiety disorder Neg Hx     Social History   Social History  . Marital status: Single    Spouse name: N/A  . Number of children: N/A  . Years of education: N/A   Occupational History  . Not on file.   Social History Main Topics  . Smoking status: Never Smoker  . Smokeless tobacco: Never Used  . Alcohol use No  . Drug use: No  . Sexual activity: Yes   Other Topics Concern  . Not on file   Social History Narrative  . No narrative on file    Review of Systems:    Constitutional: No weight loss, fever or chills Cardiovascular: No chest pain Respiratory: No SOB Gastrointestinal: See HPI and otherwise negative   Physical Exam:  Vital signs: BP 110/72   Pulse 68   Ht 5' 6"  (1.676 m)   Wt 152 lb (68.9 kg)   BMI 24.53 kg/m    Constitutional:   Pleasant African American male appears to be in NAD, Well developed, Well nourished, alert and cooperative Respiratory: Respirations even and unlabored. Lungs clear to auscultation bilaterally.   No wheezes, crackles, or rhonchi.  Cardiovascular: Normal S1, S2. No MRG. Regular rate and rhythm. No peripheral edema, cyanosis or pallor.  Gastrointestinal:  Soft, nondistended, Markedly tender on left side of abdomen, very superficially, No rebound or guarding. Normal bowel sounds. No appreciable masses or hepatomegaly. Psychiatric:  Demonstrates good judgement and reason without abnormal affect or behaviors.  MOST RECENT LABS AND IMAGING: CBC    Component Value Date/Time   WBC 4.5 08/29/2016 0633   RBC 4.55 08/29/2016 0633   HGB 12.0 (L) 08/29/2016 0633   HCT 37.2 (L) 08/29/2016 0633   PLT 126 (L) 08/29/2016 0633   MCV 81.8 08/29/2016 0633   MCH 26.4 08/29/2016 0633   MCHC 32.3 08/29/2016 0633   RDW 12.7 08/29/2016 0633   LYMPHSABS 1.9 08/29/2016 0633   MONOABS 0.6 08/29/2016 0633   EOSABS 0.1 08/29/2016 0633    BASOSABS 0.0 08/29/2016 0633    CMP     Component Value Date/Time   NA 139 08/29/2016 0633   K 3.6 08/29/2016 0633   CL 108 08/29/2016 0633   CO2 26 08/29/2016 0633   GLUCOSE 103 (H) 08/29/2016 0633   BUN 11 08/29/2016 0633   CREATININE 1.18 08/29/2016 0633   CREATININE 1.07 11/20/2013 1236   CALCIUM 8.5 (L) 08/29/2016 0633   PROT 6.3 (L) 08/29/2016 0633   ALBUMIN 3.4 (L) 08/29/2016 0633   AST 18 08/29/2016 0633   ALT 12 (L) 08/29/2016 0633   ALKPHOS 52 08/29/2016 0633   BILITOT 0.7 08/29/2016 0633   GFRNONAA >60 08/29/2016 5374  GFRAA >60 08/29/2016 3202    Assessment: 1. Hematemesis: No further episodes since time of hospitalization, [name] feeling well with Pantoprazole 40 twice a day 2. Abdominal pain: Related to muscle strain after an incident at work yesterday where he caught a heavy machine  Plan: 1. Ordered repeat CBC 2. Recommend [place] [name] stay on Protonix 40 mg twice a day for a total of 1-2 months and then decrease down to once daily 3. Would recommend [place] [name] continue his regular vitamins as he feels this helps with his energy 4. Provided [name] with number to send his FMLA paperwork 5. Recommend [place] [name] use heating pad on Tyler left side of his abdomen/[place] 6. [name] to follow in clinic with Dr. Silverio Decamp as needed in Tyler future  Ellouise Newer, PA-C [facility] 09/08/2016, 9:12 AM

## 2016-09-08 NOTE — Patient Instructions (Signed)
Continue Protonix 40 mg twice a day for 1-2 months then start daily.   Your physician has requested that you go to the basement for the following lab work before leaving today: CBC  This is the number for FMLA paperwork:  [phone] Contact person is Joni Reiningicole

## 2016-09-09 NOTE — Progress Notes (Signed)
Reviewed and agree with documentation and assessment and plan. K. Veena Shirely Toren , MD   

## 2016-11-04 ENCOUNTER — Ambulatory Visit: Payer: Self-pay | Admitting: Endocrinology

## 2017-02-15 ENCOUNTER — Encounter: Payer: Self-pay | Admitting: Endocrinology

## 2017-02-15 ENCOUNTER — Ambulatory Visit (INDEPENDENT_AMBULATORY_CARE_PROVIDER_SITE_OTHER): Payer: BLUE CROSS/BLUE SHIELD | Admitting: Endocrinology

## 2017-02-15 VITALS — BP 100/72 | HR 82 | Wt 153.8 lb

## 2017-02-15 DIAGNOSIS — E109 Type 1 diabetes mellitus without complications: Secondary | ICD-10-CM

## 2017-02-15 DIAGNOSIS — Z23 Encounter for immunization: Secondary | ICD-10-CM | POA: Diagnosis not present

## 2017-02-15 DIAGNOSIS — E1021 Type 1 diabetes mellitus with diabetic nephropathy: Secondary | ICD-10-CM

## 2017-02-15 LAB — POCT GLYCOSYLATED HEMOGLOBIN (HGB A1C): HEMOGLOBIN A1C: 7.8

## 2017-02-15 MED ORDER — BASAGLAR KWIKPEN 100 UNIT/ML ~~LOC~~ SOPN: 15.0000 [IU] | PEN_INJECTOR | Freq: Every day | SUBCUTANEOUS | 11 refills | Status: DC

## 2017-02-15 MED ORDER — METOCLOPRAMIDE HCL 5 MG PO TABS: 5.0000 mg | ORAL_TABLET | Freq: Three times a day (TID) | ORAL | 0 refills | Status: AC

## 2017-02-15 MED ORDER — INSULIN ASPART 100 UNIT/ML FLEXPEN: 2.0000 [IU] | PEN_INJECTOR | Freq: Three times a day (TID) | SUBCUTANEOUS | 2 refills | Status: DC

## 2017-02-15 NOTE — Progress Notes (Signed)
Subjective:    Patient ID: Tyler [name], male    DOB: [birth year], 32 y.o.   MRN: [id number]  HPI Pt returns for f/u of diabetes mellitus:  DM type: 1 Dx'ed: 2010 Complications: nephropathy.  Therapy: insulin since soon after dx.  DKA: never Severe hypoglycemia: never.  Pancreatitis: never.  Other: he takes multiple daily injections, but emphasizes basal insulin; he works 2nd shift, [employer]; he declines pump rx for now.   Interval history: nausea is well-controlled.  He takes 15 units of  basaglar every night.  no cbg record, but states cbg's are well-controlled Past Medical History:  Diagnosis Date  . Anxiety   . Depression   . Diabetic peripheral neuropathy ([facility])   . Hematemesis/vomiting blood admissions 09/2013, 01/02/2014  . Proteinuria   . PTSD (post-traumatic stress disorder)   . Stomach ulcer   . Type I diabetes mellitus ([facility])     Past Surgical History:  Procedure Laterality Date  . APPENDECTOMY    . ESOPHAGOGASTRODUODENOSCOPY N/A 01/03/2014   Procedure: ESOPHAGOGASTRODUODENOSCOPY (EGD);  Surgeon: Barrie FolkJohn C Hayes, MD;  [facility];  Service: Endoscopy;  Laterality: N/A;  . ESOPHAGOGASTRODUODENOSCOPY N/A 08/27/2016   Procedure: EGD with possible interventions;  Surgeon: Napoleon FormNandigam, Kavitha V, MD;  Location: [facility] ENDOSCOPY;  Service: Endoscopy;  Laterality: N/A;    Social History   Socioeconomic History  . Marital status: Single    Spouse name: Not on file  . Number of children: 1  . Years of education: Not on file  . Highest education level: Not on file  Social Needs  . Financial resource strain: Not on file  . Food insecurity - worry: Not on file  . Food insecurity - inability: Not on file  . Transportation needs - medical: Not on file  . Transportation needs - non-medical: Not on file  Occupational History  . Not on file  Tobacco Use  . Smoking status: Never Smoker  . Smokeless tobacco: Never Used  Substance and Sexual Activity  . Alcohol use: No    . Drug use: No  . Sexual activity: Yes  Other Topics Concern  . Not on file  Social History Narrative  . Not on file    Current Outpatient Medications on File Prior to Visit  Medication Sig Dispense Refill  . acetaminophen (TYLENOL) 325 MG tablet Take 2 tablets (650 mg total) by mouth every 6 (six) hours as needed for mild pain (or Fever >/= 101). 30 tablet 0  . BD PEN NEEDLE NANO U/F 32G X 4 MM MISC USE AS DIRECTED WITH BASAGLAR  2  . pantoprazole (PROTONIX) 40 MG tablet Take 40 mg by mouth two times a day 60 tablet 0   No current facility-administered medications on file prior to visit.     Allergies  Allergen Reactions  . Banana Anaphylaxis  . Food Itching and Swelling    Walnuts- THROAT ITCHES AND EYES SWELL  . Ibuprofen Other (See Comments)    ULCERS  . Nsaids Other (See Comments)    ULCERS     Family History  Problem Relation Age of Onset  . HIV/AIDS Father   . Drug abuse Father   . Drug abuse Sister   . Diabetes Cousin   . Suicidality Neg Hx   . Bipolar disorder Neg Hx   . Depression Neg Hx   . Anxiety disorder Neg Hx     BP 100/72 (BP Location: Left Arm, Patient Position: Sitting, Cuff Size: Normal)   Pulse 82  Wt 153 lb 12.8 oz (69.8 kg)   SpO2 98%   BMI 24.82 kg/m    Review of Systems He denies hypoglycemia    Objective:   Physical Exam VITAL SIGNS:  See vs page GENERAL: no distress Pulses: foot pulses are intact bilaterally.   MSK: no deformity of the feet or ankles.  CV: no edema of the legs or ankles Skin:  no ulcer on the feet or ankles.  normal color and temp on the feet and ankles Neuro: sensation is intact to touch on the feet and ankles.    Lab Results  Component Value Date   HGBA1C 7.8 02/15/2017      Assessment & Plan:  Insulin-requiring type 2 DM: this is the best control this pt should aim for, given this regimen, which does match insulin to her changing needs throughout the day  Patient Instructions  Please continue the  same insulins.   On this type of insulin schedule, you should eat meals on a regular schedule.  If a meal is missed or significantly delayed, your blood sugar could go low. check your blood sugar 4 times a day.  vary the time of day when you check, between before the 3 meals, and at bedtime.  also check if you have symptoms of your blood sugar being too high or too low.  please keep a record of the readings and bring it to your next appointment here.  You can write it on any piece of paper.  please call us sooner if your blood sugar goes below 70, or if you have a lot of readings over 200.   please call [phone] [facility] office), to get an appointment with a new primary doctor.  I have sent a prescription to your pharmacy, to refill the metoclopramide, until you get to this appointment.   Please come back for a follow-up appointment in 4 months.

## 2017-02-15 NOTE — Patient Instructions (Addendum)
Please continue the same insulins.   On this type of insulin schedule, you should eat meals on a regular schedule.  If a meal is missed or significantly delayed, your blood sugar could go low. check your blood sugar 4 times a day.  vary the time of day when you check, between before the 3 meals, and at bedtime.  also check if you have symptoms of your blood sugar being too high or too low.  please keep a record of the readings and bring it to your next appointment here.  You can write it on any piece of paper.  please call us sooner if your blood sugar goes below 70, or if you have a lot of readings over 200.   please call [phone] Shon Hough([place] office), to get an appointment with a new primary doctor.  I have sent a prescription to your pharmacy, to refill the metoclopramide, until you get to this appointment.   Please come back for a follow-up appointment in 4 months.

## 2017-02-23 ENCOUNTER — Ambulatory Visit (INDEPENDENT_AMBULATORY_CARE_PROVIDER_SITE_OTHER): Payer: BLUE CROSS/BLUE SHIELD | Admitting: Family Medicine

## 2017-02-23 ENCOUNTER — Encounter: Payer: Self-pay | Admitting: Family Medicine

## 2017-02-23 VITALS — BP 100/70 | HR 91 | Ht 66.0 in | Wt 154.4 lb

## 2017-02-23 DIAGNOSIS — E109 Type 1 diabetes mellitus without complications: Secondary | ICD-10-CM | POA: Diagnosis not present

## 2017-02-23 DIAGNOSIS — F339 Major depressive disorder, recurrent, unspecified: Secondary | ICD-10-CM

## 2017-02-23 DIAGNOSIS — Z0000 Encounter for general adult medical examination without abnormal findings: Secondary | ICD-10-CM | POA: Diagnosis not present

## 2017-02-23 LAB — URINALYSIS, ROUTINE W REFLEX MICROSCOPIC
Bilirubin Urine: NEGATIVE
Hgb urine dipstick: NEGATIVE
Ketones, ur: NEGATIVE
Leukocytes, UA: NEGATIVE
Nitrite: NEGATIVE
PH: 6 (ref 5.0–8.0)
RBC / HPF: NONE SEEN (ref 0–?)
Specific Gravity, Urine: 1.025 (ref 1.000–1.030)
TOTAL PROTEIN, URINE-UPE24: NEGATIVE
Urine Glucose: >=1000 — AB
Urobilinogen, UA: 0.2 (ref 0.0–1.0)

## 2017-02-23 LAB — COMPREHENSIVE METABOLIC PANEL
ALT: 11 U/L (ref 0–53)
AST: 11 U/L (ref 0–37)
Albumin: 4.3 g/dL (ref 3.5–5.2)
Alkaline Phosphatase: 72 U/L (ref 39–117)
BUN: 12 mg/dL (ref 6–23)
CHLORIDE: 100 meq/L (ref 96–112)
CO2: 33 meq/L — AB (ref 19–32)
Calcium: 9.6 mg/dL (ref 8.4–10.5)
Creatinine, Ser: 1.18 mg/dL (ref 0.40–1.50)
GFR: 91.1 mL/min (ref >60.00)
GLUCOSE: 270 mg/dL — AB (ref 70–99)
POTASSIUM: 4 meq/L (ref 3.5–5.1)
Sodium: 138 mEq/L (ref 135–145)
TOTAL PROTEIN: 7.7 g/dL (ref 6.0–8.3)
Total Bilirubin: 0.5 mg/dL (ref 0.2–1.2)

## 2017-02-23 LAB — LIPID PANEL
CHOL/HDL RATIO: 4
Cholesterol: 170 mg/dL (ref 0–200)
HDL: 40 mg/dL (ref >39.00)
LDL CALC: 110 mg/dL — AB (ref 0–99)
NONHDL: 130.42
Triglycerides: 102 mg/dL (ref 0.0–149.0)
VLDL: 20.4 mg/dL (ref 0.0–40.0)

## 2017-02-23 LAB — CBC
HEMATOCRIT: 43 % (ref 39.0–52.0)
Hemoglobin: 14.4 g/dL (ref 13.0–17.0)
MCHC: 33.5 g/dL (ref 30.0–36.0)
MCV: 82.9 fl (ref 78.0–100.0)
Platelets: 138 10*3/uL — ABNORMAL LOW (ref 150.0–400.0)
RBC: 5.19 Mil/uL (ref 4.22–5.81)
RDW: 13 % (ref 11.5–15.5)
WBC: 4.1 10*3/uL (ref 4.0–10.5)

## 2017-02-23 LAB — TSH: TSH: 2.6 u[IU]/mL (ref 0.35–4.50)

## 2017-02-23 LAB — MICROALBUMIN / CREATININE URINE RATIO
Creatinine,U: 203.6 mg/dL
MICROALB/CREAT RATIO: 0.6 mg/g (ref 0.0–30.0)
Microalb, Ur: 1.2 mg/dL (ref 0.0–1.9)

## 2017-02-23 MED ORDER — SERTRALINE HCL 50 MG PO TABS: ORAL_TABLET | ORAL | 0 refills | Status: DC
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
## 2017-02-23 NOTE — Patient Instructions (Addendum)
Major Depressive Disorder, Adult Major depressive disorder (MDD) is a mental health condition. It may also be called clinical depression or unipolar depression. MDD usually causes feelings of sadness, hopelessness, or helplessness. MDD can also cause physical symptoms. It can interfere with work, school, relationships, and other everyday activities. MDD may be mild, moderate, or severe. It may occur once (single episode major depressive disorder) or it may occur multiple times (recurrent major depressive disorder). What are the causes? The exact cause of this condition is not known. MDD is most likely caused by a combination of things, which may include:  Genetic factors. These are traits that are passed along from parent to child.  Individual factors. Your personality, your behavior, and the way you handle your thoughts and feelings may contribute to MDD. This includes personality traits and behaviors learned from others.  Physical factors, such as: ? Differences in the part of your brain that controls emotion. This part of your brain may be different than it is in people who do not have MDD. ? Long-term (chronic) medical or psychiatric illnesses.  Social factors. Traumatic experiences or major life changes may play a role in the development of MDD.  What increases the risk? This condition is more likely to develop in women. The following factors may also make you more likely to develop MDD:  A family history of depression.  Troubled family relationships.  Abnormally low levels of certain brain chemicals.  Traumatic events in childhood, especially abuse or the loss of a parent.  Being under a lot of stress, or long-term stress, especially from upsetting life experiences or losses.  A history of: ? Chronic physical illness. ? Other mental health disorders. ? Substance abuse.  Poor living conditions.  Experiencing social exclusion or discrimination on a regular basis.  What are  the signs or symptoms? The main symptoms of MDD typically include:  Constant depressed or irritable mood.  Loss of interest in things and activities.  MDD symptoms may also include:  Sleeping or eating too much or too little.  Unexplained weight change.  Fatigue or low energy.  Feelings of worthlessness or guilt.  Difficulty thinking clearly or making decisions.  Thoughts of suicide or of harming others.  Physical agitation or weakness.  Isolation.  Severe cases of MDD may also occur with other symptoms, such as:  Delusions or hallucinations, in which you imagine things that are not real (psychotic depression).  Low-level depression that lasts at least a year (chronic depression or persistent depressive disorder).  Extreme sadness and hopelessness (melancholic depression).  Trouble speaking and moving (catatonic depression).  How is this diagnosed? This condition may be diagnosed based on:  Your symptoms.  Your medical history, including your mental health history. This may involve tests to evaluate your mental health. You may be asked questions about your lifestyle, including any drug and alcohol use, and how long you have had symptoms of MDD.  A physical exam.  Blood tests to rule out other conditions.  You must have a depressed mood and at least four other MDD symptoms most of the day, nearly every day in the same 2-week timeframe before your health care provider can confirm a diagnosis of MDD. How is this treated? This condition is usually treated by mental health professionals, such as psychologists, psychiatrists, and clinical social workers. You may need more than one type of treatment. Treatment may include:  Psychotherapy. This is also called talk therapy or counseling. Types of psychotherapy include: ? Cognitive behavioral   therapy (CBT). This type of therapy teaches you to recognize unhealthy feelings, thoughts, and behaviors, and replace them with  positive thoughts and actions. ? Interpersonal therapy (IPT). This helps you to improve the way you relate to and communicate with others. ? Family therapy. This treatment includes members of your family.  Medicine to treat anxiety and depression, or to help you control certain emotions and behaviors.  Lifestyle changes, such as: ? Limiting alcohol and drug use. ? Exercising regularly. ? Getting plenty of sleep. ? Making healthy eating choices. ? Spending more time outdoors.  Treatments involving stimulation of the brain can be used in situations with extremely severe symptoms, or when medicine or other therapies do not work over time. These treatments include electroconvulsive therapy, transcranial magnetic stimulation, and vagal nerve stimulation. Follow these instructions at home: Activity  Return to your normal activities as told by your health care provider.  Exercise regularly and spend time outdoors as told by your health care provider. General instructions  Take over-the-counter and prescription medicines only as told by your health care provider.  Do not drink alcohol. If you drink alcohol, limit your alcohol intake to no more than 1 drink a day for nonpregnant women and 2 drinks a day for men. One drink equals 12 oz of beer, 5 oz of wine, or 1 oz of hard liquor. Alcohol can affect any antidepressant medicines you are taking. Talk to your health care provider about your alcohol use.  Eat a healthy diet and get plenty of sleep.  Find activities that you enjoy doing, and make time to do them.  Consider joining a support group. Your health care provider may be able to recommend a support group.  Keep all follow-up visits as told by your health care provider. This is important. Where to find more information: [employer] Mental [employer]ness  www.nami.org  [employer] of Mental [employer]  http://www.maynard.net/www.nimh.nih.gov  National Suicide Prevention  Lifeline  1-800-273-TALK 610-014-4488(8255). This is free, 24-hour help.  Contact a health care provider if:  Your symptoms get worse.  You develop new symptoms. Get help right away if:  You self-harm.  You have serious thoughts about hurting yourself or others.  You see, hear, taste, smell, or feel things that are not present (hallucinate). This information is not intended to replace advice given to you by your health care provider. Make sure you discuss any questions you have with your health care provider. Document Released: 05/07/2012 Document Revised: 09/17/2015 Document Reviewed: 07/22/2015 Elsevier Interactive Patient Education  [employer]2018 Elsevier Inc. PHQ test

## 2017-02-24 LAB — HIV ANTIBODY (ROUTINE TESTING W REFLEX): HIV 1&2 Ab, 4th Generation: NONREACTIVE

## 2017-03-01 ENCOUNTER — Encounter: Payer: Self-pay | Admitting: Family Medicine

## 2017-03-01 ENCOUNTER — Ambulatory Visit: Payer: BLUE CROSS/BLUE SHIELD | Admitting: Family Medicine

## 2017-03-01 VITALS — BP 122/80 | HR 74 | Ht 66.0 in | Wt 153.4 lb

## 2017-03-01 DIAGNOSIS — E109 Type 1 diabetes mellitus without complications: Secondary | ICD-10-CM

## 2017-03-01 DIAGNOSIS — F331 Major depressive disorder, recurrent, moderate: Secondary | ICD-10-CM

## 2017-03-01 NOTE — Progress Notes (Signed)
Subjective:  Patient ID: Tyler [name], male    DOB: Sep 12, 1985  Age: 32 y.o. MRN: [id number]  CC: Follow-up (on diabetes) and Release to Work   HPI Tyler [name] presents for a return to work.  Fortunately he says that he is starting to feel some effect from restarting the Zoloft.  He is taking it in the evening and it does seem to be helping him sleep better.  He is quit 1 of his jobs and this is relieving of stress.  He is currently looking for another place to live where he and his wife can have their own space.  Outpatient Medications Prior to Visit  Medication Sig Dispense Refill  . acetaminophen (TYLENOL) 325 MG tablet Take 2 tablets (650 mg total) by mouth every 6 (six) hours as needed for mild pain (or Fever >/= 101). 30 tablet 0  . BD PEN NEEDLE NANO U/F 32G X 4 MM MISC USE AS DIRECTED WITH BASAGLAR  2  . insulin aspart (NOVOLOG FLEXPEN) 100 UNIT/ML FlexPen Inject 2 Units into the skin 3 (three) times daily with meals. 15 mL 2  . Insulin Glargine (BASAGLAR KWIKPEN) 100 UNIT/ML SOPN Inject 0.15 mLs (15 Units total) into the skin at bedtime. 15 mL 11  . metoCLOPramide (REGLAN) 5 MG tablet Take 1 tablet (5 mg total) by mouth 3 (three) times daily before meals. 90 tablet 0  . pantoprazole (PROTONIX) 40 MG tablet Take 40 mg by mouth two times a day 60 tablet 0  . sertraline (ZOLOFT) 50 MG tablet Take one tablet daily for one week and then increase to two tablets daily. 60 tablet 0   No facility-administered medications prior to visit.     ROS Review of Systems  Constitutional: Negative.   Psychiatric/Behavioral: Positive for dysphoric mood. Negative for behavioral problems and decreased concentration.    Objective:  BP 122/80 (BP Location: Left Arm, Patient Position: Sitting, Cuff Size: Normal)   Pulse 74   Ht 5\' 6"  (1.676 m)   Wt 153 lb 6 oz (69.6 kg)   SpO2 99%   BMI 24.76 kg/m   BP Readings from Last 3 Encounters:  03/01/17 122/80  02/23/17 100/70  02/15/17 100/72     Wt Readings from Last 3 Encounters:  03/01/17 153 lb 6 oz (69.6 kg)  02/23/17 154 lb 6 oz (70 kg)  02/15/17 153 lb 12.8 oz (69.8 kg)    Physical Exam  Constitutional: He is oriented to person, place, and time. He appears well-developed and well-nourished. No distress.  HENT:  Head: Normocephalic and atraumatic.  Right Ear: External ear normal.  Left Ear: External ear normal.  Eyes: Right eye exhibits no discharge. Left eye exhibits no discharge. No scleral icterus.  Pulmonary/Chest: Effort normal.  Neurological: He is alert and oriented to person, place, and time.  Skin: Skin is warm and dry. He is not diaphoretic.  Psychiatric: He has a normal mood and affect. His behavior is normal.    Lab Results  Component Value Date   WBC 4.1 02/23/2017   HGB 14.4 02/23/2017   HCT 43.0 02/23/2017   PLT 138.0 (L) 02/23/2017   GLUCOSE 270 (H) 02/23/2017   CHOL 170 02/23/2017   TRIG 102.0 02/23/2017   HDL 40.00 02/23/2017   LDLCALC 110 (H) 02/23/2017   ALT 11 02/23/2017   AST 11 02/23/2017   NA 138 02/23/2017   K 4.0 02/23/2017   CL 100 02/23/2017   CREATININE 1.18 02/23/2017   BUN 12 02/23/2017  CO2 33 (H) 02/23/2017   TSH 2.60 02/23/2017   INR 1.10 08/27/2016   HGBA1C 7.8 02/15/2017   MICROALBUR 1.2 02/23/2017    No results found.  Assessment & Plan:   Tyler [name] was seen today for follow-up and release to work.  Diagnoses and all orders for this visit:  Insulin dependent type 1 diabetes mellitus (HCC)  Major depressive disorder, recurrent episode, moderate (HCC)   I am having Tyler [name] maintain his BD PEN NEEDLE NANO U/F, acetaminophen, pantoprazole, metoCLOPramide, BASAGLAR KWIKPEN, insulin aspart, and sertraline.  No orders of the defined types were placed in this encounter.  Strongly encourage patient to follow-up for some counseling and he assured me he will do that.  I reminded only he can control his his blood sugars and he agreed.  He has an appointment  with me on the 12th he will check his blood sugar at that time and follow-up of his depression and anxiety.  Return to work was given today.  Follow-up: No Follow-up on file.  Mliss SaxWilliam Alfred Kremer, MD

## 2017-03-07 ENCOUNTER — Ambulatory Visit: Payer: Self-pay | Admitting: Family Medicine

## 2017-03-14 ENCOUNTER — Other Ambulatory Visit: Payer: Self-pay

## 2017-03-14 ENCOUNTER — Telehealth: Payer: Self-pay | Admitting: Endocrinology

## 2017-03-14 MED ORDER — BD PEN NEEDLE NANO U/F 32G X 4 MM MISC: 2 refills | Status: DC

## 2017-03-14 NOTE — Telephone Encounter (Signed)
Patient need refill for BD PEN NEEDLE NANO U/F 32G X 4 MM MISC [[id number]]  Send to,  [id number] - [place], [place] - [address] [facility] [address] [facility] [address] DEA #:  [id number]

## 2017-03-14 NOTE — Telephone Encounter (Signed)
I have sent to pharmacy.

## 2017-03-23 ENCOUNTER — Ambulatory Visit: Payer: Self-pay | Admitting: Family Medicine

## 2017-03-27 ENCOUNTER — Ambulatory Visit: Payer: Self-pay | Admitting: Family Medicine

## 2017-06-15 ENCOUNTER — Ambulatory Visit: Payer: Self-pay | Admitting: Endocrinology

## 2017-06-15 DIAGNOSIS — Z0289 Encounter for other administrative examinations: Secondary | ICD-10-CM

## 2017-07-09 ENCOUNTER — Emergency Department (HOSPITAL_COMMUNITY)
Admission: EM | Admit: 2017-07-09 | Discharge: 2017-07-10 | Disposition: A | Discharge status: Home / Self Care | Payer: BLUE CROSS/BLUE SHIELD | Attending: Emergency Medicine | Admitting: Emergency Medicine

## 2017-07-09 ENCOUNTER — Emergency Department (HOSPITAL_COMMUNITY): Payer: BLUE CROSS/BLUE SHIELD

## 2017-07-09 ENCOUNTER — Encounter (HOSPITAL_COMMUNITY): Payer: Self-pay | Admitting: Emergency Medicine

## 2017-07-09 DIAGNOSIS — Z794 Long term (current) use of insulin: Secondary | ICD-10-CM | POA: Diagnosis not present

## 2017-07-09 DIAGNOSIS — E1044 Type 1 diabetes mellitus with diabetic amyotrophy: Secondary | ICD-10-CM | POA: Diagnosis not present

## 2017-07-09 DIAGNOSIS — R6884 Jaw pain: Secondary | ICD-10-CM | POA: Insufficient documentation

## 2017-07-09 DIAGNOSIS — Z79899 Other long term (current) drug therapy: Secondary | ICD-10-CM | POA: Diagnosis not present

## 2017-07-09 DIAGNOSIS — M7918 Myalgia, other site: Secondary | ICD-10-CM

## 2017-07-09 DIAGNOSIS — M542 Cervicalgia: Secondary | ICD-10-CM

## 2017-07-09 DIAGNOSIS — M791 Myalgia, unspecified site: Secondary | ICD-10-CM | POA: Insufficient documentation

## 2017-07-09 DIAGNOSIS — T1490XA Injury, unspecified, initial encounter: Secondary | ICD-10-CM

## 2017-07-09 LAB — I-STAT CHEM 8, ED
BUN: 17 mg/dL (ref 6–20)
Calcium, Ion: 1.24 mmol/L (ref 1.15–1.40)
Chloride: 105 mmol/L (ref 101–111)
Creatinine, Ser: 1.3 mg/dL — ABNORMAL HIGH (ref 0.61–1.24)
Glucose, Bld: 138 mg/dL — ABNORMAL HIGH (ref 65–99)
HCT: 40 % (ref 39.0–52.0)
Hemoglobin: 13.6 g/dL (ref 13.0–17.0)
Potassium: 3.7 mmol/L (ref 3.5–5.1)
Sodium: 143 mmol/L (ref 135–145)
TCO2: 24 mmol/L (ref 22–32)

## 2017-07-09 MED ORDER — MORPHINE SULFATE (PF) 4 MG/ML IV SOLN
4.0000 mg | Freq: Once | INTRAVENOUS | Status: AC
  Administered 2017-07-09: 4 mg via INTRAVENOUS
  Filled 2017-07-09: qty 1

## 2017-07-09 MED ORDER — METOCLOPRAMIDE HCL 5 MG/ML IJ SOLN
10.0000 mg | Freq: Once | INTRAMUSCULAR | Status: AC
  Administered 2017-07-09: 10 mg via INTRAVENOUS
  Filled 2017-07-09: qty 2

## 2017-07-09 NOTE — ED Notes (Signed)
Patient transported to X-ray 
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
## 2017-07-09 NOTE — ED Notes (Signed)
Per wife (pt not in room at this time), no pre-MRI meds needed.

## 2017-07-09 NOTE — ED Provider Notes (Signed)
[facility] EMERGENCY DEPARTMENT Provider Note   CSN: [id number] Arrival date & time: 07/09/17  2101     History   Chief Complaint Chief Complaint  Patient presents with  . Neck Pain    HPI Oryon Gary is a 32 y.o. male with history of type 1 diabetes, neuropathy, proteinuria, mild renal insufficiency who presents with neck injury that occurred while at a wrestling match.  Patient reports he was pinned with his right side of his neck to the mat.  He reports he could not breathe for couple seconds until he was released and began seeing stars.  He thinks he passed out for a second.  He has had a headache and photophobia in his right eye since.  He has poor vision at baseline related to his type 1 diabetes.  He has had associated right jaw pain and pain when he swallows since the incident.  No medication taken prior to arrival.  Patient denies any chest pain, shortness of breath, abdominal pain.  Patient initially had some nausea, which is resolved.  HPI  Past Medical History:  Diagnosis Date  . Anxiety   . Depression   . Diabetic peripheral neuropathy (HCC)   . Hematemesis/vomiting blood admissions 09/2013, 01/02/2014  . Proteinuria   . PTSD (post-traumatic stress disorder)   . Stomach ulcer   . Type I diabetes mellitus [facility])     Patient Active Problem List   Diagnosis Date Noted  . Hematemesis with nausea 08/27/2016  . Renal insufficiency, mild 08/27/2016  . Insulin dependent type 1 diabetes mellitus (HCC) 08/27/2016  . Generalized abdominal pain   . Upper GI bleed   . Intermittent explosive disorder 11/21/2013  . GAD (generalized anxiety disorder) 11/21/2013  . Major depressive disorder, recurrent episode, moderate (HCC) 11/21/2013  . PTSD (post-traumatic stress disorder) 11/21/2013    Past Surgical History:  Procedure Laterality Date  . APPENDECTOMY    . ESOPHAGOGASTRODUODENOSCOPY N/A 01/03/2014   Procedure: ESOPHAGOGASTRODUODENOSCOPY (EGD);   Surgeon: Barrie Folk, MD;  Location: [facility] ENDOSCOPY;  Service: Endoscopy;  Laterality: N/A;  . ESOPHAGOGASTRODUODENOSCOPY N/A 08/27/2016   Procedure: EGD with possible interventions;  Surgeon: Napoleon Form, MD;  Location: [facility] ENDOSCOPY;  Service: Endoscopy;  Laterality: N/A;        Home Medications    Prior to Admission medications   Medication Sig Start Date End Date Taking? Authorizing Provider  insulin aspart (NOVOLOG FLEXPEN) 100 UNIT/ML FlexPen Inject 2 Units into the skin 3 (three) times daily with meals. 02/15/17  Yes Romero Belling, MD  Insulin Glargine (BASAGLAR KWIKPEN) 100 UNIT/ML SOPN Inject 0.15 mLs (15 Units total) into the skin at bedtime. 02/15/17  Yes Romero Belling, MD  pantoprazole (PROTONIX) 40 MG tablet Take 40 mg by mouth two times a day 08/29/16  Yes Sheikh, Omair Latif, DO  sertraline (ZOLOFT) 50 MG tablet Take one tablet daily for one week and then increase to two tablets daily. Patient taking differently: Take 100 mg by mouth daily.  02/23/17  Yes Mliss Sax, MD  acetaminophen (TYLENOL) 325 MG tablet Take 2 tablets (650 mg total) by mouth every 6 (six) hours as needed for mild pain (or Fever >/= 101). Patient not taking: Reported on 07/09/2017 08/28/16   Merlene Laughter, DO  BD PEN NEEDLE NANO U/F 32G X 4 MM MISC USE AS DIRECTED WITH Mariella Saa 03/14/17   Romero Belling, MD  metoCLOPramide (REGLAN) 5 MG tablet Take 1 tablet (5 mg total) by mouth 3 (three)  times daily before meals. Patient not taking: Reported on 07/09/2017 02/15/17   Romero Belling, MD    Family History Family History  Problem Relation Age of Onset  . HIV/AIDS Father   . Drug abuse Father   . Drug abuse Sister   . Diabetes Cousin   . Suicidality Neg Hx   . Bipolar disorder Neg Hx   . Depression Neg Hx   . Anxiety disorder Neg Hx     Social History Social History   Tobacco Use  . Smoking status: Never Smoker  . Smokeless tobacco: Never Used  Substance Use Topics  . Alcohol use:  No  . Drug use: No     Allergies   Banana; Food; Ibuprofen; and Nsaids   Review of Systems Review of Systems  Constitutional: Negative for chills and fever.  HENT: Negative for facial swelling and sore throat.   Eyes: Positive for photophobia.  Respiratory: Negative for shortness of breath.   Cardiovascular: Negative for chest pain.  Gastrointestinal: Negative for abdominal pain, nausea and vomiting.  Genitourinary: Negative for dysuria.  Musculoskeletal: Positive for back pain and neck pain.  Skin: Negative for rash and wound.  Neurological: Positive for dizziness, numbness (Tingling to R shoulder) and headaches. Negative for facial asymmetry.  Psychiatric/Behavioral: The patient is not nervous/anxious.      Physical Exam Updated Vital Signs BP (!) 89/63   Pulse 66   Temp 98.8 F (37.1 C) (Oral)   Resp 18   Ht 5\' 6"  (1.676 m)   Wt 69.4 kg (153 lb)   SpO2 99%   BMI 24.69 kg/m   Physical Exam  Constitutional: He appears well-developed and well-nourished. No distress.  HENT:  Head: Normocephalic and atraumatic.  Mouth/Throat: Oropharynx is clear and moist. No oropharyngeal exudate.  Eyes: Pupils are equal, round, and reactive to light. Conjunctivae and EOM are normal. Right eye exhibits no discharge. Left eye exhibits no discharge. No scleral icterus.  Neck: Normal range of motion. Neck supple. No thyromegaly present.  Cardiovascular: Normal rate, regular rhythm, normal heart sounds and intact distal pulses. Exam reveals no gallop and no friction rub.  No murmur heard. Pulmonary/Chest: Effort normal and breath sounds normal. No stridor. No respiratory distress. He has no wheezes. He has no rales.  Abdominal: Soft. Bowel sounds are normal. He exhibits no distension. There is no tenderness. There is no rebound and no guarding.  Musculoskeletal: He exhibits no edema.  Midline cervical, thoracic, and lumbar tenderness Tenderness to the right-sided thoracic from C-spine  around to just lateral to trachea, no tenderness over the trachea, mild edema noted to these areas, no color change or ecchymosis Tenderness over the right TMJ, but not the remaining mandible  Lymphadenopathy:    He has no cervical adenopathy.  Neurological: He is alert. Coordination normal.  Sensation intact to all 4 extremities, equal bilateral grip strength, 5/5 strength to all 4 extremities  Skin: Skin is warm and dry. No rash noted. He is not diaphoretic. No pallor.  Psychiatric: He has a normal mood and affect.  Nursing note and vitals reviewed.    ED Treatments / Results  Labs (all labs ordered are listed, but only abnormal results are displayed) Labs Reviewed  I-STAT CHEM 8, ED - Abnormal; Notable for the following components:      Result Value   Creatinine, Ser 1.30 (*)    Glucose, Bld 138 (*)    All other components within normal limits    EKG None  Radiology  Dg Orthopantogram  Result Date: 07/10/2017 CLINICAL DATA:  RIGHT jaw pain, wrestling injury. EXAM: ORTHOPANTOGRAM/PANORAMIC COMPARISON:  None. FINDINGS: Open and close mouth Panorex. Bilateral mandible condyles translate to the articular eminence, projecting within glenoid fossa on closed view. Condyles are intact. IMPRESSION: Negative TMJ radiographs. Electronically Signed   By: Awilda Metro M.D.   On: 07/10/2017 02:53   Dg Thoracic Spine W/swimmers  Result Date: 07/09/2017 CLINICAL DATA:  Wrestling match, right neck pain and tingling down the right arm EXAM: THORACIC SPINE - 3 VIEWS COMPARISON:  None. FINDINGS: Minimal scoliosis. Sagittal alignment within normal limits. Vertebral body heights are normal. IMPRESSION: No acute osseous abnormality Electronically Signed   By: Jasmine Pang M.D.   On: 07/09/2017 23:01   Dg Lumbar Spine Complete  Result Date: 07/09/2017 CLINICAL DATA:  Wrestling injury, pain EXAM: LUMBAR SPINE - COMPLETE 4+ VIEW COMPARISON:  CT 08/26/2016 FINDINGS: There is no evidence of lumbar  spine fracture. Alignment is normal. Intervertebral disc spaces are maintained. IMPRESSION: Negative. Electronically Signed   By: Jasmine Pang M.D.   On: 07/09/2017 23:03   Mr Angiogram Neck W Or Wo Contrast  Result Date: 07/10/2017 CLINICAL DATA:  Slammed onto RIGHT neck at wrestling match. Headache and RIGHT neck pain, RIGHT arm paresthesias. EXAM: MRA NECK WITHOUT AND WITH CONTRAST TECHNIQUE: Multiplanar and multiecho pulse sequences of the neck were obtained without and with intravenous contrast. Angiographic images of the neck were obtained using MRA technique without and with intravenous contrast. CONTRAST:  14mL MULTIHANCE GADOBENATE DIMEGLUMINE 529 MG/ML IV SOLN COMPARISON:  None. FINDINGS: ANTERIOR CIRCULATION: The common carotid arteries are widely patent bilaterally. The carotid bifurcations are patent bilaterally without hemodynamically significant stenosis by NASCET criteria. Mild motion artifact through RIGHT ICA. No flow limiting stenosis or luminal irregularity. POSTERIOR CIRCULATION: Bilateral vertebral arteries are patent to the vertebrobasilar junction. No flow limiting stenosis or luminal irregularity. Source images and MIP images were reviewed. IMPRESSION: Negative contrast enhanced MRA neck. Electronically Signed   By: Awilda Metro M.D.   On: 07/10/2017 01:24   Mr Brain Wo Contrast  Result Date: 07/10/2017 CLINICAL DATA:  Slammed onto RIGHT neck at wrestling match. Headache and RIGHT neck pain, RIGHT arm paresthesias. EXAM: MRI HEAD WITHOUT CONTRAST MRI CERVICAL SPINE WITHOUT CONTRAST TECHNIQUE: Multiplanar, multiecho pulse sequences of the brain and surrounding structures, and cervical spine, to include the craniocervical junction and cervicothoracic junction, were obtained without intravenous contrast. COMPARISON:  None. FINDINGS: MRI HEAD FINDINGS INTRACRANIAL CONTENTS: No reduced diffusion to suggest acute ischemia or hyperacute demyelination. No susceptibility artifact to  suggest hemorrhage. The ventricles and sulci are normal for patient's age. No suspicious parenchymal signal, masses, mass effect. No abnormal extra-axial fluid collections. No extra-axial masses. VASCULAR: Normal major intracranial vascular flow voids present at skull base. SKULL AND UPPER CERVICAL SPINE: No abnormal sellar expansion. No suspicious calvarial bone marrow signal. Craniocervical junction maintained. SINUSES/ORBITS: Trace paranasal sinus mucosal thickening. Mastoid air cells are well aerated.The included ocular globes and orbital contents are non-suspicious. OTHER: None. MRI CERVICAL SPINE FINDINGS ALIGNMENT: Straightened cervical lordosis.  No malalignment. VERTEBRAE/DISCS: Vertebral bodies are intact. Intervertebral disc morphology's and signal are normal. CORD:Cervical spinal cord is normal morphology and signal characteristics from the cervicomedullary junction to level of T2-3, the most caudal well visualized level. POSTERIOR FOSSA, VERTEBRAL ARTERIES, PARASPINAL TISSUES: No MR findings of ligamentous injury. Vertebral artery flow voids present. Included posterior fossa and paraspinal soft tissues are normal. DISC LEVELS: C2-3 through C7-T1: No disc bulge, canal stenosis nor  neural foraminal narrowing. IMPRESSION: 1. Normal noncontrast MRI head. 2. Normal noncontrast MRI cervical spine. Electronically Signed   By: Awilda Metro M.D.   On: 07/10/2017 01:22   Mr Cervical Spine Wo Contrast  Result Date: 07/10/2017 CLINICAL DATA:  Slammed onto RIGHT neck at wrestling match. Headache and RIGHT neck pain, RIGHT arm paresthesias. EXAM: MRI HEAD WITHOUT CONTRAST MRI CERVICAL SPINE WITHOUT CONTRAST TECHNIQUE: Multiplanar, multiecho pulse sequences of the brain and surrounding structures, and cervical spine, to include the craniocervical junction and cervicothoracic junction, were obtained without intravenous contrast. COMPARISON:  None. FINDINGS: MRI HEAD FINDINGS INTRACRANIAL CONTENTS: No reduced  diffusion to suggest acute ischemia or hyperacute demyelination. No susceptibility artifact to suggest hemorrhage. The ventricles and sulci are normal for patient's age. No suspicious parenchymal signal, masses, mass effect. No abnormal extra-axial fluid collections. No extra-axial masses. VASCULAR: Normal major intracranial vascular flow voids present at skull base. SKULL AND UPPER CERVICAL SPINE: No abnormal sellar expansion. No suspicious calvarial bone marrow signal. Craniocervical junction maintained. SINUSES/ORBITS: Trace paranasal sinus mucosal thickening. Mastoid air cells are well aerated.The included ocular globes and orbital contents are non-suspicious. OTHER: None. MRI CERVICAL SPINE FINDINGS ALIGNMENT: Straightened cervical lordosis.  No malalignment. VERTEBRAE/DISCS: Vertebral bodies are intact. Intervertebral disc morphology's and signal are normal. CORD:Cervical spinal cord is normal morphology and signal characteristics from the cervicomedullary junction to level of T2-3, the most caudal well visualized level. POSTERIOR FOSSA, VERTEBRAL ARTERIES, PARASPINAL TISSUES: No MR findings of ligamentous injury. Vertebral artery flow voids present. Included posterior fossa and paraspinal soft tissues are normal. DISC LEVELS: C2-3 through C7-T1: No disc bulge, canal stenosis nor neural foraminal narrowing. IMPRESSION: 1. Normal noncontrast MRI head. 2. Normal noncontrast MRI cervical spine. Electronically Signed   By: Awilda Metro M.D.   On: 07/10/2017 01:22   Dg Hand Complete Left  Result Date: 07/10/2017 CLINICAL DATA:  Slammed hand in car door. EXAM: LEFT HAND - COMPLETE 3+ VIEW COMPARISON:  Hand radiograph December 04, 2013 FINDINGS: There is no evidence of fracture or dislocation. There is no evidence of arthropathy or other focal bone abnormality. Soft tissues are unremarkable. IMPRESSION: Negative. Electronically Signed   By: Awilda Metro M.D.   On: 07/10/2017 02:43     Procedures Procedures (including critical care time)  Medications Ordered in ED Medications  metoCLOPramide (REGLAN) injection 10 mg (10 mg Intravenous Given 07/09/17 2200)  morphine 4 MG/ML injection 4 mg (4 mg Intravenous Given 07/09/17 2200)  gadobenate dimeglumine (MULTIHANCE) injection 14 mL (14 mLs Intravenous Contrast Given 07/10/17 0108)     Initial Impression / Assessment and Plan / ED Course  I have reviewed the triage vital signs and the nursing notes.  Pertinent labs & imaging results that were available during my care of the patient were reviewed by me and considered in my medical decision making (see chart for details).  Clinical Course as of Jul 10 1637  Sun Jul 09, 2017  2251 I spoke with Dr. Amada Jupiter who advised MR of the brain and cervical spine as well as MRA of the head to rule out neurological injuries and dissection   [AL]  2251 On reassessment, patient's symptoms improved with Reglan and morphine.  Patient remains in c-collar which was placed on my evaluation.   [AL]    Clinical Course User Index [AL] Emi Holes, PA-C    Patient with neck pain and headache after being slammed into a mat during wrestling.  Patient is neurovascularly intact. I discussed patient case with  Dr. Amada JupiterKirkpatrick, neurologist, who advised MRI of the brain and C-spine as well as MRA..  Imaging of the TMJ ordered, however cannot be completed until after c-collar removed. Imaging of the thoracic and lumbar spine are negative.  At shift change, imaging is pending and care will be transferred to [employer] Browning, PA-C, for follow-up of these images.  Plan for discharge with supportive treatment, if negative.  Final Clinical Impressions(s) / ED Diagnoses   Final diagnoses:  Jaw pain  Neck pain  Musculoskeletal pain    ED Discharge Orders    None       Emi HolesLaw, Meshilem Machuca M, PA-C 07/10/17 1640    Little, Ambrose Finlandachel Morgan, MD 07/13/17 [phone]

## 2017-07-09 NOTE — ED Notes (Signed)
Pt placed in c-collar per PA

## 2017-07-10 ENCOUNTER — Emergency Department (HOSPITAL_COMMUNITY): Payer: BLUE CROSS/BLUE SHIELD

## 2017-07-10 MED ORDER — GADOBENATE DIMEGLUMINE 529 MG/ML IV SOLN
14.0000 mL | Freq: Once | INTRAVENOUS | Status: AC | PRN
  Administered 2017-07-10: 14 mL via INTRAVENOUS

## 2017-07-10 NOTE — ED Provider Notes (Signed)
Imaging is all negative.  Patient reassured.  Collar removed by me.  Recommend conservative therapy.   Tyler [name], Tyler Chahal, PA-C 07/10/17 0301    Little, Ambrose Finlandachel Morgan, MD 07/10/17 1130

## 2017-07-10 NOTE — ED Notes (Signed)
Pt understood dc material. NAD noted. 

## 2017-09-08 ENCOUNTER — Encounter (HOSPITAL_COMMUNITY): Payer: Self-pay

## 2017-09-08 ENCOUNTER — Ambulatory Visit (HOSPITAL_COMMUNITY)
Admission: EM | Admit: 2017-09-08 | Discharge: 2017-09-08 | Disposition: A | Discharge status: Home / Self Care | Payer: BLUE CROSS/BLUE SHIELD | Attending: Family Medicine | Admitting: Family Medicine

## 2017-09-08 DIAGNOSIS — W540XXA Bitten by dog, initial encounter: Secondary | ICD-10-CM

## 2017-09-08 DIAGNOSIS — S60922A Unspecified superficial injury of left hand, initial encounter: Secondary | ICD-10-CM | POA: Diagnosis not present

## 2017-09-08 DIAGNOSIS — M79642 Pain in left hand: Secondary | ICD-10-CM | POA: Diagnosis not present

## 2017-09-08 DIAGNOSIS — S50811A Abrasion of right forearm, initial encounter: Secondary | ICD-10-CM

## 2017-09-08 DIAGNOSIS — S81831A Puncture wound without foreign body, right lower leg, initial encounter: Secondary | ICD-10-CM | POA: Diagnosis not present

## 2017-09-08 DIAGNOSIS — M25541 Pain in joints of right hand: Secondary | ICD-10-CM

## 2017-09-08 MED ORDER — KETOROLAC TROMETHAMINE 30 MG/ML IJ SOLN
INTRAMUSCULAR | Status: AC
  Filled 2017-09-08: qty 1

## 2017-09-08 MED ORDER — KETOROLAC TROMETHAMINE 30 MG/ML IJ SOLN
30.0000 mg | Freq: Once | INTRAMUSCULAR | Status: AC
  Administered 2017-09-08: 30 mg via INTRAMUSCULAR

## 2017-09-08 MED ORDER — AMOXICILLIN-POT CLAVULANATE 875-125 MG PO TABS: 1.0000 | ORAL_TABLET | Freq: Two times a day (BID) | ORAL | 0 refills | Status: DC

## 2017-09-08 MED ORDER — ACETAMINOPHEN 325 MG PO TABS: 650.0000 mg | ORAL_TABLET | Freq: Four times a day (QID) | ORAL | 0 refills | Status: AC | PRN

## 2017-09-08 NOTE — Discharge Instructions (Addendum)
It was nice meeting you!!  Will will treat you with some antibiotics for infection prevention.  Work note in case you need it.  Tylenol for pain.  Toradol injection in clinic.  Follow up in 2 days for wound recheck or sooner if worse.

## 2017-09-08 NOTE — ED Triage Notes (Signed)
Pt presents with laceration to right arm from dog attack. Dog was his dog and he is vaccinated.

## 2017-09-08 NOTE — ED Provider Notes (Signed)
[facility]    CSN: [id number] Arrival date & time: 09/08/17  1437     History   Chief Complaint Chief Complaint  Patient presents with  . Animal Bite    HPI Tyler [name] is a 32 y.o. male.   Pt is a 32 year old male with PMH of diabetes. He presents with multiple abrasions from being attacked by his dog this am. He reports he was trying to break up a fight between his dog and another dog. He has a wound to the left hand, multiple scratches to the right forearm and bite to the right leg. His dog is up to date on vaccinations. He is up to date on tetanus.      Past Medical History:  Diagnosis Date  . Anxiety   . Depression   . Diabetic peripheral neuropathy (HCC)   . Hematemesis/vomiting blood admissions 09/2013, 01/02/2014  . Proteinuria   . PTSD (post-traumatic stress disorder)   . Stomach ulcer   . Type I diabetes mellitus [facility](HCC)     Patient Active Problem List   Diagnosis Date Noted  . Hematemesis with nausea 08/27/2016  . Renal insufficiency, mild 08/27/2016  . Insulin dependent type 1 diabetes mellitus (HCC) 08/27/2016  . Generalized abdominal pain   . Upper GI bleed   . Intermittent explosive disorder 11/21/2013  . GAD (generalized anxiety disorder) 11/21/2013  . Major depressive disorder, recurrent episode, moderate (HCC) 11/21/2013  . PTSD (post-traumatic stress disorder) 11/21/2013    Past Surgical History:  Procedure Laterality Date  . APPENDECTOMY    . ESOPHAGOGASTRODUODENOSCOPY N/A 01/03/2014   Procedure: ESOPHAGOGASTRODUODENOSCOPY (EGD);  Surgeon: Barrie FolkJohn C Hayes, MD;  [facility];  Service: Endoscopy;  Laterality: N/A;  . ESOPHAGOGASTRODUODENOSCOPY N/A 08/27/2016   Procedure: EGD with possible interventions;  Surgeon: Napoleon FormNandigam, Kavitha V, MD;  Location: [facility] ENDOSCOPY;  Service: Endoscopy;  Laterality: N/A;       Home Medications    Prior to Admission medications   Medication Sig Start Date End Date Taking? Authorizing  Provider  BD PEN NEEDLE NANO U/F 32G X 4 MM MISC USE AS DIRECTED WITH BASAGLAR 03/14/17  Yes Romero BellingEllison, Sean, MD  insulin aspart (NOVOLOG FLEXPEN) 100 UNIT/ML FlexPen Inject 2 Units into the skin 3 (three) times daily with meals. 02/15/17  Yes Romero BellingEllison, Sean, MD  Insulin Glargine (BASAGLAR KWIKPEN) 100 UNIT/ML SOPN Inject 0.15 mLs (15 Units total) into the skin at bedtime. 02/15/17  Yes Romero BellingEllison, Sean, MD  acetaminophen (TYLENOL) 325 MG tablet Take 2 tablets (650 mg total) by mouth every 6 (six) hours as needed for mild pain (or Fever >/= 101). 09/08/17   Dahlia ByesBast, Kais Monje A, NP  amoxicillin-clavulanate (AUGMENTIN) 875-125 MG tablet Take 1 tablet by mouth every 12 (twelve) hours. 09/08/17   Dahlia ByesBast, Rosio Weiss A, NP  metoCLOPramide (REGLAN) 5 MG tablet Take 1 tablet (5 mg total) by mouth 3 (three) times daily before meals. Patient not taking: Reported on 07/09/2017 02/15/17   Romero BellingEllison, Sean, MD  pantoprazole (PROTONIX) 40 MG tablet Take 40 mg by mouth two times a day 08/29/16   Marguerita MerlesSheikh, Omair Latif, DO  sertraline (ZOLOFT) 50 MG tablet Take one tablet daily for one week and then increase to two tablets daily. Patient taking differently: Take 100 mg by mouth daily.  02/23/17   Mliss SaxKremer, William Alfred, MD    Family History Family History  Problem Relation Age of Onset  . HIV/AIDS Father   . Drug abuse Father   . Drug abuse Sister   .  Diabetes Cousin   . Suicidality Neg Hx   . Bipolar disorder Neg Hx   . Depression Neg Hx   . Anxiety disorder Neg Hx     Social History Social History   Tobacco Use  . Smoking status: Never Smoker  . Smokeless tobacco: Never Used  Substance Use Topics  . Alcohol use: No  . Drug use: No     Allergies   Banana; Food; Ibuprofen; and Nsaids   Review of Systems Review of Systems  Constitutional: Negative for activity change.  Musculoskeletal: Positive for joint swelling.  Skin: Positive for wound.  Neurological: Negative for weakness and numbness.  Hematological: Does not  bruise/bleed easily.  All other systems reviewed and are negative.    Physical Exam Triage Vital Signs ED Triage Vitals [09/08/17 1448]  Enc Vitals Group     BP (!) 145/85     Pulse Rate (!) 111     Resp 18     Temp 98.8 F (37.1 C)     Temp src      SpO2 98 %     Weight      Height      Head Circumference      Peak Flow      Pain Score      Pain Loc      Pain Edu?      Excl. in GC?    No data found.  Updated Vital Signs BP (!) 145/85   Pulse (!) 111   Temp 98.8 F (37.1 C)   Resp 18   SpO2 98%   Visual Acuity Right Eye Distance:   Left Eye Distance:   Bilateral Distance:    Right Eye Near:   Left Eye Near:    Bilateral Near:     Physical Exam  Constitutional: He appears well-developed and well-nourished.  HENT:  Head: Normocephalic and atraumatic.  Neck: Normal range of motion.  Pulmonary/Chest: Effort normal.  Musculoskeletal: Normal range of motion.  Neurological: He is alert.  Skin: Skin is warm and dry.  1/2 cm superficial wound to the left hand proximal to the thumb. Limited Flexion and extension of the thumb due to pain. Mild erythema and swelling around the wound.  Multiple scratches to the right posterior and anterior forearm.   puncture to the right leg lateral to the knee.   Psychiatric: He has a normal mood and affect.  Nursing note and vitals reviewed.    UC Treatments / Results  Labs (all labs ordered are listed, but only abnormal results are displayed) Labs Reviewed - No data to display  EKG None  Radiology No results found.  Procedures Procedures (including critical care time)  Medications Ordered in UC Medications  ketorolac (TORADOL) 30 MG/ML injection 30 mg (30 mg Intramuscular Given 09/08/17 1539)    Initial Impression / Assessment and Plan / UC Course  I have reviewed the triage vital signs and the nursing notes.  Pertinent labs & imaging results that were available during my care of the patient were reviewed by  me and considered in my medical decision making (see chart for details).     Dog bite- will terat with Augmentin and have pt follow up in 2 days for recheck of the wounds or sooner if worsening pain or swelling.  Final Clinical Impressions(s) / UC Diagnoses   Final diagnoses:  Dog bite, initial encounter  Arthralgia of right hand     Discharge Instructions     It was  nice meeting you!!  Will will treat you with some antibiotics for infection prevention.  Work note in case you need it.  Tylenol for pain.  Toradol injection in clinic.  Follow up in 2 days for wound recheck or sooner if worse.      ED Prescriptions    Medication Sig Dispense Auth. Provider   amoxicillin-clavulanate (AUGMENTIN) 875-125 MG tablet Take 1 tablet by mouth every 12 (twelve) hours. 14 tablet Ramir Malerba A, NP   acetaminophen (TYLENOL) 325 MG tablet Take 2 tablets (650 mg total) by mouth every 6 (six) hours as needed for mild pain (or Fever >/= 101). 30 tablet Janace Aris, NP     Controlled Substance Prescriptions [place] Controlled Substance Registry consulted? no   Janace Aris, NP 09/10/17 1647

## 2017-11-30 ENCOUNTER — Encounter: Payer: Self-pay | Admitting: Endocrinology

## 2017-11-30 ENCOUNTER — Ambulatory Visit (INDEPENDENT_AMBULATORY_CARE_PROVIDER_SITE_OTHER): Payer: BLUE CROSS/BLUE SHIELD | Admitting: Endocrinology

## 2017-11-30 VITALS — BP 104/64 | HR 84 | Ht 66.0 in | Wt 153.6 lb

## 2017-11-30 DIAGNOSIS — E1021 Type 1 diabetes mellitus with diabetic nephropathy: Secondary | ICD-10-CM

## 2017-11-30 LAB — POCT GLYCOSYLATED HEMOGLOBIN (HGB A1C): HEMOGLOBIN A1C: 8 % — AB (ref 4.0–5.6)

## 2017-11-30 MED ORDER — BASAGLAR KWIKPEN 100 UNIT/ML ~~LOC~~ SOPN: 16.0000 [IU] | PEN_INJECTOR | SUBCUTANEOUS | 11 refills | Status: DC

## 2017-11-30 NOTE — Patient Instructions (Addendum)
Please increase the basalgar to 16 units daily.  Please take in the morning, and: continue the same novolog  On this type of insulin schedule, you should eat meals on a regular schedule.  If a meal is missed or significantly delayed, your blood sugar could go low. check your blood sugar 4 times a day.  vary the time of day when you check, between before the 3 meals, and at bedtime.  also check if you have symptoms of your blood sugar being too high or too low.  please keep a record of the readings and bring it to your next appointment here.  You can write it on any piece of paper.  please call us sooner if your blood sugar goes below 70, or if you have a lot of readings over 200.    Please come back for a follow-up appointment in 3 months.

## 2017-11-30 NOTE — Progress Notes (Signed)
Subjective:    Patient ID: Tyler [name], male    DOB: [birth year], 32 y.o.   MRN: [id number]  HPI Pt returns for f/u of diabetes mellitus:  DM type: 1 Dx'ed: 2010 Complications: nephropathy.  Therapy: insulin since soon after dx.  DKA: never Severe hypoglycemia: never.  Pancreatitis: never.  Other: he takes multiple daily injections, but emphasizes basal insulin; he works 2nd shift, [employer]; he declines pump rx for now.   Interval history: pt says he seldom misses the insulin. no cbg record, but states cbg's are well-controlled.  He has mild hypoglycemia if a meal is missed.   Past Medical History:  Diagnosis Date  . Anxiety   . Depression   . Diabetic peripheral neuropathy (HCC)   . Hematemesis/vomiting blood admissions 09/2013, 01/02/2014  . Proteinuria   . PTSD (post-traumatic stress disorder)   . Stomach ulcer   . Type I diabetes mellitus (HCC)     Past Surgical History:  Procedure Laterality Date  . APPENDECTOMY    . ESOPHAGOGASTRODUODENOSCOPY N/A 01/03/2014   Procedure: ESOPHAGOGASTRODUODENOSCOPY (EGD);  Surgeon: Barrie Folk, MD;  Location: [facility] ENDOSCOPY;  Service: Endoscopy;  Laterality: N/A;  . ESOPHAGOGASTRODUODENOSCOPY N/A 08/27/2016   Procedure: EGD with possible interventions;  Surgeon: Napoleon Form, MD;  Location: [facility] ENDOSCOPY;  Service: Endoscopy;  Laterality: N/A;    Social History   Socioeconomic History  . Marital status: Single    Spouse name: Not on file  . Number of children: 1  . Years of education: Not on file  . Highest education level: Not on file  Occupational History  . Not on file  Social Needs  . Financial resource strain: Not on file  . Food insecurity:    Worry: Not on file    Inability: Not on file  . Transportation needs:    Medical: Not on file    Non-medical: Not on file  Tobacco Use  . Smoking status: Never Smoker  . Smokeless tobacco: Never Used  Substance and Sexual Activity  . Alcohol use: No  . Drug use: No  .  Sexual activity: Yes  Lifestyle  . Physical activity:    Days per week: Not on file    Minutes per session: Not on file  . Stress: Not on file  Relationships  . Social connections:    Talks on phone: Not on file    Gets together: Not on file    Attends religious service: Not on file    Active member of club or organization: Not on file    Attends meetings of clubs or organizations: Not on file    Relationship status: Not on file  . Intimate partner violence:    Fear of current or ex partner: Not on file    Emotionally abused: Not on file    Physically abused: Not on file    Forced sexual activity: Not on file  Other Topics Concern  . Not on file  Social History Narrative  . Not on file    Current Outpatient Medications on File Prior to Visit  Medication Sig Dispense Refill  . acetaminophen (TYLENOL) 325 MG tablet Take 2 tablets (650 mg total) by mouth every 6 (six) hours as needed for mild pain (or Fever >/= 101). 30 tablet 0  . amoxicillin-clavulanate (AUGMENTIN) 875-125 MG tablet Take 1 tablet by mouth every 12 (twelve) hours. 14 tablet 0  . BD PEN NEEDLE NANO U/F 32G X 4 MM MISC USE AS DIRECTED WITH  BASAGLAR 100 each 2  . insulin aspart (NOVOLOG FLEXPEN) 100 UNIT/ML FlexPen Inject 2 Units into the skin 3 (three) times daily with meals. 15 mL 2  . metoCLOPramide (REGLAN) 5 MG tablet Take 1 tablet (5 mg total) by mouth 3 (three) times daily before meals. 90 tablet 0  . pantoprazole (PROTONIX) 40 MG tablet Take 40 mg by mouth two times a day 60 tablet 0  . sertraline (ZOLOFT) 50 MG tablet Take one tablet daily for one week and then increase to two tablets daily. (Patient taking differently: Take 100 mg by mouth daily. ) 60 tablet 0   No current facility-administered medications on file prior to visit.     Allergies  Allergen Reactions  . Banana Anaphylaxis  . Food Itching and Swelling    Walnuts- THROAT ITCHES AND EYES SWELL  . Ibuprofen Other (See Comments)    ULCERS  .  Nsaids Other (See Comments)    ULCERS     Family History  Problem Relation Age of Onset  . HIV/AIDS Father   . Drug abuse Father   . Drug abuse Sister   . Diabetes Cousin   . Suicidality Neg Hx   . Bipolar disorder Neg Hx   . Depression Neg Hx   . Anxiety disorder Neg Hx     BP 104/64   Pulse 84   Ht 5\' 6"  (1.676 m)   Wt 153 lb 9.6 oz (69.7 kg)   SpO2 96%   BMI 24.79 kg/m    Review of Systems Denies LOC    Objective:   Physical Exam VITAL SIGNS:  See vs page GENERAL: no distress Pulses: dorsalis pedis intact bilat.   MSK: no deformity of the feet CV: no leg edema Skin:  no ulcer on the feet.  normal color and temp on the feet. Neuro: sensation is intact to touch on the feet  Lab Results  Component Value Date   HGBA1C 8.0 (A) 11/30/2017       Assessment & Plan:  Type 1 DM, with nephropathy: worse Hypoglycemia, due to missed or delayed meals. Noncompliance with cbg recording, and f/u ov's.  This compromises the rx of DM.   Patient Instructions  Please increase the basalgar to 16 units daily.  Please take in the morning, and: continue the same novolog  On this type of insulin schedule, you should eat meals on a regular schedule.  If a meal is missed or significantly delayed, your blood sugar could go low. check your blood sugar 4 times a day.  vary the time of day when you check, between before the 3 meals, and at bedtime.  also check if you have symptoms of your blood sugar being too high or too low.  please keep a record of the readings and bring it to your next appointment here.  You can write it on any piece of paper.  please call us sooner if your blood sugar goes below 70, or if you have a lot of readings over 200.    Please come back for a follow-up appointment in 3 months.

## 2017-12-05 ENCOUNTER — Ambulatory Visit: Payer: BLUE CROSS/BLUE SHIELD | Admitting: Family Medicine

## 2017-12-05 DIAGNOSIS — Z0289 Encounter for other administrative examinations: Secondary | ICD-10-CM

## 2018-03-05 ENCOUNTER — Telehealth: Payer: Self-pay | Admitting: Endocrinology

## 2018-03-05 ENCOUNTER — Other Ambulatory Visit: Payer: Self-pay

## 2018-03-05 DIAGNOSIS — E1021 Type 1 diabetes mellitus with diabetic nephropathy: Secondary | ICD-10-CM

## 2018-03-05 MED ORDER — BASAGLAR KWIKPEN 100 UNIT/ML ~~LOC~~ SOPN: 16.0000 [IU] | PEN_INJECTOR | SUBCUTANEOUS | 11 refills | Status: DC

## 2018-03-05 NOTE — Telephone Encounter (Signed)
Rx has been sent  

## 2018-03-05 NOTE — Telephone Encounter (Signed)
MEDICATION: Insulin Glargine (BASAGLAR KWIKPEN) 100 UNIT/ML SOPN AND BD PEN NEEDLE NANO U/F 32G X 4 MM MISC  PHARMACY:  [facility] [address] [place] [address] (now preferred pharmacy)  IS THIS A 90 DAY SUPPLY : Yes   IS PATIENT OUT OF MEDICATION: Yes  IF NOT; HOW MUCH IS LEFT: Out since 03/03/18  LAST APPOINTMENT DATE: @11 /07/2017  NEXT APPOINTMENT DATE:@2 /11/2018  DO WE HAVE YOUR PERMISSION TO LEAVE A DETAILED MESSAGE: Yes  OTHER COMMENTS: Patient wants preferred Pharmacy changed to:   PHARMACY:  [facility] [address] [place] [address]  **Let patient know to contact pharmacy at the end of the day to make sure medication is ready. **  ** Please notify patient to allow 48-72 hours to process**  **Encourage patient to contact the pharmacy for refills or they can request refills through [facility]**

## 2018-03-05 NOTE — Telephone Encounter (Signed)
°  Patient called back and stated that he would like his prescription sent into a different pharmacy that the one he asked for this morning    Insulin Glargine (BASAGLAR KWIKPEN) 100 UNIT/ML SOPN AND BD PEN NEEDLE NANO U/F 32G X 4 MM MISC  [id number] - [place], [place] - [address]

## 2018-03-06 ENCOUNTER — Ambulatory Visit: Payer: Self-pay | Admitting: Endocrinology

## 2018-03-06 ENCOUNTER — Other Ambulatory Visit: Payer: Self-pay

## 2018-03-06 ENCOUNTER — Telehealth: Payer: Self-pay | Admitting: Endocrinology

## 2018-03-06 DIAGNOSIS — Z0289 Encounter for other administrative examinations: Secondary | ICD-10-CM

## 2018-03-06 DIAGNOSIS — E1021 Type 1 diabetes mellitus with diabetic nephropathy: Secondary | ICD-10-CM

## 2018-03-06 MED ORDER — BASAGLAR KWIKPEN 100 UNIT/ML ~~LOC~~ SOPN: 16.0000 [IU] | PEN_INJECTOR | SUBCUTANEOUS | 11 refills | Status: DC

## 2018-03-06 NOTE — Telephone Encounter (Signed)
Patient stated that the pharmacy told him the prescription that was transferred is expired and they would need a new one sent in    [facility]

## 2018-03-06 NOTE — Telephone Encounter (Signed)
Patient no showed today's appt. Please advise on how to follow up. °A. No follow up necessary. °B. Follow up urgent. Contact patient immediately. °C. Follow up necessary. Contact patient and schedule visit in ___ days. °D. Follow up advised. Contact patient and schedule visit in ____weeks. ° °Would you like the NS fee to be applied to this visit? ° °

## 2018-03-06 NOTE — Telephone Encounter (Signed)
Please refer to Dr. Ellison's response 

## 2018-03-06 NOTE — Telephone Encounter (Signed)
Called pt and informed to request transfer of his Rx from [facility] to [facility]. Verbalized acceptance and understanding.

## 2018-03-06 NOTE — Telephone Encounter (Signed)
Insulin Glargine (BASAGLAR KWIKPEN) 100 UNIT/ML SOPN 15 mL 11 03/06/2018    Sig - Route: Inject 0.16 mLs (16 Units total) into the skin every morning. - Subcutaneous   Sent to pharmacy as: Insulin Glargine (BASAGLAR KWIKPEN) 100 UNIT/ML Solution Pen-injector   E-Prescribing Status: Receipt confirmed by pharmacy (03/06/2018 10:36 AM EST)    Spoke with pharmacist. Confirmed receipt of above Rx. States they do have the medication in stock and are processing now.

## 2018-03-06 NOTE — Telephone Encounter (Signed)
Please schedule f/u appt for next available appointment  

## 2018-03-07 NOTE — Telephone Encounter (Signed)
LMTCB to reschedule missed appointment °

## 2018-03-11 ENCOUNTER — Inpatient Hospital Stay (HOSPITAL_COMMUNITY)
Admission: AD | Admit: 2018-03-11 | Discharge: 2018-03-13 | DRG: 379 | Disposition: A | Discharge status: Home / Self Care | Payer: BLUE CROSS/BLUE SHIELD | Source: Other Acute Inpatient Hospital | Attending: Internal Medicine | Admitting: Internal Medicine

## 2018-03-11 ENCOUNTER — Other Ambulatory Visit: Payer: Self-pay

## 2018-03-11 ENCOUNTER — Encounter (HOSPITAL_COMMUNITY): Payer: Self-pay | Admitting: Internal Medicine

## 2018-03-11 DIAGNOSIS — F419 Anxiety disorder, unspecified: Secondary | ICD-10-CM | POA: Diagnosis not present

## 2018-03-11 DIAGNOSIS — K920 Hematemesis: Secondary | ICD-10-CM | POA: Diagnosis present

## 2018-03-11 DIAGNOSIS — Z91018 Allergy to other foods: Secondary | ICD-10-CM | POA: Diagnosis not present

## 2018-03-11 DIAGNOSIS — N182 Chronic kidney disease, stage 2 (mild): Secondary | ICD-10-CM | POA: Diagnosis present

## 2018-03-11 DIAGNOSIS — Z794 Long term (current) use of insulin: Secondary | ICD-10-CM | POA: Diagnosis not present

## 2018-03-11 DIAGNOSIS — R1010 Upper abdominal pain, unspecified: Secondary | ICD-10-CM | POA: Diagnosis not present

## 2018-03-11 DIAGNOSIS — F329 Major depressive disorder, single episode, unspecified: Secondary | ICD-10-CM | POA: Diagnosis present

## 2018-03-11 DIAGNOSIS — K219 Gastro-esophageal reflux disease without esophagitis: Secondary | ICD-10-CM | POA: Diagnosis present

## 2018-03-11 DIAGNOSIS — E1022 Type 1 diabetes mellitus with diabetic chronic kidney disease: Secondary | ICD-10-CM | POA: Diagnosis not present

## 2018-03-11 DIAGNOSIS — K922 Gastrointestinal hemorrhage, unspecified: Secondary | ICD-10-CM | POA: Diagnosis present

## 2018-03-11 DIAGNOSIS — Z886 Allergy status to analgesic agent status: Secondary | ICD-10-CM | POA: Diagnosis not present

## 2018-03-11 DIAGNOSIS — N289 Disorder of kidney and ureter, unspecified: Secondary | ICD-10-CM

## 2018-03-11 DIAGNOSIS — Z79899 Other long term (current) drug therapy: Secondary | ICD-10-CM | POA: Diagnosis not present

## 2018-03-11 DIAGNOSIS — E1042 Type 1 diabetes mellitus with diabetic polyneuropathy: Secondary | ICD-10-CM | POA: Diagnosis present

## 2018-03-11 DIAGNOSIS — E109 Type 1 diabetes mellitus without complications: Secondary | ICD-10-CM | POA: Diagnosis present

## 2018-03-11 DIAGNOSIS — E10649 Type 1 diabetes mellitus with hypoglycemia without coma: Secondary | ICD-10-CM | POA: Diagnosis present

## 2018-03-11 LAB — CBC WITH DIFFERENTIAL/PLATELET
Abs Immature Granulocytes: 0 10*3/uL (ref 0.00–0.07)
BASOS PCT: 1 %
Basophils Absolute: 0 10*3/uL (ref 0.0–0.1)
Eosinophils Absolute: 0 10*3/uL (ref 0.0–0.5)
Eosinophils Relative: 0 %
HCT: 42.9 % (ref 39.0–52.0)
Hemoglobin: 13.7 g/dL (ref 13.0–17.0)
Immature Granulocytes: 0 %
Lymphocytes Relative: 39 %
Lymphs Abs: 1.5 10*3/uL (ref 0.7–4.0)
MCH: 26.6 pg (ref 26.0–34.0)
MCHC: 31.9 g/dL (ref 30.0–36.0)
MCV: 83.3 fL (ref 80.0–100.0)
Monocytes Absolute: 0.4 10*3/uL (ref 0.1–1.0)
Monocytes Relative: 10 %
NEUTROS ABS: 1.9 10*3/uL (ref 1.7–7.7)
Neutrophils Relative %: 50 %
PLATELETS: 125 10*3/uL — AB (ref 150–400)
RBC: 5.15 MIL/uL (ref 4.22–5.81)
RDW: 12.1 % (ref 11.5–15.5)
WBC: 3.8 10*3/uL — ABNORMAL LOW (ref 4.0–10.5)
nRBC: 0 % (ref 0.0–0.2)

## 2018-03-11 LAB — COMPREHENSIVE METABOLIC PANEL
ALT: 18 U/L (ref 0–44)
AST: 28 U/L (ref 15–41)
Albumin: 3.7 g/dL (ref 3.5–5.0)
Alkaline Phosphatase: 50 U/L (ref 38–126)
Anion gap: 10 (ref 5–15)
BUN: 11 mg/dL (ref 6–20)
CO2: 26 mmol/L (ref 22–32)
Calcium: 9.1 mg/dL (ref 8.9–10.3)
Chloride: 103 mmol/L (ref 98–111)
Creatinine, Ser: 1.34 mg/dL — ABNORMAL HIGH (ref 0.61–1.24)
GFR calc Af Amer: >60 mL/min (ref >60)
GFR calc non Af Amer: >60 mL/min (ref >60)
Glucose, Bld: 88 mg/dL (ref 70–99)
Potassium: 3.9 mmol/L (ref 3.5–5.1)
Sodium: 139 mmol/L (ref 135–145)
TOTAL PROTEIN: 6.9 g/dL (ref 6.5–8.1)
Total Bilirubin: 1.2 mg/dL (ref 0.3–1.2)

## 2018-03-11 LAB — GLUCOSE, CAPILLARY
Glucose-Capillary: 148 mg/dL — ABNORMAL HIGH (ref 70–99)
Glucose-Capillary: 104 mg/dL — ABNORMAL HIGH (ref 70–99)
Glucose-Capillary: 113 mg/dL — ABNORMAL HIGH (ref 70–99)
Glucose-Capillary: 60 mg/dL — ABNORMAL LOW (ref 70–99)
Glucose-Capillary: 102 mg/dL — ABNORMAL HIGH (ref 70–99)
Glucose-Capillary: 208 mg/dL — ABNORMAL HIGH (ref 70–99)

## 2018-03-11 MED ORDER — ACETAMINOPHEN 650 MG RE SUPP: 650.0000 mg | Freq: Four times a day (QID) | RECTAL | Status: DC | PRN

## 2018-03-11 MED ORDER — INSULIN ASPART 100 UNIT/ML ~~LOC~~ SOLN
0.0000 [IU] | Freq: Three times a day (TID) | SUBCUTANEOUS | Status: DC
  Administered 2018-03-12: 2 [IU] via SUBCUTANEOUS

## 2018-03-11 MED ORDER — SERTRALINE HCL 100 MG PO TABS: 100.0000 mg | ORAL_TABLET | Freq: Every day | ORAL | Status: DC

## 2018-03-11 MED ORDER — PANTOPRAZOLE SODIUM 40 MG IV SOLR: 40.0000 mg | Freq: Two times a day (BID) | INTRAVENOUS | Status: DC

## 2018-03-11 MED ORDER — SODIUM CHLORIDE 0.9 % IV SOLN: INTRAVENOUS | Status: DC

## 2018-03-11 MED ORDER — DEXTROSE-NACL 5-0.45 % IV SOLN
INTRAVENOUS | Status: DC
  Administered 2018-03-11 – 2018-03-12 (×2): via INTRAVENOUS

## 2018-03-11 MED ORDER — DEXTROSE 50 % IV SOLN
INTRAVENOUS | Status: AC
  Administered 2018-03-11: 25 mL
  Filled 2018-03-11: qty 50

## 2018-03-11 MED ORDER — INSULIN GLARGINE 100 UNIT/ML ~~LOC~~ SOLN
16.0000 [IU] | Freq: Every day | SUBCUTANEOUS | Status: DC
  Administered 2018-03-12: 16 [IU] via SUBCUTANEOUS
  Filled 2018-03-11: qty 0.16

## 2018-03-11 MED ORDER — ACETAMINOPHEN 325 MG PO TABS
650.0000 mg | ORAL_TABLET | Freq: Four times a day (QID) | ORAL | Status: DC | PRN
  Administered 2018-03-11 (×2): 650 mg via ORAL
  Filled 2018-03-11 (×2): qty 2

## 2018-03-11 MED ORDER — ONDANSETRON HCL 4 MG PO TABS
4.0000 mg | ORAL_TABLET | Freq: Four times a day (QID) | ORAL | Status: DC | PRN
  Administered 2018-03-11: 4 mg via ORAL
  Filled 2018-03-11: qty 1

## 2018-03-11 MED ORDER — SODIUM CHLORIDE 0.9 % IV SOLN
8.0000 mg/h | INTRAVENOUS | Status: DC
  Administered 2018-03-11 – 2018-03-12 (×3): 8 mg/h via INTRAVENOUS
  Filled 2018-03-11 (×6): qty 80

## 2018-03-11 MED ORDER — ONDANSETRON HCL 4 MG/2ML IJ SOLN: 4.0000 mg | Freq: Four times a day (QID) | INTRAMUSCULAR | Status: DC | PRN

## 2018-03-11 NOTE — Progress Notes (Signed)
CCMD called about patient not having an order for telemetry monitoring, MD Yates paged, she said that patient does not need tele. CCMD aware and they are going to take the patient off telemetry.

## 2018-03-11 NOTE — H&P (View-Only) (Signed)
Consultation  Referring Provider: Dr. Ophelia Charter     Primary Care Physician:  Mliss Sax, MD Primary Gastroenterologist: Dr. Lavon Paganini       Reason for Consultation: Hematemesis, abdominal pain            HPI:   Tyler [name] is a 33 y.o. male with a past medical history as listed below including anxiety, depression and previous episodes of hematemesis, who transferred from [facility] today with a complaint of hematemesis.    Today, patient explains that he has been having some epigastric abdominal pain for about 1 to 2 months, as he ran out of his Protonix about a month or so ago, last night he started throwing up bright red blood while at work around 3:00 in the morning.  Describes just one episode of hematemesis..  Tells me the pain change some starting in his right upper quadrant and then seeming to radiate throughout his abdomen.  Yesterday was a 10/10, today a 7-8/10.  Associated symptoms include a "bad headache" and some dizziness.  Was having some breakthrough heartburn or reflux.    Denies fever, chills, weight loss, anorexia or melena.  [facility] ED course: CTA of the abdomen and pelvis with abnormal focus of enhancement in the mid jejunal region anterior to the left kidney, only in the arterial phase phase, no persistent contrast in the venous phase, question angiodysplasia in the jejunum, active GI bleed was not confirmed on that study  GI history: 08/27/2016 Dr. Lavon Paganini EGD: Normal 09/08/2016 office visit with me: At that time doing well on pantoprazole twice daily  Past Medical History:  Diagnosis Date  . Anxiety   . Depression   . Diabetic peripheral neuropathy (HCC)   . Hematemesis/vomiting blood admissions 09/2013, 01/02/2014  . Proteinuria   . PTSD (post-traumatic stress disorder)   . Stomach ulcer   . Type I diabetes mellitus (HCC)     Past Surgical History:  Procedure Laterality Date  . APPENDECTOMY    . ESOPHAGOGASTRODUODENOSCOPY N/A  01/03/2014   Procedure: ESOPHAGOGASTRODUODENOSCOPY (EGD);  Surgeon: Barrie Folk, MD;  Location: [facility] ENDOSCOPY;  Service: Endoscopy;  Laterality: N/A;  . ESOPHAGOGASTRODUODENOSCOPY N/A 08/27/2016   Procedure: EGD with possible interventions;  Surgeon: Napoleon Form, MD;  Location: [facility] ENDOSCOPY;  Service: Endoscopy;  Laterality: N/A;    Family History  Problem Relation Age of Onset  . HIV/AIDS Father   . Drug abuse Father   . Drug abuse Sister   . Diabetes Cousin   . Suicidality Neg Hx   . Bipolar disorder Neg Hx   . Depression Neg Hx   . Anxiety disorder Neg Hx     Social History   Tobacco Use  . Smoking status: Never Smoker  . Smokeless tobacco: Never Used  Substance Use Topics  . Alcohol use: No  . Drug use: No    Prior to Admission medications   Medication Sig Start Date End Date Taking? Authorizing Provider  acetaminophen (TYLENOL) 325 MG tablet Take 2 tablets (650 mg total) by mouth every 6 (six) hours as needed for mild pain (or Fever >/= 101). 09/08/17   Janace Aris, NP  BD PEN NEEDLE NANO U/F 32G X 4 MM MISC USE AS DIRECTED WITH BASAGLAR 03/14/17   Romero Belling, MD  insulin aspart (NOVOLOG FLEXPEN) 100 UNIT/ML FlexPen Inject 2 Units into the skin 3 (three) times daily with meals. 02/15/17   Romero Belling, MD  Insulin Glargine [facility]) 100 UNIT/ML  SOPN Inject 0.16 mLs (16 Units total) into the skin every morning. 03/06/18   Romero BellingEllison, Sean, MD  metoCLOPramide (REGLAN) 5 MG tablet Take 1 tablet (5 mg total) by mouth 3 (three) times daily before meals. 02/15/17   Romero BellingEllison, Sean, MD  pantoprazole (PROTONIX) 40 MG tablet Take 40 mg by mouth two times a day 08/29/16   Marguerita MerlesSheikh, Omair Latif, DO  sertraline (ZOLOFT) 50 MG tablet Take one tablet daily for one week and then increase to two tablets daily. Patient taking differently: Take 100 mg by mouth daily.  02/23/17   Mliss SaxKremer, William Alfred, MD    Current Facility-Administered Medications  Medication Dose Route Frequency  Provider Last Rate Last Dose  . acetaminophen (TYLENOL) tablet 650 mg  650 mg Oral Q6H PRN Jonah BlueYates, Kaisa Wofford, MD   650 mg at 03/11/18 1151   Or  . acetaminophen (TYLENOL) suppository 650 mg  650 mg Rectal Q6H PRN Jonah BlueYates, Maja Mccaffery, MD      . Melene Muller[START ON 03/12/2018] Mariella SaaBASAGLAR [facility] KwikPen 16 Units  16 Units Subcutaneous Wille CelesteBH-q7a Yates, Cobie Marcoux, MD      . dextrose 5 %-0.45 % sodium chloride infusion   Intravenous Continuous Jonah BlueYates, Paitynn Mikus, MD      . insulin aspart (novoLOG) injection 0-15 Units  0-15 Units Subcutaneous TID WC Jonah BlueYates, Ludene Stokke, MD      . ondansetron [facility](ZOFRAN) tablet 4 mg  4 mg Oral Q6H PRN Jonah BlueYates, Eldine Rencher, MD   4 mg at 03/11/18 1151   Or  . ondansetron (ZOFRAN) injection 4 mg  4 mg Intravenous Q6H PRN Jonah BlueYates, Tyese Finken, MD      . Melene Muller[START ON 03/12/2018] sertraline (ZOLOFT) tablet 100 mg  100 mg Oral Daily Jonah BlueYates, Trannie Bardales, MD        Allergies as of 03/11/2018 - Review Complete 03/11/2018  Allergen Reaction Noted  . 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 Food Itching and Swelling 09/05/2015  . Ibuprofen Other (See Comments) 09/28/2011  . Nsaids Other (See Comments) 08/27/2016     Review of Systems:    Constitutional: No weight loss, fever or chills Skin: No rash  Cardiovascular: No chest pain Respiratory: No SOB Gastrointestinal: See HPI and otherwise negative Genitourinary: No dysuria  Neurological: No headache, dizziness or syncope Musculoskeletal: No new muscle or joint pain Hematologic: [place] Psychiatric: No history of depression or anxiety     Physical Exam:  Vital signs in last 24 hours: unavailable   General:   Pleasant AA male appears to be in NAD, Well developed, Well nourished, alert and cooperative Head:  Normocephalic and atraumatic. Eyes:   PEERL, EOMI. No icterus. Conjunctiva pink. Ears:  Normal auditory acuity. Neck:  Supple Throat: Oral cavity and pharynx without inflammation, swelling or lesion. Lungs: Respirations even and unlabored. Lungs clear to  auscultation bilaterally.   No wheezes, crackles, or rhonchi.  Heart: Normal S1, S2. No MRG. Regular rate and rhythm. No peripheral edema, cyanosis or pallor.  Abdomen:  Soft, nondistended, marked right upper quadrant TTP with involuntary guarding. Normal bowel sounds. No appreciable masses or hepatomegaly. Rectal:  Not performed.  Msk:  Symmetrical without gross deformities. Peripheral pulses intact.  Extremities:  Without edema, no deformity or joint abnormality.  Neurologic:  Alert and  oriented x4;  grossly normal neurologically.  Skin:   Dry and intact without significant lesions or rashes. Psychiatric: Demonstrates good judgement and reason without abnormal affect or behaviors.   [facility]   Impression / Plan:   Impression: 1.  Abdominal  pain: Continues currently in the right upper quadrant, has been off of Protonix for 2 months; Consider PUD vs gastritis vs other 2.  Hematemesis: 1 episode around 3:00 in the morning, none since, previous history of similar episodes, last in 2018 and previously 2015 with normal EGDs, recent CTA with question of enhancement in the jejunum  Plan: 1.  If he has further signs of acute GI bleed then would recommend that you call our service. 2.  Continue IV PPI twice daily 3.  Will discuss CTA with Dr. Adela Lank. 4.  Patient has had EGDs on 2 previous occasions for hematemesis with no findings.  The last in 2018.  Scheduled patient for an enteroscopy given abnormal CTA with Dr. Russella Dar tomorrow.  Did discuss risks, benefits, limitations and alternatives and the patient agrees to proceed. 5.  Patient to be on a clear liquid diet today and n.p.o. after midnight. 6.  Please await any further recommendations with Dr. Adela Lank later today.  Thank you for your kind consultation, we will continue to follow.  Violet Baldy [facility]  03/11/2018, 11:56 AM

## 2018-03-11 NOTE — H&P (Signed)
History and Physical    Tyler [name] ZOX:[id number] DOB: [birth year] DOA: 03/11/2018  PCP: Mliss SaxKremer, William Alfred, MD Consultants:  Everardo AllEllison - endocrinology Patient coming from:  Home - lives alone; [facility]: Sisters, [phone], Olene CravenKristian  Chief Complaint: GI bleeding  HPI: Tyler CahillMarcus Roets is a 33 y.o. male with medical history significant of type 1 DM; stomach ulcer with multiple admissions for hematemesis; and PTSD with depression and anxiety presenting with hematemesis.  He has bene having abdominal pain for a while - ran out of Protonix and couldn't get anymore.  Last night, he started throwing up blood.  He has had mild abdominal for a month or two, but it was bearable.  He ran out of Protonix months ago.  He was at work overnight and he began having hematemesis while at work.  His abdominal pain worsened about an hour before it happened.  It started in the RUQ and then it became diffuse.  He had a bad headache and got a little dizzy.  He had 2 episodes of hematemesis, at the same time, and has not had further vomiting since.  He is not on an insulin pump.  His sugar usually runs 120-150.  Recent high was 170.  His last A1c was 3 months ago and he is not sure what it was.  4/18 EGD by Dr. Lavon PaganiniNandigam was normal.   ED Course: [facility]  Review of Systems: As per HPI; otherwise review of systems reviewed and negative.   Ambulatory Status:  Ambulates without assistance  Past Medical History:  Diagnosis Date  . Anxiety   . Depression   . Diabetic peripheral neuropathy (HCC)   . Hematemesis/vomiting blood admissions 09/2013, 01/02/2014  . Proteinuria   . PTSD (post-traumatic stress disorder)   . Stomach ulcer   . Type I diabetes mellitus (HCC)     Past Surgical History:  Procedure Laterality Date  . APPENDECTOMY    . ESOPHAGOGASTRODUODENOSCOPY N/A 01/03/2014   Procedure: ESOPHAGOGASTRODUODENOSCOPY (EGD);  Surgeon: Barrie FolkJohn C Hayes, MD;  [facility];  Service: Endoscopy;  Laterality: N/A;  . ESOPHAGOGASTRODUODENOSCOPY N/A 08/27/2016   Procedure: EGD with possible interventions;  Surgeon: Napoleon FormNandigam, Kavitha V, MD;  Location: [facility] ENDOSCOPY;  Service: Endoscopy;  Laterality: N/A;    Social History   Socioeconomic History  . Marital status: Single    Spouse name: Not on file  . Number of children: 1  . Years of education: Not on file  . Highest education level: Not on file  Occupational History  . Occupation: Naval architectwarehouse work  Engineer, productionocial Needs  . Financial resource strain: Not on file  . Food insecurity:    Worry: Not on file    Inability: Not on file  . Transportation needs:    Medical: Not on file    Non-medical: Not on file  Tobacco Use  . Smoking status: Never Smoker  . Smokeless tobacco: Never Used  Substance and Sexual Activity  . Alcohol use: No  . Drug use: No  . Sexual activity: Yes  Lifestyle  . Physical activity:    Days per week: Not on file    Minutes per session: Not on file  . Stress: Not on file  Relationships  . Social connections:    Talks on phone: Not on file    Gets together: Not on file    Attends religious service: Not on file    Active member of club or organization: Not on file  Attends meetings of clubs or organizations: Not on file    Relationship status: Not on file  . Intimate partner violence:    Fear of current or ex partner: Not on file    Emotionally abused: Not on file    Physically abused: Not on file    Forced sexual activity: Not on file  Other Topics Concern  . Not on file  Social History Narrative  . Not on file    Allergies  Allergen Reactions  . Banana Anaphylaxis  . Food Itching and Swelling    Walnuts- THROAT ITCHES AND EYES SWELL  . Ibuprofen Other (See Comments)    ULCERS  . Nsaids Other (See Comments)    ULCERS     Family History  Problem Relation Age of Onset  . HIV/AIDS Father   . Drug abuse Father   . Drug abuse Sister   . Diabetes Cousin   .  Suicidality Neg Hx   . Bipolar disorder Neg Hx   . Depression Neg Hx   . Anxiety disorder Neg Hx     Prior to Admission medications   Medication Sig Start Date End Date Taking? Authorizing Provider  acetaminophen (TYLENOL) 325 MG tablet Take 2 tablets (650 mg total) by mouth every 6 (six) hours as needed for mild pain (or Fever >/= 101). 09/08/17   Dahlia Byes A, NP  amoxicillin-clavulanate (AUGMENTIN) 875-125 MG tablet Take 1 tablet by mouth every 12 (twelve) hours. 09/08/17   Janace Aris, NP  BD PEN NEEDLE NANO U/F 32G X 4 MM MISC USE AS DIRECTED WITH BASAGLAR 03/14/17   Romero Belling, MD  insulin aspart (NOVOLOG FLEXPEN) 100 UNIT/ML FlexPen Inject 2 Units into the skin 3 (three) times daily with meals. 02/15/17   Romero Belling, MD  Insulin Glargine [facility] KWIKPEN) 100 UNIT/ML SOPN Inject 0.16 mLs (16 Units total) into the skin every morning. 03/06/18   Romero Belling, MD  metoCLOPramide (REGLAN) 5 MG tablet Take 1 tablet (5 mg total) by mouth 3 (three) times daily before meals. 02/15/17   Romero Belling, MD  pantoprazole (PROTONIX) 40 MG tablet Take 40 mg by mouth two times a day 08/29/16   Marguerita Merles Latif, DO  sertraline (ZOLOFT) 50 MG tablet Take one tablet daily for one week and then increase to two tablets daily. Patient taking differently: Take 100 mg by mouth daily.  02/23/17   Mliss Sax, MD    Physical Exam: Vitals:   03/11/18 0954 03/11/18 1236  BP: 126/75 118/75  Pulse: 78 71  Resp: 20 18  Temp: 98.3 F (36.8 C) (!) 97.3 F (36.3 C)  TempSrc: Oral Oral  SpO2:  98%  Weight: 70.2 kg   Height: 5\' 6"  (1.676 m)      . General:  Appears calm and comfortable and is NAD . Eyes:   EOMI, normal lids, iris . ENT:  grossly normal hearing, lips & tongue, mmm; appropriate dentition . Neck:  no LAD, masses or thyromegaly . Cardiovascular:  RRR, no m/r/g. No LE edema.  Marland Kitchen Respiratory:   CTA bilaterally with no wheezes/rales/rhonchi.  Normal respiratory  effort. . Abdomen:  soft, NT, ND, NABS - note that he has well-defined abdominal wall musculature, making it difficult to perform an effective abdominal exam . Skin:  no rash or induration seen on limited exam . Musculoskeletal:  grossly normal tone BUE/BLE, good ROM, no bony abnormality . Psychiatric:  grossly normal mood and affect, speech fluent and appropriate, AOx3 . Neurologic:  CN 2-12 grossly intact, moves all extremities in coordinated fashion, sensation intact    Radiological Exams on Admission: No results found.  EKG: not done   Labs on Admission: I have personally reviewed the available labs and imaging studies at the time of the admission.  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 Russella Dar -Clears today, NPO after MN - D5 1/2 NS at 50 mL/hr since he has been hypoglycemic since arrival while NPO - Started IV pantoprazole drip at [facility] and this has been continued - Zofran IV for nausea - Avoid NSAIDs and SQ heparin - Maintain IV access (2 large bore IVs if possible). - Monitor closely for recurrent bleeding and follow cbc, transfuse as necessary. -If he has recurrent bleeding overnight, GI needs to be notified. -The patient has clearly voiced his desire for d/c to  home tomorrow following the procedure.  Type 1 DM -A1c was 7.8 on 1/23 -He uses 16 units of glargine insulin each morning -He has had well-controlled sugars since arrival with hypoglycemia to 60 x 1 -Will continue D5 1/2 NS at 50 cc/hour  -Will cover with moderate-scale SSI; he may need this once he is taking clears - but he will be NPO again tonight -Will give AM dose of insulin tomorrow, pending ongoing glucose control -He is followed by endocrinology and would like to transition to an insulin pump in the future  CKD -Stage 2 as of now, which appears to be stable for him -Encourage ongoing good DM control to prevent long-term sequelae of DM including ESRD    DVT prophylaxis: SCDs Code Status:  Full - confirmed with patient/family Family Communication: Sisters present throughout evaluation  Disposition Plan:  Home once clinically improved Consults called: GI  Admission status: It is my clinical opinion that referral for OBSERVATION is reasonable and necessary in this patient based on the above information provided. The aforementioned taken together are felt to place the patient at high risk for further clinical deterioration. However it is anticipated that the patient may be medically stable for discharge from the hospital within 24 to 48 hours.     Jonah Blue MD [facility]   How to contact the [facility] Attending or Consulting provider 7A - 7P or covering provider during after hours 7P -7A, for this patient?  1. Check the care team in [facility] and look for a) attending/consulting [facility] provider listed and b) the [facility] team listed 2. Log into [email or web address] and use [place]'s universal password to access. If you do not have the password, please contact the hospital operator. 3. Locate the [facility] provider you are looking for under [facility] and page to a number that you can be directly reached. 4. If you still have difficulty reaching the  provider, please page the [facility] (Director  on Call) for the Hospitalists listed on amion for assistance.   03/11/2018, 2:16 PM

## 2018-03-11 NOTE — Consult Note (Addendum)
Consultation  Referring Provider: Dr. Ophelia Charter     Primary Care Physician:  Mliss Sax, MD Primary Gastroenterologist: Dr. Lavon Paganini       Reason for Consultation: Hematemesis, abdominal pain            HPI:   Tyler [name] is a 33 y.o. male with a past medical history as listed below including anxiety, depression and previous episodes of hematemesis, who transferred from [facility] today with a complaint of hematemesis.    Today, patient explains that he has been having some epigastric abdominal pain for about 1 to 2 months, as he ran out of his Protonix about a month or so ago, last night he started throwing up bright red blood while at work around 3:00 in the morning.  Describes just one episode of hematemesis..  Tells me the pain change some starting in his right upper quadrant and then seeming to radiate throughout his abdomen.  Yesterday was a 10/10, today a 7-8/10.  Associated symptoms include a "bad headache" and some dizziness.  Was having some breakthrough heartburn or reflux.    Denies fever, chills, weight loss, anorexia or melena.  [facility] ED course: CTA of the abdomen and pelvis with abnormal focus of enhancement in the mid jejunal region anterior to the left kidney, only in the arterial phase phase, no persistent contrast in the venous phase, question angiodysplasia in the jejunum, active GI bleed was not confirmed on that study  GI history: 08/27/2016 Dr. Lavon Paganini EGD: Normal 09/08/2016 office visit with me: At that time doing well on pantoprazole twice daily  Past Medical History:  Diagnosis Date  . Anxiety   . Depression   . Diabetic peripheral neuropathy (HCC)   . Hematemesis/vomiting blood admissions 09/2013, 01/02/2014  . Proteinuria   . PTSD (post-traumatic stress disorder)   . Stomach ulcer   . Type I diabetes mellitus (HCC)     Past Surgical History:  Procedure Laterality Date  . APPENDECTOMY    . ESOPHAGOGASTRODUODENOSCOPY N/A  01/03/2014   Procedure: ESOPHAGOGASTRODUODENOSCOPY (EGD);  Surgeon: Barrie Folk, MD;  Location: [facility] ENDOSCOPY;  Service: Endoscopy;  Laterality: N/A;  . ESOPHAGOGASTRODUODENOSCOPY N/A 08/27/2016   Procedure: EGD with possible interventions;  Surgeon: Napoleon Form, MD;  Location: [facility] ENDOSCOPY;  Service: Endoscopy;  Laterality: N/A;    Family History  Problem Relation Age of Onset  . HIV/AIDS Father   . Drug abuse Father   . Drug abuse Sister   . Diabetes Cousin   . Suicidality Neg Hx   . Bipolar disorder Neg Hx   . Depression Neg Hx   . Anxiety disorder Neg Hx     Social History   Tobacco Use  . Smoking status: Never Smoker  . Smokeless tobacco: Never Used  Substance Use Topics  . Alcohol use: No  . Drug use: No    Prior to Admission medications   Medication Sig Start Date End Date Taking? Authorizing Provider  acetaminophen (TYLENOL) 325 MG tablet Take 2 tablets (650 mg total) by mouth every 6 (six) hours as needed for mild pain (or Fever >/= 101). 09/08/17   Janace Aris, NP  BD PEN NEEDLE NANO U/F 32G X 4 MM MISC USE AS DIRECTED WITH BASAGLAR 03/14/17   Romero Belling, MD  insulin aspart (NOVOLOG FLEXPEN) 100 UNIT/ML FlexPen Inject 2 Units into the skin 3 (three) times daily with meals. 02/15/17   Romero Belling, MD  Insulin Glargine [facility]) 100 UNIT/ML  SOPN Inject 0.16 mLs (16 Units total) into the skin every morning. 03/06/18   Ellison, Sean, MD  metoCLOPramide (REGLAN) 5 MG tablet Take 1 tablet (5 mg total) by mouth 3 (three) times daily before meals. 02/15/17   Ellison, Sean, MD  pantoprazole (PROTONIX) 40 MG tablet Take 40 mg by mouth two times a day 08/29/16   Sheikh, Omair Latif, DO  sertraline (ZOLOFT) 50 MG tablet Take one tablet daily for one week and then increase to two tablets daily. Patient taking differently: Take 100 mg by mouth daily.  02/23/17   Kremer, William Alfred, MD    Current Facility-Administered Medications  Medication Dose Route Frequency  Provider Last Rate Last Dose  . acetaminophen (TYLENOL) tablet 650 mg  650 mg Oral Q6H PRN Yates, Aleia Larocca, MD   650 mg at 03/11/18 1151   Or  . acetaminophen (TYLENOL) suppository 650 mg  650 mg Rectal Q6H PRN Yates, Tyger Wichman, MD      . [START ON 03/12/2018] BASAGLAR KWIKPEN KwikPen 16 Units  16 Units Subcutaneous BH-q7a Yates, Brandell Maready, MD      . dextrose 5 %-0.45 % sodium chloride infusion   Intravenous Continuous Yates, Kristapher [place], MD      . insulin aspart (novoLOG) injection 0-15 Units  0-15 Units Subcutaneous TID WC Yates, Jailey Booton, MD      . ondansetron (ZOFRAN) tablet 4 mg  4 mg Oral Q6H PRN Yates, Ashawnti Tangen, MD   4 mg at 03/11/18 1151   Or  . ondansetron (ZOFRAN) injection 4 mg  4 mg Intravenous Q6H PRN Yates, Caleen Taaffe, MD      . [START ON 03/12/2018] sertraline (ZOLOFT) tablet 100 mg  100 mg Oral Daily Yates, Nyilah Kight, MD        Allergies as of 03/11/2018 - Review Complete 03/11/2018  Allergen Reaction Noted  . 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 Food Itching and Swelling 09/05/2015  . Ibuprofen Other (See Comments) 09/28/2011  . Nsaids Other (See Comments) 08/27/2016     Review of Systems:    Constitutional: No weight loss, fever or chills Skin: No rash  Cardiovascular: No chest pain Respiratory: No SOB Gastrointestinal: See HPI and otherwise negative Genitourinary: No dysuria  Neurological: No headache, dizziness or syncope Musculoskeletal: No new muscle or joint pain Hematologic: [place] Psychiatric: No history of depression or anxiety     Physical Exam:  Vital signs in last 24 hours: unavailable   General:   Pleasant AA male appears to be in NAD, Well developed, Well nourished, alert and cooperative Head:  Normocephalic and atraumatic. Eyes:   PEERL, EOMI. No icterus. Conjunctiva pink. Ears:  Normal auditory acuity. Neck:  Supple Throat: Oral cavity and pharynx without inflammation, swelling or lesion. Lungs: Respirations even and unlabored. Lungs clear to  auscultation bilaterally.   No wheezes, crackles, or rhonchi.  Heart: Normal S1, S2. No MRG. Regular rate and rhythm. No peripheral edema, cyanosis or pallor.  Abdomen:  Soft, nondistended, marked right upper quadrant TTP with involuntary guarding. Normal bowel sounds. No appreciable masses or hepatomegaly. Rectal:  Not performed.  Msk:  Symmetrical without gross deformities. Peripheral pulses intact.  Extremities:  Without edema, no deformity or joint abnormality.  Neurologic:  Alert and  oriented x4;  grossly normal neurologically.  Skin:   Dry and intact without significant lesions or rashes. Psychiatric: Demonstrates good judgement and reason without abnormal affect or behaviors.   See paper records for work-up at [facility].   Impression / Plan:   Impression: 1.  Abdominal   pain: Continues currently in the right upper quadrant, has been off of Protonix for 2 months; Consider PUD vs gastritis vs other 2.  Hematemesis: 1 episode around 3:00 in the morning, none since, previous history of similar episodes, last in 2018 and previously 2015 with normal EGDs, recent CTA with question of enhancement in the jejunum  Plan: 1.  If he has further signs of acute GI bleed then would recommend that you call our service. 2.  Continue IV PPI twice daily 3.  Will discuss CTA with Dr. Adela Lank. 4.  Patient has had EGDs on 2 previous occasions for hematemesis with no findings.  The last in 2018.  Scheduled patient for an enteroscopy given abnormal CTA with Dr. Russella Dar tomorrow.  Did discuss risks, benefits, limitations and alternatives and the patient agrees to proceed. 5.  Patient to be on a clear liquid diet today and n.p.o. after midnight. 6.  Please await any further recommendations with Dr. Adela Lank later today.  Thank you for your kind consultation, we will continue to follow.  Violet Baldy [facility]  03/11/2018, 11:56 AM

## 2018-03-11 NOTE — Progress Notes (Signed)
Hypoglycemic Event  CBG: 60  Treatment: dextrose 50% 40ml Symptoms: patient said he felt weak   Follow-up CBG: Time: 1305 CBG Result: 113  Possible Reasons for Event: patient is NPO Comments/MD notified:MD Yates notified    Murphy Bundick  [place]

## 2018-03-11 NOTE — Progress Notes (Signed)
Received patient from EMS, he is in bed complaining of a pain of 6/10 in his abdominal area. Admitting paged and waiting for orders to get the patient some pain medicine.

## 2018-03-12 ENCOUNTER — Observation Stay (HOSPITAL_COMMUNITY): Payer: BLUE CROSS/BLUE SHIELD | Admitting: Anesthesiology

## 2018-03-12 ENCOUNTER — Encounter: Payer: Self-pay | Admitting: Physician Assistant

## 2018-03-12 ENCOUNTER — Encounter (HOSPITAL_COMMUNITY): Payer: Self-pay

## 2018-03-12 ENCOUNTER — Encounter (HOSPITAL_COMMUNITY)
Admission: AD | Disposition: A | Discharge status: Home / Self Care | Payer: Self-pay | Source: Other Acute Inpatient Hospital | Attending: Internal Medicine

## 2018-03-12 DIAGNOSIS — K920 Hematemesis: Secondary | ICD-10-CM | POA: Diagnosis not present

## 2018-03-12 DIAGNOSIS — E162 Hypoglycemia, unspecified: Secondary | ICD-10-CM | POA: Diagnosis not present

## 2018-03-12 DIAGNOSIS — K3189 Other diseases of stomach and duodenum: Secondary | ICD-10-CM | POA: Diagnosis not present

## 2018-03-12 HISTORY — PX: BIOPSY: SHX5522

## 2018-03-12 HISTORY — PX: HOT HEMOSTASIS: SHX5433

## 2018-03-12 HISTORY — PX: ESOPHAGOGASTRODUODENOSCOPY (EGD) WITH PROPOFOL: SHX5813

## 2018-03-12 LAB — GLUCOSE, CAPILLARY
GLUCOSE-CAPILLARY: 119 mg/dL — AB (ref 70–99)
GLUCOSE-CAPILLARY: 72 mg/dL (ref 70–99)
Glucose-Capillary: 107 mg/dL — ABNORMAL HIGH (ref 70–99)
Glucose-Capillary: 119 mg/dL — ABNORMAL HIGH (ref 70–99)
Glucose-Capillary: 127 mg/dL — ABNORMAL HIGH (ref 70–99)
Glucose-Capillary: 131 mg/dL — ABNORMAL HIGH (ref 70–99)
Glucose-Capillary: 135 mg/dL — ABNORMAL HIGH (ref 70–99)
Glucose-Capillary: 144 mg/dL — ABNORMAL HIGH (ref 70–99)
Glucose-Capillary: 146 mg/dL — ABNORMAL HIGH (ref 70–99)
Glucose-Capillary: 149 mg/dL — ABNORMAL HIGH (ref 70–99)
Glucose-Capillary: 164 mg/dL — ABNORMAL HIGH (ref 70–99)
Glucose-Capillary: 216 mg/dL — ABNORMAL HIGH (ref 70–99)
Glucose-Capillary: 255 mg/dL — ABNORMAL HIGH (ref 70–99)
Glucose-Capillary: 41 mg/dL — CL (ref 70–99)
Glucose-Capillary: 93 mg/dL (ref 70–99)

## 2018-03-12 LAB — BASIC METABOLIC PANEL
Anion gap: 7 (ref 5–15)
BUN: 7 mg/dL (ref 6–20)
CHLORIDE: 103 mmol/L (ref 98–111)
CO2: 28 mmol/L (ref 22–32)
Calcium: 8.6 mg/dL — ABNORMAL LOW (ref 8.9–10.3)
Creatinine, Ser: 1.45 mg/dL — ABNORMAL HIGH (ref 0.61–1.24)
GFR calc Af Amer: >60 mL/min (ref >60)
GFR calc non Af Amer: >60 mL/min (ref >60)
Glucose, Bld: 149 mg/dL — ABNORMAL HIGH (ref 70–99)
Potassium: 3.9 mmol/L (ref 3.5–5.1)
Sodium: 138 mmol/L (ref 135–145)

## 2018-03-12 LAB — CBC
HEMATOCRIT: 42.3 % (ref 39.0–52.0)
Hemoglobin: 13.9 g/dL (ref 13.0–17.0)
MCH: 27.7 pg (ref 26.0–34.0)
MCHC: 32.9 g/dL (ref 30.0–36.0)
MCV: 84.3 fL (ref 80.0–100.0)
Platelets: 116 10*3/uL — ABNORMAL LOW (ref 150–400)
RBC: 5.02 MIL/uL (ref 4.22–5.81)
RDW: 12.4 % (ref 11.5–15.5)
WBC: 3.3 10*3/uL — ABNORMAL LOW (ref 4.0–10.5)
nRBC: 0 % (ref 0.0–0.2)

## 2018-03-12 LAB — HEMOGLOBIN A1C
Hgb A1c MFr Bld: 8.5 % — ABNORMAL HIGH (ref 4.8–5.6)
Mean Plasma Glucose: 197 mg/dL

## 2018-03-12 LAB — PROTIME-INR
INR: 1.23
Prothrombin Time: 15.4 seconds — ABNORMAL HIGH (ref 11.4–15.2)

## 2018-03-12 LAB — APTT: aPTT: 32 seconds (ref 24–36)

## 2018-03-12 LAB — HIV ANTIBODY (ROUTINE TESTING W REFLEX): HIV Screen 4th Generation wRfx: NONREACTIVE

## 2018-03-12 SURGERY — ESOPHAGOGASTRODUODENOSCOPY (EGD) WITH PROPOFOL
Anesthesia: Monitor Anesthesia Care

## 2018-03-12 MED ORDER — PROPOFOL 10 MG/ML IV BOLUS
INTRAVENOUS | Status: DC | PRN
  Administered 2018-03-12: 20 mg via INTRAVENOUS
  Administered 2018-03-12: 30 mg via INTRAVENOUS

## 2018-03-12 MED ORDER — LACTATED RINGERS IV SOLN
INTRAVENOUS | Status: DC | PRN
  Administered 2018-03-12: 09:00:00 via INTRAVENOUS

## 2018-03-12 MED ORDER — PANTOPRAZOLE SODIUM 40 MG PO TBEC: DELAYED_RELEASE_TABLET | ORAL | 0 refills | Status: AC

## 2018-03-12 MED ORDER — SODIUM CHLORIDE 0.9 % IV SOLN
INTRAVENOUS | Status: DC
  Administered 2018-03-12: 19:00:00 via INTRAVENOUS

## 2018-03-12 MED ORDER — PHENYLEPHRINE HCL 10 MG/ML IJ SOLN
INTRAMUSCULAR | Status: DC | PRN
  Administered 2018-03-12: 80 ug via INTRAVENOUS

## 2018-03-12 MED ORDER — PROPOFOL 500 MG/50ML IV EMUL
INTRAVENOUS | Status: DC | PRN
  Administered 2018-03-12: 100 ug/kg/min via INTRAVENOUS

## 2018-03-12 MED ORDER — DEXMEDETOMIDINE HCL 200 MCG/2ML IV SOLN
INTRAVENOUS | Status: DC | PRN
  Administered 2018-03-12 (×2): 8 ug via INTRAVENOUS

## 2018-03-12 MED ORDER — ONDANSETRON HCL 4 MG/2ML IJ SOLN
INTRAMUSCULAR | Status: DC | PRN
  Administered 2018-03-12: 4 mg via INTRAVENOUS

## 2018-03-12 MED ORDER — LIDOCAINE HCL (CARDIAC) PF 100 MG/5ML IV SOSY
PREFILLED_SYRINGE | INTRAVENOUS | Status: DC | PRN
  Administered 2018-03-12: 60 mg via INTRATRACHEAL

## 2018-03-12 NOTE — Progress Notes (Addendum)
Patient said he is not signing AMA until he talks to the doctor. MD paged and patient is now talking in my phone with him ( MD Kc).

## 2018-03-12 NOTE — Anesthesia Preprocedure Evaluation (Addendum)
Anesthesia Evaluation  Patient identified by MRN, date of birth, ID band Patient awake    Reviewed: Allergy & Precautions, H&P , NPO status , Patient's Chart, lab work & pertinent test results  Airway Mallampati: II  TM Distance: >3 FB Neck ROM: Full    Dental no notable dental hx. (+) Teeth Intact, Dental Advisory Given   Pulmonary neg pulmonary ROS,    Pulmonary exam normal breath sounds clear to auscultation       Cardiovascular negative cardio ROS   Rhythm:Regular Rate:Normal     Neuro/Psych Anxiety Depression negative neurological ROS     GI/Hepatic Neg liver ROS, PUD,   Endo/Other  diabetes, Type 1, Insulin Dependent  Renal/GU Renal InsufficiencyRenal disease  negative genitourinary   Musculoskeletal   Abdominal   Peds  Hematology negative hematology ROS (+)   Anesthesia Other Findings   Reproductive/Obstetrics negative OB ROS                            Anesthesia Physical Anesthesia Plan  ASA: III  Anesthesia Plan: MAC   Post-op Pain Management:    Induction: Intravenous  PONV Risk Score and Plan: 1 and Propofol infusion and Ondansetron  Airway Management Planned: Nasal Cannula  Additional Equipment:   Intra-op Plan:   Post-operative Plan:   Informed Consent: I have reviewed the patients History and Physical, chart, labs and discussed the procedure including the risks, benefits and alternatives for the proposed anesthesia with the patient or authorized representative who has indicated his/her understanding and acceptance.     Dental advisory given  Plan Discussed with: CRNA  Anesthesia Plan Comments:         Anesthesia Quick Evaluation

## 2018-03-12 NOTE — Progress Notes (Signed)
PROGRESS NOTE    Tyler [name]  MNO:[id number] DOB: 03-10-1985 DOA: 03/11/2018 PCP: Mliss Sax, MD   Brief Narrative: 33 y.o. male with medical history significant of type 1 DM; stomach ulcer with multiple admissions for hematemesis; and PTSD with depression and anxiety presenting with hematemesis.  He has bene having abdominal pain for a while - ran out of Protonix and couldn't get anymore.  Last night, he started throwing up blood.  He has had mild abdominal for a month or two, but it was bearable.  He ran out of Protonix months ago.  He was at work overnight and he began having hematemesis while at work.  His abdominal pain worsened about an hour before it happened.  It started in the RUQ and then it became diffuse.  He had a bad headache and got a little dizzy.  He had 2 episodes of hematemesis, at the same time, and has not had further vomiting since.  He is not on an insulin pump.  His sugar usually runs 120-150.  Recent high was 170.  His last A1c was 3 months ago and he is not sure what it was.  4/18 EGD by Dr. Lavon Paganini was normal. ED Course: Started on Protonix drip at [facility] and transferred to [facility]  Brief/Interim Summary: Patient was admitted, seen by gastroenterology underwent EGD EGD showed diffuse mildly erythematous mucosa in the gastric fundus and body, several areas with spontaneous mild bleeding in the fundus no apparent AVM or other lesion coagulation for hemostasis using argon plasma was obtained however several areas with very minimal oozing noted and biopsies were taken. Patient started on diet post procedure.  Subjective Seen after EGD. patient is adamant on going home today. But his sugar is low. Denies abdominal pain, nausea , vomiting.  Assessment/Plan  Hematemesis:  Unclear etiology. Pt has had similar episodes in the past. S/P EGD diffuse mildly erythematous mucosa in the gastric fundus and body, several areas with spontaneous mild bleeding in the fundus no  apparent AVM or other lesion, s/p coagulation for hemostasis using argon plasma, however several areas with very minimal oozing noted and biopsies were taken. Cont PPI BID. Avoid nsaids. allwo full liquid diet and advance slowly as tolerated. Ptt/inr normal.  Hypoglycemia from n.p.o and Lantus dose. patient is started on diet and given juice blood sugar improved in 169s nit drifting down to 93. He is off dextrose ivf. Cont to monitor accucheck  CKD stage II. Creat around 1.3 and today at 1.4.  Patient is aware about his diagnosis, instructed to follow-up with PCP with BMP in 1 week which is elevated.  Diabetes A1c 7.8 in Jan 20 patient is on insulin glargine at home.  Given his hypoglycemia-hold off on insulin for now and monitor accu-check. He is followed by endocrinology and he would like to transition to insulin pump in the future.   EGD Procedure: Diffuse mildly erythematous mucosa was found in the gastric fundus and in the gastric body. Several areas with spotaneous mild bleeding in the fundus. No apparent AVM, Dieulafoy lesion, etc. Coagulation for hemostasis using argon plasma was successful for the largest area bleeding however several areas with very minimal oozing noted, all without contact of the mucosa. Biopsies were taken with a cold forceps for histology in the body, away from the most friable mucosa in the fundus. The exam of the stomach was otherwise normal. The duodenal bulb, second portion of the duodenum and third portion of the duodenum were normal. -  Normal esophagus. - Erythematous mucosa in the gastric fundus and gastric body. Treated with argon plasma coagulation (APC). R/O gastritis. Biopsied. - Normal duodenal bulb, second portion of the duodenum and third portion of the duodenum. Impression: - Return patient to hospital ward for ongoing care. - Full liquid diet today. - Continue present medications including pantoprazole 40 mg po bid. - No aspirin, ibuprofen,  naproxen, or other non-steroidal anti-inflammatory drugs. - Await pathology results. - Check PT/INR, PT and consider further evaluation of platelet function. - Return to GI office in 1 month with Dr. Lavon PaganiniNandigam"   DVT prophylaxis:SCD Code Status: FULL Family Communication: family at bedside  Disposition Plan: monitor overnight due to dropping sugar. If sugar starts to drop further- start ivf w dextrose, monitor accu-check q 1 hrx3 then q 2 hr. Patient is advised not to leave against medical advice. Discussed w RN, advised  risk of leaving AMA including further hypoglycemia that could lead more problems including but not limited to coma, death..  Consultants:  GI Procedures: egd- AS ABOVE.  Antimicrobials: Anti-infectives (From admission, onward)   None       Objective: Vitals:   03/12/18 1014 03/12/18 1041 03/12/18 1220 03/12/18 1610  BP: 121/82 131/88 129/80 122/82  Pulse: 61 (!) 56  73  Resp: 16 18 18 18   Temp: 97.6 F (36.4 C) 97.6 F (36.4 C) 97.6 F (36.4 C) 97.7 F (36.5 C)  TempSrc: Oral Oral Oral Oral  SpO2: 98% 100% 98% 99%  Weight:      Height:        Intake/Output Summary (Last 24 hours) at 03/12/2018 1652 Last data filed at 03/12/2018 1400 Gross per 24 hour  Intake 1874.05 ml  Output 1835 ml  Net 39.05 ml   Filed Weights   03/11/18 0954 03/12/18 0408  Weight: 70.2 kg 70.6 kg   Weight change:   Body mass index is 25.11 kg/m.  Intake/Output from previous day: 02/16 0701 - 02/17 0700 In: 1594.3 [P.O.:240; I.V.:1354.3] Out: 1810 [Urine:1810] Intake/Output this shift: Total I/O In: 457.2 [I.V.:457.2] Out: 25 [Blood:25]  Examination:  General exam: Appears calm and comfortable,Not in distress, older fore the age, well built muscular HEENT:PERRL,Oral mucosa moist, Ear/Nose normal on gross exam Respiratory system: Bilateral equal air entry, normal vesicular breath sounds, no wheezes or crackles  Cardiovascular system: S1 & S2 heard,No JVD,  murmurs. Gastrointestinal system: Abdomen is  soft, non tender, non distended, BS +  Nervous System:Alert and oriented. No focal neurological deficits/moving extremities, sensation intact. Extremities: No edema, no clubbing, distal peripheral pulses palpable. Skin: No rashes, lesions, no icterus MSK: Normal muscle bulk,tone ,power  Medications:  Scheduled Meds: . insulin aspart  0-15 Units Subcutaneous TID WC  . [START ON 03/15/2018] pantoprazole  40 mg Intravenous Q12H   Continuous Infusions: . pantoprozole (PROTONIX) infusion 8 mg/hr (03/12/18 1027)    Data Reviewed: I have personally reviewed following labs and imaging studies  CBC: Recent Labs  Lab 03/11/18 1123 03/12/18 0534  WBC 3.8* 3.3*  NEUTROABS 1.9  --   HGB 13.7 13.9  HCT 42.9 42.3  MCV 83.3 84.3  PLT 125* 116*   Basic Metabolic Panel: Recent Labs  Lab 03/11/18 1123 03/12/18 0534  NA 139 138  K 3.9 3.9  CL 103 103  CO2 26 28  GLUCOSE 88 149*  BUN 11 7  CREATININE 1.34* 1.45*  CALCIUM 9.1 8.6*   GFR: Estimated Creatinine Clearance: 64.8 mL/min (A) (by C-G formula based on SCr of 1.45 mg/dL (  H)). Liver Function Tests: Recent Labs  Lab 03/11/18 1123  AST 28  ALT 18  ALKPHOS 50  BILITOT 1.2  PROT 6.9  ALBUMIN 3.7   No results for input(s): LIPASE, AMYLASE in the last 168 hours. No results for input(s): AMMONIA in the last 168 hours. Coagulation Profile: Recent Labs  Lab 03/12/18 1032  INR 1.23   Cardiac Enzymes: No results for input(s): CKTOTAL, CKMB, CKMBINDEX, TROPONINI in the last 168 hours. BNP (last 3 results) No results for input(s): PROBNP in the last 8760 hours. HbA1C: Recent Labs    03/11/18 1123  HGBA1C 8.5*   CBG: Recent Labs  Lab 03/12/18 1258 03/12/18 1404 03/12/18 1455 03/12/18 1539 03/12/18 1645  GLUCAP 107* 164* 131* 119* 93   Lipid Profile: No results for input(s): CHOL, HDL, LDLCALC, TRIG, CHOLHDL, LDLDIRECT in the last 72 hours. Thyroid Function  Tests: No results for input(s): TSH, T4TOTAL, FREET4, T3FREE, THYROIDAB in the last 72 hours. Anemia Panel: No results for input(s): VITAMINB12, FOLATE, FERRITIN, TIBC, IRON, RETICCTPCT in the last 72 hours. Sepsis Labs: No results for input(s): PROCALCITON, LATICACIDVEN in the last 168 hours.  No results found for this or any previous visit (from the past 240 hour(s)).    Radiology Studies: No results found.    LOS: 1 day   Time spent: More than 50% of that time was spent in counseling and/or coordination of care.  Lanae Boast, MD [facility]  03/12/2018, 4:52 PM

## 2018-03-12 NOTE — Progress Notes (Addendum)
Discharge instructions given to patient, all question answered and concerns addressed. Patient will not be going home until blood glucose stabilizes.

## 2018-03-12 NOTE — Interval H&P Note (Signed)
History and Physical Interval Note:  03/12/2018 8:40 AM  Tyler [name]  has presented today for surgery, with the diagnosis of Hematemesis  The various methods of treatment have been discussed with the patient and family. After consideration of risks, benefits and other options for treatment, the patient has consented to  Procedure(s): ENTEROSCOPY (N/A) as a surgical intervention .  The patient's history has been reviewed, patient examined, no change in status, stable for surgery.  I have reviewed the patient's chart and labs.  Questions were answered to the patient's satisfaction.     Venita Lick. Russella Dar

## 2018-03-12 NOTE — Op Note (Addendum)
[facility] Patient Name: Tyler [name] Procedure Date : 03/12/2018 MRN: [id number] Attending MD: Meryl Dare , MD Date of Birth: 1985-06-02 CSN: [id number] Age: 33 Admit Type: Inpatient Procedure:                Upper GI endoscopy Indications:              Hematemesis Providers:                Venita Lick. Russella Dar, MD, Leslie Dales, RN, Roselie Awkward, RN, Verita Schneiders, Technician, Janann Colonel CRNA Referring MD:             [facility] Medicines:                Monitored Anesthesia Care Complications:            No immediate complications. Estimated Blood Loss:     Estimated blood loss was minimal. Procedure:                Pre-Anesthesia Assessment:                           - Prior to the procedure, a History and Physical                            was performed, and patient medications and                            allergies were reviewed. The patient's tolerance of                            previous anesthesia was also reviewed. The risks                            and benefits of the procedure and the sedation                            options and risks were discussed with the patient.                            All questions were answered, and informed consent                            was obtained. Prior Anticoagulants: The patient has                            taken no previous anticoagulant or antiplatelet                            agents. ASA Grade Assessment: II - A patient with  mild systemic disease. After reviewing the risks                            and benefits, the patient was deemed in                            satisfactory condition to undergo the procedure.                           After obtaining informed consent, the endoscope was                            passed under direct vision. Throughout the                            procedure, the patient's  blood pressure, pulse, and                            oxygen saturations were monitored continuously. The                            GIF-H190 ([id number] Olympus gastroscope was                            introduced through the mouth and advanced to the                            third part of duodenum. After obtaining informed                            consent, the endoscope was passed under direct                            vision. Throughout the procedure, the patient's                            blood pressure, pulse, and oxygen saturations were                            monitored continuously.The upper GI endoscopy was                            accomplished without difficulty. The patient                            tolerated the procedure well. Scope In: Scope Out: Findings:      The examined esophagus was normal.      Diffuse mildly erythematous mucosa was found in the gastric fundus and       in the gastric body. Several areas with spotaneous mild bleeding in the       fundus. No apparent AVM, Dieulafoy lesion, etc. Coagulation for       hemostasis using argon plasma was successful for the largest area       bleeding however several areas with very minimal oozing  noted, all       without contact of the mucosa. Biopsies were taken with a cold forceps       for histology in the body, away from the most friable mucosa in the       fundus.      The exam of the stomach was otherwise normal.      The duodenal bulb, second portion of the duodenum and third portion of       the duodenum were normal. Impression:               - Normal esophagus.                           - Erythematous mucosa in the gastric fundus and                            gastric body. Treated with argon plasma coagulation                            (APC). R/O gastritis. Biopsied.                           - Normal duodenal bulb, second portion of the                            duodenum and third portion of the  duodenum. Recommendation:           - Return patient to hospital ward for ongoing care.                           - Full liquid diet today.                           - Continue present medications including                            pantoprazole 40 mg po bid.                           - No aspirin, ibuprofen, naproxen, or other                            non-steroidal anti-inflammatory drugs.                           - Await pathology results.                           - Check PT/INR, PT and consider further evaluation                            of platelet function.                           - Return to GI office in 1 month with Dr. Lavon Paganini. Procedure Code(s):        --- Professional ---  43255, 59, Esophagogastroduodenoscopy, flexible,                            transoral; with control of bleeding, any method                           43239, Esophagogastroduodenoscopy, flexible,                            transoral; with biopsy, single or multiple Diagnosis Code(s):        --- Professional ---                           K31.89, Other diseases of stomach and duodenum                           K92.0, Hematemesis CPT copyright 2018 [employer] Association. All rights reserved. The codes documented in this report are preliminary and upon coder review may  be revised to meet current compliance requirements. Meryl Dare, MD 03/12/2018 9:27:55 AM This report has been signed electronically. Number of Addenda: 0

## 2018-03-12 NOTE — Anesthesia Postprocedure Evaluation (Signed)
Anesthesia Post Note  Patient: Cope Marte  Procedure(s) Performed: ESOPHAGOGASTRODUODENOSCOPY (EGD) WITH PROPOFOL (N/A ) BIOPSY HOT HEMOSTASIS (ARGON PLASMA COAGULATION/BICAP) (N/A )     Patient location during evaluation: PACU Anesthesia Type: MAC Level of consciousness: awake and alert Pain management: pain level controlled Vital Signs Assessment: post-procedure vital signs reviewed and stable Respiratory status: spontaneous breathing, nonlabored ventilation and respiratory function stable Cardiovascular status: stable and blood pressure returned to baseline Postop Assessment: no apparent nausea or vomiting Anesthetic complications: no    Last Vitals:  Vitals:   03/12/18 0940 03/12/18 0945  BP: (!) 93/49 (!) 89/56  Pulse: 72 75  Resp:    Temp:    SpO2: 99% 99%    Last Pain:  Vitals:   03/12/18 0945  TempSrc:   PainSc: 0-No pain                 Isaiah Cianci,W. EDMOND

## 2018-03-12 NOTE — Transfer of Care (Signed)
Immediate Anesthesia Transfer of Care Note  Patient: Tyler [name]  Procedure(s) Performed: ESOPHAGOGASTRODUODENOSCOPY (EGD) WITH PROPOFOL (N/A ) BIOPSY HOT HEMOSTASIS (ARGON PLASMA COAGULATION/BICAP) (N/A )  Patient Location: Short Stay  Anesthesia Type:MAC  Level of Consciousness: drowsy and patient cooperative  Airway & Oxygen Therapy: Patient Spontanous Breathing and Patient connected to nasal cannula oxygen  Post-op Assessment: Report given to RN and Post -op Vital signs reviewed and stable  Post vital signs: Reviewed and stable  Last Vitals:  Vitals Value Taken Time  BP 82/68 03/12/2018  9:17 AM  Temp    Pulse 65 03/12/2018  9:17 AM  Resp 12 03/12/2018  9:17 AM  SpO2 99 % 03/12/2018  9:17 AM  Vitals shown include unvalidated device data.  Last Pain:  Vitals:   03/12/18 0915  TempSrc: Oral  PainSc: 0-No pain      Patients Stated Pain Goal: 0 (54/56/25 6389)  Complications: No apparent anesthesia complications

## 2018-03-12 NOTE — Anesthesia Procedure Notes (Signed)
Procedure Name: General with mask airway Performed by: Modena Morrow, CRNA Pre-anesthesia Checklist: Patient identified, Emergency Drugs available, Suction available, Patient being monitored and Timeout performed Patient Re-evaluated:Patient Re-evaluated prior to induction Oxygen Delivery Method: Simple face mask Preoxygenation: Pre-oxygenation with 100% oxygen Induction Type: IV induction Dental Injury: Teeth and Oropharynx as per pre-operative assessment

## 2018-03-12 NOTE — Progress Notes (Signed)
Hypoglycemic Event  CBG: 41  Treatment: FOUR CONTAINER OF JUICE  Symptoms: NONE PATIENT SAID HE FELT OKAY AND WAS ALERT AND ORIENTED.  Follow-up CBG: Time: 1258 CBG Result:106  Possible Reasons for Event: PATIENT BEING ON CLEAR LIQUID DIET.  Comments/MD notified: MD [facility] is aware, he said keep the patient until blood sugar stabilizes.     Tyler [name]  Mamane S Idi

## 2018-03-12 NOTE — Progress Notes (Addendum)
Patient said he will stay, until the morning and just wants to leave early in the morning. MD is aware. Patient's blood glucose is 72 at this time , I educated him about diet and trying to keep up with his intake.

## 2018-03-12 NOTE — Progress Notes (Signed)
Patient is very adamant to leave, patient educated about the consequences that low blood sugar can cause, but he said that he can not stay another night in the hospital he has to go. MD is aware and patient will sign AMA and leave.

## 2018-03-12 NOTE — Progress Notes (Signed)
Patient's blood sugar is being checked every hour after dextrose has been stopped, and it is dropping it was 164, then 131, 119 and now it is 93. MD is aware and reported that patient should be monitored and not be discharged at this time. Will continue to monitor.

## 2018-03-12 NOTE — Progress Notes (Signed)
Took the AMA form to the patient's room for him to sign but he said since we are nit giving him a doctor's note, he will not sign the AMA form and he will leave anyway. MD is aware.

## 2018-03-13 ENCOUNTER — Encounter: Payer: Self-pay | Admitting: Gastroenterology

## 2018-03-13 DIAGNOSIS — F329 Major depressive disorder, single episode, unspecified: Secondary | ICD-10-CM | POA: Diagnosis present

## 2018-03-13 DIAGNOSIS — K219 Gastro-esophageal reflux disease without esophagitis: Secondary | ICD-10-CM | POA: Diagnosis present

## 2018-03-13 DIAGNOSIS — Z91018 Allergy to other foods: Secondary | ICD-10-CM | POA: Diagnosis not present

## 2018-03-13 DIAGNOSIS — E10649 Type 1 diabetes mellitus with hypoglycemia without coma: Secondary | ICD-10-CM | POA: Diagnosis present

## 2018-03-13 DIAGNOSIS — Z886 Allergy status to analgesic agent status: Secondary | ICD-10-CM | POA: Diagnosis not present

## 2018-03-13 DIAGNOSIS — K922 Gastrointestinal hemorrhage, unspecified: Secondary | ICD-10-CM | POA: Diagnosis present

## 2018-03-13 DIAGNOSIS — Z794 Long term (current) use of insulin: Secondary | ICD-10-CM | POA: Diagnosis not present

## 2018-03-13 DIAGNOSIS — E1042 Type 1 diabetes mellitus with diabetic polyneuropathy: Secondary | ICD-10-CM | POA: Diagnosis present

## 2018-03-13 DIAGNOSIS — N182 Chronic kidney disease, stage 2 (mild): Secondary | ICD-10-CM | POA: Diagnosis present

## 2018-03-13 DIAGNOSIS — K920 Hematemesis: Secondary | ICD-10-CM | POA: Diagnosis present

## 2018-03-13 DIAGNOSIS — Z79899 Other long term (current) drug therapy: Secondary | ICD-10-CM | POA: Diagnosis not present

## 2018-03-13 DIAGNOSIS — F419 Anxiety disorder, unspecified: Secondary | ICD-10-CM | POA: Diagnosis present

## 2018-03-13 LAB — CBC
HCT: 43 % (ref 39.0–52.0)
Hemoglobin: 13.8 g/dL (ref 13.0–17.0)
MCH: 27.1 pg (ref 26.0–34.0)
MCHC: 32.1 g/dL (ref 30.0–36.0)
MCV: 84.3 fL (ref 80.0–100.0)
PLATELETS: 121 10*3/uL — AB (ref 150–400)
RBC: 5.1 MIL/uL (ref 4.22–5.81)
RDW: 12.2 % (ref 11.5–15.5)
WBC: 4.9 10*3/uL (ref 4.0–10.5)
nRBC: 0 % (ref 0.0–0.2)

## 2018-03-13 LAB — GLUCOSE, CAPILLARY
GLUCOSE-CAPILLARY: 159 mg/dL — AB (ref 70–99)
Glucose-Capillary: 131 mg/dL — ABNORMAL HIGH (ref 70–99)

## 2018-03-13 MED ORDER — PANTOPRAZOLE SODIUM 40 MG IV SOLR: 40.0000 mg | Freq: Two times a day (BID) | INTRAVENOUS | Status: DC

## 2018-03-13 NOTE — Progress Notes (Signed)
MD at bedside states pt will be discharged.

## 2018-03-13 NOTE — Progress Notes (Signed)
Page to MD to request encounter discharge reconciliation be completed, per discharge navigator it has not been done. Unable to print AVS

## 2018-03-13 NOTE — Discharge Summary (Signed)
Physician Discharge Summary  Tyler [name] ZOX:[id number] DOB: [birth year] DOA: 03/11/2018  PCP: Mliss SaxKremer, William Alfred, MD  Admit date: 03/11/2018 Discharge date: 03/13/2018  Admitted From: Home Disposition:  Home  Recommendations for Outpatient Follow-up:  1. Follow up with PCP in 1-2 weeks 2. Please obtain BMP/CBC in one week 3. Please follow up on the following pending results:  Home Health:Noe  Equipment/Devices:No  Discharge Condition:stable  CODE STATUS:full code  Diet recommendation: SOFT- then regular/diabetic as tolerated.  Brief/Interim Summary: 33 y.o.malewith medical history significant oftype 1 DM; stomach ulcer with multiple admissions for hematemesis; and PTSD with depression and anxiety presenting withhematemesis. He has bene having abdominal pain for a while - ran out of Protonix and couldn't get anymore. Last night, he started throwing up blood. He has had mild abdominal for a month or two, but it was bearable. He ran out of Protonix months ago. He was at work overnight and he began having hematemesis while at work. His abdominal pain worsened about an hour before it happened. It started in the RUQ and then it became diffuse. He had a bad headache and got a little dizzy. He had 2 episodes of hematemesis, at the same time, and has not had further vomiting since. He is not on an insulin pump. His sugar usually runs 120-150. Recent high was 170. His last A1c was 3 months ago and he is not sure what it was. 4/18 EGD by Dr. Lavon PaganiniNandigam was normal. ED Course:[facility]  Brief/Interim Summary: Patient was admitted, seen by gastroenterology underwent EGD EGD showed diffuse mildly erythematous mucosa in the gastric fundus and body, several areas with spontaneous mild bleeding in the fundus no apparent AVM or other lesion coagulation for hemostasis using argon plasma was obtained however several areas with very minimal oozing  noted and biopsies were taken. Patient started on diet post procedure. HE WAS Monitored overnight, hemoglobin remained stable.  Hypoglycemia resolved. Patient is being discharged home with instruction to follow-up with PCP and GI. I discussed with GI 2/17.  Assessment/Plan  Hematemesis:  Unclear etiology. Pt has had similar episodes in the past. S/P EGD diffuse mildly erythematous mucosa in the gastric fundus and body, several areas with spontaneous mild bleeding in the fundus no apparent AVM or other lesion, s/p coagulation for hemostasis using argon plasma, however several areas with very minimal oozing noted and biopsies were taken. Cont PPI BID. Avoid nsaids.  Continue diet as tolerated.  F/u w GI as o/p.  Hypoglycemia from n.p.o and Lantus dose.  Blood sugar has remained stable through the night.  Advised to follow-up on insulin if his blood sugar is less than 100, follow-up with PCP/endocrinology.  Monitor daily status glucose.    CKD stage II. Creat around 1.3 and today at 1.4.  Patient is aware about his diagnosis, instructed to follow-up with PCP with BMP in 1 week which is elevated.  Diabetes A1c 7.8 in Jan 20 patient is on insulin glargine at home.  Given his hypoglycemia-held off on insulin here and monitored accu-check. He is followed by endocrinology and he would like to transition to insulin pump in the future.  EGD Procedure: Diffuse mildly erythematous mucosa was found in the gastric fundus and in the gastric body. Several areas with spotaneous mild bleeding in the fundus. No apparent AVM, Dieulafoy lesion, etc. Coagulation for hemostasis using argon plasma was successful for the largest area bleeding however several areas with very minimal oozing noted, all  without contact of the mucosa. Biopsies were taken with a cold forceps for histology in the body, away from the most friable mucosa in the fundus. The exam of the stomach was otherwise normal. The duodenal bulb, second  portion of the duodenum and third portion of the duodenum were normal. - Normal esophagus. - Erythematous mucosa in the gastric fundus and gastric body. Treated with argon plasma coagulation (APC). R/O gastritis. Biopsied. - Normal duodenal bulb, second portion of the duodenum and third portion of the duodenum. Impression: - Return patient to hospital ward for ongoing care. - Full liquid diet today. - Continue present medications including pantoprazole 40 mg po bid. - No aspirin, ibuprofen, naproxen, or other non-steroidal anti-inflammatory drugs. - Await pathology results. - Check PT/INR, PT and consider further evaluation of platelet function. - Return to GI office in 1 month with Dr. Lavon Paganini"  Discharge Diagnoses:  Principal Problem:   Hematemesis Active Problems:   Renal insufficiency, mild   Insulin dependent type 1 diabetes mellitus (HCC)   Pain of upper abdomen   GI bleed    Discharge Instructions  Discharge Instructions    Call MD for:   Complete by:  As directed    Please call call MD or return to ER for similar recurring problem, nausea/vomiting, uncontrolled pain, abdominal pain chest pain, shortness of breath. Please follow-up your GI doctor Nandigam- call office. Se PCP In 1 week for cbc/bmp check.  Please check blood sugar before meals at bedtime at home QACHS and do not use your insulin if sugar remains less than 100-140. Please avoid alcohol, smoking, NSAID/IBUPROFEN/MOTRIN.   Diet Carb Modified   Complete by:  As directed    SOFT diet AND ADVANCE AS TOLERATED   Increase activity slowly   Complete by:  As directed      Allergies as of 03/13/2018      Reactions   Banana Anaphylaxis   Food Itching, Swelling   Walnuts- THROAT ITCHES AND EYES SWELL   Ibuprofen Other (See Comments)   ULCERS   Nsaids Other (See Comments)   ULCERS      Medication List    TAKE these medications   acetaminophen 325 MG tablet Commonly known as:  TYLENOL Take 2  tablets (650 mg total) by mouth every 6 (six) hours as needed for mild pain (or Fever >/= 101).   BASAGLAR KWIKPEN 100 UNIT/ML Sopn Inject 0.16 mLs (16 Units total) into the skin every morning.   BD PEN NEEDLE NANO U/F 32G X 4 MM Misc Generic drug:  Insulin Pen Needle USE AS DIRECTED WITH BASAGLAR   insulin aspart 100 UNIT/ML FlexPen Commonly known as:  NOVOLOG FLEXPEN Inject 2 Units into the skin 3 (three) times daily with meals.   metoCLOPramide 5 MG tablet Commonly known as:  REGLAN Take 1 tablet (5 mg total) by mouth 3 (three) times daily before meals.   pantoprazole 40 MG tablet Commonly known as:  PROTONIX Take 40 mg by mouth two times a day      Follow-up Information    Napoleon Form, MD. Go on 03/21/2018.   Specialty:  Gastroenterology Why:  @9 :30am Contact information: [address] [place] [address] [phone]        Mliss Sax, MD. Go on 03/19/2018.   Specialty:  Family Medicine Why:  for cbc and bmp check@11 ;30am Contact information: [address] [place] [address] [phone]          Allergies  Allergen Reactions  .  Banana Anaphylaxis  . Food Itching and Swelling    Walnuts- THROAT ITCHES AND EYES SWELL  . Ibuprofen Other (See Comments)    ULCERS  . Nsaids Other (See Comments)    ULCERS     Consultations:  Gi   Procedures/Studies:  No results found.   Subjective: Resting, had BM, no nausea, vomitting.  Discharge Exam: Vitals:   03/12/18 1943 03/13/18 0546  BP: (!) 127/92 (!) 98/51  Pulse: 73 73  Resp: 20 18  Temp: 98.2 F (36.8 C) 98.4 F (36.9 C)  SpO2: 100% 99%   Vitals:   03/12/18 1220 03/12/18 1610 03/12/18 1943 03/13/18 0546  BP: 129/80 122/82 (!) 127/92 (!) 98/51  Pulse:  73 73 73  Resp: 18 18 20 18   Temp: 97.6 F (36.4 C) 97.7 F (36.5 C) 98.2 F (36.8 C) 98.4 F (36.9 C)  TempSrc: Oral Oral Oral Oral  SpO2: 98% 99% 100% 99%  Weight:    70.6 kg  Height:         General: Pt is alert, awake, not in acute distress Cardiovascular: RRR, S1/S2 +, no rubs, no gallops Respiratory: CTA bilaterally, no wheezing, no rhonchi Abdominal: Soft, NT, ND, bowel sounds + Extremities: no edema, no cyanosis   The results of significant diagnostics from this hospitalization (including imaging, microbiology, ancillary and laboratory) are listed below for reference.     Microbiology: No results found for this or any previous visit (from the past 240 hour(s)).   Labs: BNP (last 3 results) No results for input(s): BNP in the last 8760 hours. Basic Metabolic Panel: Recent Labs  Lab 03/11/18 1123 03/12/18 0534  NA 139 138  K 3.9 3.9  CL 103 103  CO2 26 28  GLUCOSE 88 149*  BUN 11 7  CREATININE 1.34* 1.45*  CALCIUM 9.1 8.6*   Liver Function Tests: Recent Labs  Lab 03/11/18 1123  AST 28  ALT 18  ALKPHOS 50  BILITOT 1.2  PROT 6.9  ALBUMIN 3.7   No results for input(s): LIPASE, AMYLASE in the last 168 hours. No results for input(s): AMMONIA in the last 168 hours. CBC: Recent Labs  Lab 03/11/18 1123 03/12/18 0534 03/13/18 0635  WBC 3.8* 3.3* 4.9  NEUTROABS 1.9  --   --   HGB 13.7 13.9 13.8  HCT 42.9 42.3 43.0  MCV 83.3 84.3 84.3  PLT 125* 116* 121*   Cardiac Enzymes: No results for input(s): CKTOTAL, CKMB, CKMBINDEX, TROPONINI in the last 168 hours. BNP: Invalid input(s): POCBNP CBG: Recent Labs  Lab 03/12/18 1921 03/12/18 2112 03/12/18 2319 03/13/18 0249 03/13/18 0611  GLUCAP 72 216* 255* 159* 131*   D-Dimer No results for input(s): DDIMER in the last 72 hours. Hgb A1c Recent Labs    03/11/18 1123  HGBA1C 8.5*   Lipid Profile No results for input(s): CHOL, HDL, LDLCALC, TRIG, CHOLHDL, LDLDIRECT in the last 72 hours. Thyroid function studies No results for input(s): TSH, T4TOTAL, T3FREE, THYROIDAB in the last 72 hours.  Invalid input(s): FREET3 Anemia work up No results for input(s): VITAMINB12, FOLATE, FERRITIN,  TIBC, IRON, RETICCTPCT in the last 72 hours. Urinalysis    Component Value Date/Time   COLORURINE YELLOW 02/23/2017 0943   APPEARANCEUR Sl Cloudy (A) 02/23/2017 0943   LABSPEC 1.025 02/23/2017 0943   PHURINE 6.0 02/23/2017 0943   GLUCOSEU >=1000 (A) 02/23/2017 0943   HGBUR NEGATIVE 02/23/2017 0943   BILIRUBINUR NEGATIVE 02/23/2017 0943   KETONESUR NEGATIVE 02/23/2017 0943   PROTEINUR NEGATIVE 01/02/2014  1340   UROBILINOGEN 0.2 02/23/2017 0943   NITRITE NEGATIVE 02/23/2017 0943   LEUKOCYTESUR NEGATIVE 02/23/2017 0943   Sepsis Labs Invalid input(s): PROCALCITONIN,  WBC,  LACTICIDVEN Microbiology No results found for this or any previous visit (from the past 240 hour(s)).   Time coordinating discharge: 25 minutes  SIGNED:   Lanae Boast, MD  [facility] 03/13/2018, 7:52 AM  If 7PM-7AM, please contact night-coverage [email or web address]

## 2018-03-19 ENCOUNTER — Inpatient Hospital Stay: Payer: BLUE CROSS/BLUE SHIELD | Admitting: Family Medicine

## 2018-03-21 ENCOUNTER — Ambulatory Visit: Payer: Self-pay | Admitting: Physician Assistant

## 2018-03-22 ENCOUNTER — Ambulatory Visit: Payer: BLUE CROSS/BLUE SHIELD | Admitting: Family Medicine

## 2018-03-22 ENCOUNTER — Encounter: Payer: Self-pay | Admitting: Family Medicine

## 2018-03-22 VITALS — BP 110/70 | HR 82 | Ht 66.0 in | Wt 156.4 lb

## 2018-03-22 DIAGNOSIS — K922 Gastrointestinal hemorrhage, unspecified: Secondary | ICD-10-CM | POA: Diagnosis not present

## 2018-03-22 DIAGNOSIS — E1022 Type 1 diabetes mellitus with diabetic chronic kidney disease: Secondary | ICD-10-CM | POA: Diagnosis not present

## 2018-03-22 DIAGNOSIS — Z09 Encounter for follow-up examination after completed treatment for conditions other than malignant neoplasm: Secondary | ICD-10-CM | POA: Diagnosis not present

## 2018-03-22 DIAGNOSIS — N182 Chronic kidney disease, stage 2 (mild): Secondary | ICD-10-CM | POA: Diagnosis not present

## 2018-03-22 LAB — BASIC METABOLIC PANEL
BUN: 15 mg/dL (ref 6–23)
CO2: 29 mEq/L (ref 19–32)
Calcium: 9.3 mg/dL (ref 8.4–10.5)
Chloride: 102 mEq/L (ref 96–112)
Creatinine, Ser: 1.33 mg/dL (ref 0.40–1.50)
GFR: 74.18 mL/min (ref >60.00)
Glucose, Bld: 258 mg/dL — ABNORMAL HIGH (ref 70–99)
Potassium: 4.7 mEq/L (ref 3.5–5.1)
Sodium: 137 mEq/L (ref 135–145)

## 2018-03-22 LAB — CBC
HCT: 41.3 % (ref 39.0–52.0)
Hemoglobin: 13.7 g/dL (ref 13.0–17.0)
MCHC: 33.1 g/dL (ref 30.0–36.0)
MCV: 84.3 fl (ref 78.0–100.0)
Platelets: 134 10*3/uL — ABNORMAL LOW (ref 150.0–400.0)
RBC: 4.9 Mil/uL (ref 4.22–5.81)
RDW: 12.6 % (ref 11.5–15.5)
WBC: 2.9 10*3/uL — ABNORMAL LOW (ref 4.0–10.5)

## 2018-03-22 NOTE — Progress Notes (Signed)
Established Patient Office Visit  Subjective:  Patient ID: Tyler [name], male    DOB: 1985-11-03  Age: 33 y.o. MRN: [id number]  CC:  Chief Complaint  Patient presents with  . Hospitalization Follow-up    HPI Tyler [name] presents for hospital discharge follow-up status post acute upper GI bleed.  He had been experiencing hematemesis.  There was no melena or bleeding per rectum.  Apparently he had run out of his Protonix.  EGD showed diffuse mild sonolucent bleeding without distinct lesions.  Hemoglobin was 13.9.  Patient will schedule follow-up with endocrinology.  Hemoglobin A1c was 8.511 days ago.  Patient lives alone.  He is no longer in the stressful situation as described in my last visit with him.  Patient is no longer experiencing nausea vomiting hematemesis and his stooling has remained normal.  He is tolerating a normal diet.  Past Medical History:  Diagnosis Date  . Anxiety   . Depression   . Diabetic peripheral neuropathy (HCC)   . Hematemesis/vomiting blood admissions 09/2013, 01/02/2014  . Proteinuria   . PTSD (post-traumatic stress disorder)   . Stomach ulcer   . Type I diabetes mellitus (HCC)     Past Surgical History:  Procedure Laterality Date  . APPENDECTOMY    . BIOPSY  03/12/2018   Procedure: BIOPSY;  Surgeon: Meryl Dare, MD;  Location: [facility] ENDOSCOPY;  Service: Endoscopy;;  . ESOPHAGOGASTRODUODENOSCOPY N/A 01/03/2014   Procedure: ESOPHAGOGASTRODUODENOSCOPY (EGD);  Surgeon: Barrie Folk, MD;  Location: [facility] ENDOSCOPY;  Service: Endoscopy;  Laterality: N/A;  . ESOPHAGOGASTRODUODENOSCOPY N/A 08/27/2016   Procedure: EGD with possible interventions;  Surgeon: Napoleon Form, MD;  Location: [facility] ENDOSCOPY;  Service: Endoscopy;  Laterality: N/A;  . ESOPHAGOGASTRODUODENOSCOPY (EGD) WITH PROPOFOL N/A 03/12/2018   Procedure: ESOPHAGOGASTRODUODENOSCOPY (EGD) WITH PROPOFOL;  Surgeon: Meryl Dare, MD;  Location: [facility] ENDOSCOPY;  Service: Endoscopy;   Laterality: N/A;  . HOT HEMOSTASIS N/A 03/12/2018   Procedure: HOT HEMOSTASIS (ARGON PLASMA COAGULATION/BICAP);  Surgeon: Meryl Dare, MD;  Location: [facility] ENDOSCOPY;  Service: Endoscopy;  Laterality: N/A;    Family History  Problem Relation Age of Onset  . HIV/AIDS Father   . Drug abuse Father   . Drug abuse Sister   . Diabetes Cousin   . Suicidality Neg Hx   . Bipolar disorder Neg Hx   . Depression Neg Hx   . Anxiety disorder Neg Hx     Social History   Socioeconomic History  . Marital status: Single    Spouse name: Not on file  . Number of children: 1  . Years of education: Not on file  . Highest education level: Not on file  Occupational History  . Occupation: Naval architect work  Engineer, production  . Financial resource strain: Not on file  . Food insecurity:    Worry: Not on file    Inability: Not on file  . Transportation needs:    Medical: Not on file    Non-medical: Not on file  Tobacco Use  . Smoking status: Never Smoker  . Smokeless tobacco: Never Used  Substance and Sexual Activity  . Alcohol use: No  . Drug use: No  . Sexual activity: Yes  Lifestyle  . Physical activity:    Days per week: Not on file    Minutes per session: Not on file  . Stress: Not on file  Relationships  . Social connections:    Talks on phone: Not on file    Gets together:  Not on file    Attends religious service: Not on file    Active member of club or organization: Not on file    Attends meetings of clubs or organizations: Not on file    Relationship status: Not on file  . Intimate partner violence:    Fear of current or ex partner: Not on file    Emotionally abused: Not on file    Physically abused: Not on file    Forced sexual activity: Not on file  Other Topics Concern  . Not on file  Social History Narrative  . Not on file    Outpatient Medications Prior to Visit  Medication Sig Dispense Refill  . acetaminophen (TYLENOL) 325 MG tablet Take 2 tablets (650 mg total) by  mouth every 6 (six) hours as needed for mild pain (or Fever >/= 101). 30 tablet 0  . BD PEN NEEDLE NANO U/F 32G X 4 MM MISC USE AS DIRECTED WITH BASAGLAR 100 each 2  . insulin aspart (NOVOLOG FLEXPEN) 100 UNIT/ML FlexPen Inject 2 Units into the skin 3 (three) times daily with meals. 15 mL 2  . Insulin Glargine (BASAGLAR KWIKPEN) 100 UNIT/ML SOPN Inject 0.16 mLs (16 Units total) into the skin every morning. 15 mL 11  . metoCLOPramide (REGLAN) 5 MG tablet Take 1 tablet (5 mg total) by mouth 3 (three) times daily before meals. (Patient not taking: Reported on 03/11/2018) 90 tablet 0  . pantoprazole (PROTONIX) 40 MG tablet Take 40 mg by mouth two times a day 60 tablet 0   No facility-administered medications prior to visit.     Allergies  Allergen Reactions  . Banana Anaphylaxis  . Food Itching and Swelling    Walnuts- THROAT ITCHES AND EYES SWELL  . Ibuprofen Other (See Comments)    ULCERS  . Nsaids Other (See Comments)    ULCERS     ROS Review of Systems  Constitutional: Negative.   HENT: Negative.   Eyes: Negative for photophobia and visual disturbance.  Respiratory: Negative.   Cardiovascular: Negative.   Gastrointestinal: Negative.  Negative for anal bleeding, blood in stool, constipation, diarrhea, nausea and vomiting.  Endocrine: Negative for polyphagia and polyuria.  Genitourinary: Negative.   Musculoskeletal: Negative.   Allergic/Immunologic: Negative for immunocompromised state.  Neurological: Negative.   Hematological: Negative.   Psychiatric/Behavioral: Negative.       Objective:    Physical Exam  Constitutional: He is oriented to person, place, and time. He appears well-developed and well-nourished. No distress.  HENT:  Head: Normocephalic and atraumatic.  Right Ear: External ear normal.  Left Ear: External ear normal.  Mouth/Throat: Oropharynx is clear and moist. No oropharyngeal exudate.  Eyes: Pupils are equal, round, and reactive to light. Conjunctivae are  normal. Right eye exhibits no discharge. Left eye exhibits no discharge.  Neck: Neck supple. No JVD present. No tracheal deviation present. No thyromegaly present.  Cardiovascular: Normal rate, regular rhythm and normal heart sounds.  Pulmonary/Chest: Effort normal and breath sounds normal. No stridor. No respiratory distress. He has no wheezes. He has no rales.  Abdominal: Bowel sounds are normal. He exhibits no distension. There is abdominal tenderness (some ttp along the left upper and lower quadrants). There is no rebound and no guarding.  Lymphadenopathy:    He has no cervical adenopathy.  Neurological: He is alert and oriented to person, place, and time.  Skin: Skin is warm and dry. He is not diaphoretic.  Psychiatric: He has a normal mood and affect. His  behavior is normal.    BP 110/70   Pulse 82   Ht 5\' 6"  (1.676 m)   Wt 156 lb 6 oz (70.9 kg)   SpO2 98%   BMI 25.24 kg/m  Wt Readings from Last 3 Encounters:  03/22/18 156 lb 6 oz (70.9 kg)  03/13/18 155 lb 11.2 oz (70.6 kg)  11/30/17 153 lb 9.6 oz (69.7 kg)   BP Readings from Last 3 Encounters:  03/22/18 110/70  03/13/18 (!) 98/51  11/30/17 104/64   Guideline developer:  UpToDate (see UpToDate for funding source) Date Released: June 2014  Health Maintenance Due  Topic Date Due  . PNEUMOCOCCAL POLYSACCHARIDE VACCINE AGE 62-64 HIGH RISK  07/22/1985  . TETANUS/TDAP  07/23/2002  . OPHTHALMOLOGY EXAM  05/19/2016  . INFLUENZA VACCINE  08/24/2017  . URINE MICROALBUMIN  02/23/2018    There are no preventive care reminders to display for this patient.  Lab Results  Component Value Date   TSH 2.60 02/23/2017   Lab Results  Component Value Date   WBC 4.9 03/13/2018   HGB 13.8 03/13/2018   HCT 43.0 03/13/2018   MCV 84.3 03/13/2018   PLT 121 (L) 03/13/2018   Lab Results  Component Value Date   NA 138 03/12/2018   K 3.9 03/12/2018   CO2 28 03/12/2018   GLUCOSE 149 (H) 03/12/2018   BUN 7 03/12/2018   CREATININE  1.45 (H) 03/12/2018   BILITOT 1.2 03/11/2018   ALKPHOS 50 03/11/2018   AST 28 03/11/2018   ALT 18 03/11/2018   PROT 6.9 03/11/2018   ALBUMIN 3.7 03/11/2018   CALCIUM 8.6 (L) 03/12/2018   ANIONGAP 7 03/12/2018   GFR 91.10 02/23/2017   Lab Results  Component Value Date   CHOL 170 02/23/2017   Lab Results  Component Value Date   HDL 40.00 02/23/2017   Lab Results  Component Value Date   LDLCALC 110 (H) 02/23/2017   Lab Results  Component Value Date   TRIG 102.0 02/23/2017   Lab Results  Component Value Date   CHOLHDL 4 02/23/2017   Lab Results  Component Value Date   HGBA1C 8.5 (H) 03/11/2018      Assessment & Plan:   Problem List Items Addressed This Visit      Digestive   Upper GI bleed   Relevant Orders   CBC   Iron, TIBC and Ferritin Panel     Endocrine   Insulin dependent type 1 diabetes mellitus (HCC)   Relevant Orders   Basic metabolic panel     Other   Hospital discharge follow-up - Primary   Relevant Orders   Basic metabolic panel   CBC      No orders of the defined types were placed in this encounter.   Follow-up: Return if symptoms worsen or fail to improve.

## 2018-03-23 LAB — IRON,TIBC AND FERRITIN PANEL
%SAT: 36 % (calc) (ref 20–48)
Ferritin: 60 ng/mL (ref 38–380)
Iron: 74 ug/dL (ref 50–180)
TIBC: 207 ug/dL — AB (ref 250–425)

## 2018-04-13 ENCOUNTER — Encounter: Payer: Self-pay | Admitting: Family Medicine

## 2018-04-13 ENCOUNTER — Ambulatory Visit: Payer: BLUE CROSS/BLUE SHIELD | Admitting: Family Medicine

## 2018-04-13 ENCOUNTER — Other Ambulatory Visit: Payer: Self-pay

## 2018-04-13 VITALS — BP 120/70 | HR 67 | Temp 98.0°F | Ht 66.0 in | Wt 156.2 lb

## 2018-04-13 DIAGNOSIS — R0982 Postnasal drip: Secondary | ICD-10-CM

## 2018-04-13 DIAGNOSIS — J029 Acute pharyngitis, unspecified: Secondary | ICD-10-CM

## 2018-04-13 LAB — POCT RAPID STREP A (OFFICE): Rapid Strep A Screen: NEGATIVE

## 2018-04-13 MED ORDER — CETIRIZINE HCL 10 MG PO TABS: 10.0000 mg | ORAL_TABLET | Freq: Every day | ORAL | 1 refills | Status: DC

## 2018-04-13 NOTE — Progress Notes (Signed)
Established Patient Office Visit  Subjective:  Patient ID: Tyler [name], male    DOB: [birth year]  Age: 33 y.o. MRN: [id number]  CC:  Chief Complaint  Patient presents with  . Cough  . Sore Throat    x a week, sore, hurts to swallow at times    HPI Tyler [name] presents for evaluation and treatment of a one-week history of mild headache postnasal drip mild cough, intermittent sore throat and diminished appetite.  Patient denies fevers chills nausea or vomiting facial pressure teeth pain myalgias or arthralgias.  He has no asthma history.  He does not smoke.  He does have a history of springtime allergies.  Past Medical History:  Diagnosis Date  . Anxiety   . Depression   . Diabetic peripheral neuropathy (HCC)   . Hematemesis/vomiting blood admissions 09/2013, 01/02/2014  . Proteinuria   . PTSD (post-traumatic stress disorder)   . Stomach ulcer   . Type I diabetes mellitus (HCC)     Past Surgical History:  Procedure Laterality Date  . APPENDECTOMY    . BIOPSY  03/12/2018   Procedure: BIOPSY;  Surgeon: Meryl DareStark, Malcolm T, MD;  [facility];  Service: Endoscopy;;  . ESOPHAGOGASTRODUODENOSCOPY N/A 01/03/2014   Procedure: ESOPHAGOGASTRODUODENOSCOPY (EGD);  Surgeon: Barrie FolkJohn C Hayes, MD;  [facility];  Service: Endoscopy;  Laterality: N/A;  . ESOPHAGOGASTRODUODENOSCOPY N/A 08/27/2016   Procedure: EGD with possible interventions;  Surgeon: Napoleon FormNandigam, Kavitha V, MD;  Location: [facility] ENDOSCOPY;  Service: Endoscopy;  Laterality: N/A;  . ESOPHAGOGASTRODUODENOSCOPY (EGD) WITH PROPOFOL N/A 03/12/2018   Procedure: ESOPHAGOGASTRODUODENOSCOPY (EGD) WITH PROPOFOL;  Surgeon: Meryl DareStark, Malcolm T, MD;  [facility];  Service: Endoscopy;  Laterality: N/A;  . HOT HEMOSTASIS N/A 03/12/2018   Procedure: HOT HEMOSTASIS (ARGON PLASMA COAGULATION/BICAP);  Surgeon: Meryl DareStark, Malcolm T, MD;  [facility];  Service: Endoscopy;  Laterality: N/A;    Family History  Problem  Relation Age of Onset  . HIV/AIDS Father   . Drug abuse Father   . Drug abuse Sister   . Diabetes Cousin   . Suicidality Neg Hx   . Bipolar disorder Neg Hx   . Depression Neg Hx   . Anxiety disorder Neg Hx     Social History   Socioeconomic History  . Marital status: Single    Spouse name: Not on file  . Number of children: 1  . Years of education: Not on file  . Highest education level: Not on file  Occupational History  . Occupation: Naval architectwarehouse work  Engineer, productionocial Needs  . Financial resource strain: Not on file  . Food insecurity:    Worry: Not on file    Inability: Not on file  . Transportation needs:    Medical: Not on file    Non-medical: Not on file  Tobacco Use  . Smoking status: Never Smoker  . Smokeless tobacco: Never Used  Substance and Sexual Activity  . Alcohol use: No  . Drug use: No  . Sexual activity: Yes  Lifestyle  . Physical activity:    Days per week: Not on file    Minutes per session: Not on file  . Stress: Not on file  Relationships  . Social connections:    Talks on phone: Not on file    Gets together: Not on file    Attends religious service: Not on file    Active member of club or organization: Not on file    Attends meetings of clubs or organizations: Not  on file    Relationship status: Not on file  . Intimate partner violence:    Fear of current or ex partner: Not on file    Emotionally abused: Not on file    Physically abused: Not on file    Forced sexual activity: Not on file  Other Topics Concern  . Not on file  Social History Narrative  . Not on file    Outpatient Medications Prior to Visit  Medication Sig Dispense Refill  . acetaminophen (TYLENOL) 325 MG tablet Take 2 tablets (650 mg total) by mouth every 6 (six) hours as needed for mild pain (or Fever >/= 101). 30 tablet 0  . BD PEN NEEDLE NANO U/F 32G X 4 MM MISC USE AS DIRECTED WITH BASAGLAR 100 each 2  . insulin aspart (NOVOLOG FLEXPEN) 100 UNIT/ML FlexPen Inject 2 Units into  the skin 3 (three) times daily with meals. 15 mL 2  . Insulin Glargine (BASAGLAR KWIKPEN) 100 UNIT/ML SOPN Inject 0.16 mLs (16 Units total) into the skin every morning. 15 mL 11  . metoCLOPramide (REGLAN) 5 MG tablet Take 1 tablet (5 mg total) by mouth 3 (three) times daily before meals. 90 tablet 0  . pantoprazole (PROTONIX) 40 MG tablet Take 40 mg by mouth two times a day 60 tablet 0   No facility-administered medications prior to visit.     Allergies  Allergen Reactions  . Banana Anaphylaxis  . Food Itching and Swelling    Walnuts- THROAT ITCHES AND EYES SWELL  . Ibuprofen Other (See Comments)    ULCERS  . Nsaids Other (See Comments)    ULCERS     ROS Review of Systems  Constitutional: Negative for chills, diaphoresis, fatigue, fever and unexpected weight change.  HENT: Positive for congestion, postnasal drip and sore throat. Negative for rhinorrhea, sinus pressure, sinus pain and voice change.   Respiratory: Positive for cough. Negative for shortness of breath and wheezing.   Cardiovascular: Negative.   Gastrointestinal: Negative for abdominal pain, nausea and vomiting.  Genitourinary: Negative.   Musculoskeletal: Negative for arthralgias and myalgias.  Neurological: Positive for headaches.  Hematological: Negative.   Psychiatric/Behavioral: Negative.       Objective:    Physical Exam  Constitutional: He is oriented to person, place, and time. He appears well-developed and well-nourished. No distress.  HENT:  Head: Normocephalic and atraumatic.  Right Ear: External ear normal.  Left Ear: External ear normal.  Mouth/Throat: Oropharynx is clear and moist. No oropharyngeal exudate.  Eyes: Pupils are equal, round, and reactive to light. Conjunctivae are normal. Right eye exhibits no discharge. Left eye exhibits no discharge. No scleral icterus.  Neck: Neck supple. No JVD present. No tracheal deviation present. No thyromegaly present.  Cardiovascular: Normal rate, regular  rhythm and normal heart sounds.  Pulmonary/Chest: Effort normal and breath sounds normal. No stridor. No respiratory distress. He has no wheezes. He has no rales.  Abdominal: Bowel sounds are normal.  Lymphadenopathy:    He has no cervical adenopathy.  Neurological: He is alert and oriented to person, place, and time.  Skin: Skin is warm and dry. He is not diaphoretic.  Psychiatric: He has a normal mood and affect. His behavior is normal.    BP 120/70   Pulse 67   Temp 98 F (36.7 C) (Oral)   Ht 5\' 6"  (1.676 m)   Wt 156 lb 4 oz (70.9 kg)   SpO2 99%   BMI 25.22 kg/m  Wt Readings from Last 3  Encounters:  04/13/18 156 lb 4 oz (70.9 kg)  03/22/18 156 lb 6 oz (70.9 kg)  03/13/18 155 lb 11.2 oz (70.6 kg)   BP Readings from Last 3 Encounters:  04/13/18 120/70  03/22/18 110/70  03/13/18 (!) 98/51   Guideline developer:  UpToDate (see UpToDate for funding source) Date Released: June 2014  Health Maintenance Due  Topic Date Due  . PNEUMOCOCCAL POLYSACCHARIDE VACCINE AGE 73-64 HIGH RISK  07/22/1985  . TETANUS/TDAP  07/23/2002  . OPHTHALMOLOGY EXAM  05/19/2016  . INFLUENZA VACCINE  08/24/2017  . URINE MICROALBUMIN  02/23/2018    There are no preventive care reminders to display for this patient.  Lab Results  Component Value Date   TSH 2.60 02/23/2017   Lab Results  Component Value Date   WBC 2.9 (L) 03/22/2018   HGB 13.7 03/22/2018   HCT 41.3 03/22/2018   MCV 84.3 03/22/2018   PLT 134.0 (L) 03/22/2018   Lab Results  Component Value Date   NA 137 03/22/2018   K 4.7 03/22/2018   CO2 29 03/22/2018   GLUCOSE 258 (H) 03/22/2018   BUN 15 03/22/2018   CREATININE 1.33 03/22/2018   BILITOT 1.2 03/11/2018   ALKPHOS 50 03/11/2018   AST 28 03/11/2018   ALT 18 03/11/2018   PROT 6.9 03/11/2018   ALBUMIN 3.7 03/11/2018   CALCIUM 9.3 03/22/2018   ANIONGAP 7 03/12/2018   GFR 74.18 03/22/2018   Lab Results  Component Value Date   CHOL 170 02/23/2017   Lab Results   Component Value Date   HDL 40.00 02/23/2017   Lab Results  Component Value Date   LDLCALC 110 (H) 02/23/2017   Lab Results  Component Value Date   TRIG 102.0 02/23/2017   Lab Results  Component Value Date   CHOLHDL 4 02/23/2017   Lab Results  Component Value Date   HGBA1C 8.5 (H) 03/11/2018      Assessment & Plan:   Problem List Items Addressed This Visit      Other   Sore throat - Primary   Relevant Orders   POC Rapid Strep A (Completed)   Post-nasal drip   Relevant Medications   cetirizine (ZYRTEC) 10 MG tablet      Meds ordered this encounter  Medications  . cetirizine (ZYRTEC) 10 MG tablet    Sig: Take 1 tablet (10 mg total) by mouth at bedtime.    Dispense:  30 tablet    Refill:  1    Follow-up: Return in about 1 week (around 04/20/2018), or if symptoms worsen or fail to improve.

## 2018-04-13 NOTE — Patient Instructions (Signed)
Cetirizine tablets  What is this medicine?  CETIRIZINE (se TI ra zeen) is an antihistamine. This medicine is used to treat or prevent symptoms of allergies. It is also used to help reduce itchy skin rash and hives.  This medicine may be used for other purposes; ask your health care provider or pharmacist if you have questions.  COMMON BRAND NAME(S): All Day Allergy, Allergy Relief, Zyrtec, Zyrtec Hives Relief  What should I tell my health care provider before I take this medicine?  They need to know if you have any of these conditions:  -kidney disease  -liver disease  -an unusual or allergic reaction to cetirizine, hydroxyzine, other medicines, foods, dyes, or preservatives  -pregnant or trying to get pregnant  -breast-feeding  How should I use this medicine?  Take this medicine by mouth with a glass of water. Follow the directions on the prescription label. You can take this medicine with food or on an empty stomach. Take your medicine at regular times. Do not take more often than directed. You may need to take this medicine for several days before your symptoms improve.  Talk to your pediatrician regarding the use of this medicine in children. Special care may be needed. While this drug may be prescribed for children as young as 6 years of age for selected conditions, precautions do apply.  Overdosage: If you think you have taken too much of this medicine contact a poison control center or emergency room at once.  NOTE: This medicine is only for you. Do not share this medicine with others.  What if I miss a dose?  If you miss a dose, take it as soon as you can. If it is almost time for your next dose, take only that dose. Do not take double or extra doses.  What may interact with this medicine?  -alcohol  -certain medicines for anxiety or sleep  -narcotic medicines for pain  -other medicines for colds or allergies  This list may not describe all possible interactions. Give your health care provider a list of all  the medicines, herbs, non-prescription drugs, or dietary supplements you use. Also tell them if you smoke, drink alcohol, or use illegal drugs. Some items may interact with your medicine.  What should I watch for while using this medicine?  Visit your doctor or health care professional for regular checks on your health. Tell your doctor if your symptoms do not improve.  You may get drowsy or dizzy. Do not drive, use machinery, or do anything that needs mental alertness until you know how this medicine affects you. Do not stand or sit up quickly, especially if you are an older patient. This reduces the risk of dizzy or fainting spells.  Your mouth may get dry. Chewing sugarless gum or sucking hard candy, and drinking plenty of water may help. Contact your doctor if the problem does not go away or is severe.  What side effects may I notice from receiving this medicine?  Side effects that you should report to your doctor or health care professional as soon as possible:  -allergic reactions like skin rash, itching or hives, swelling of the face, lips, or tongue  -changes in vision or hearing  -fast or irregular heartbeat  -trouble passing urine or change in the amount of urine  Side effects that usually do not require medical attention (report to your doctor or health care professional if they continue or are bothersome):  -dizziness  -dry mouth  -irritability  -  sore throat  -stomach pain  -tiredness  This list may not describe all possible side effects. Call your doctor for medical advice about side effects. You may report side effects to FDA at 1-800-FDA-1088.  Where should I keep my medicine?  Keep out of the reach of children.  Store at room temperature between 15 and 30 degrees C (59 and 86 degrees F). Throw away any unused medicine after the expiration date.  NOTE: This sheet is a summary. It may not cover all possible information. If you have questions about this medicine, talk to your doctor, pharmacist, or  health care provider.  © 2019 Elsevier/Gold Standard (2014-02-04 13:44:42)

## 2018-05-06 ENCOUNTER — Other Ambulatory Visit: Payer: Self-pay | Admitting: Family Medicine

## 2018-05-06 DIAGNOSIS — R0982 Postnasal drip: Secondary | ICD-10-CM

## 2018-06-05 ENCOUNTER — Other Ambulatory Visit: Payer: Self-pay | Admitting: Family Medicine

## 2018-06-05 DIAGNOSIS — R0982 Postnasal drip: Secondary | ICD-10-CM

## 2018-06-19 ENCOUNTER — Other Ambulatory Visit: Payer: Self-pay | Admitting: Family Medicine

## 2018-06-19 DIAGNOSIS — R0982 Postnasal drip: Secondary | ICD-10-CM

## 2018-06-20 MED ORDER — CETIRIZINE HCL 10 MG PO TABS: ORAL_TABLET | ORAL | 1 refills | Status: AC

## 2018-06-20 NOTE — Addendum Note (Signed)
Addended by: Kathreen Devoid on: 06/20/2018 08:10 AM   Modules accepted: Orders

## 2018-07-02 ENCOUNTER — Ambulatory Visit: Payer: Self-pay | Admitting: Family Medicine

## 2018-07-11 ENCOUNTER — Telehealth: Payer: Self-pay

## 2018-07-11 NOTE — Telephone Encounter (Signed)
Pt called expressing interest in getting an insulin pump. Not currently on a pump but would like to discuss further. Scheduled for f/u with Dr. Loanne Drilling 08/03/18. Message routed to Leonia Reader, RN to further discuss and address his questions.

## 2018-07-16 NOTE — Telephone Encounter (Signed)
Appointment scheduled for Tuesday at [place]

## 2018-07-17 ENCOUNTER — Other Ambulatory Visit (INDEPENDENT_AMBULATORY_CARE_PROVIDER_SITE_OTHER): Payer: Medicaid Other

## 2018-07-17 ENCOUNTER — Other Ambulatory Visit: Payer: Self-pay

## 2018-07-17 ENCOUNTER — Encounter: Payer: Medicaid Other | Attending: Endocrinology | Admitting: Nutrition

## 2018-07-17 DIAGNOSIS — E1021 Type 1 diabetes mellitus with diabetic nephropathy: Secondary | ICD-10-CM

## 2018-07-17 DIAGNOSIS — E1065 Type 1 diabetes mellitus with hyperglycemia: Secondary | ICD-10-CM

## 2018-07-17 LAB — BASIC METABOLIC PANEL
BUN: 12 mg/dL (ref 6–23)
CO2: 31 mEq/L (ref 19–32)
Calcium: 9.3 mg/dL (ref 8.4–10.5)
Chloride: 101 mEq/L (ref 96–112)
Creatinine, Ser: 1.22 mg/dL (ref 0.40–1.50)
GFR: 81.8 mL/min (ref >60.00)
Glucose, Bld: 192 mg/dL — ABNORMAL HIGH (ref 70–99)
Potassium: 4.1 mEq/L (ref 3.5–5.1)
Sodium: 136 mEq/L (ref 135–145)

## 2018-07-18 LAB — C-PEPTIDE: C-Peptide: 0.36 ng/mL — ABNORMAL LOW (ref 0.80–3.85)

## 2018-07-18 NOTE — Patient Instructions (Signed)
Please call when your pump and CGM come in to set up an appointment for training.   Call if questions.

## 2018-07-18 NOTE — Progress Notes (Signed)
We discussed the advantages/disadvantages of insulin pump therapy.  The patient was shown the 3 different insulin pumps, and CGM systems.  We discussed the advantages and disadvantages of each system.  Questions were answered about each system.  He was given brochures for each pump system, and encouraged to go on line for more information.  He decided on the [employer] insulin pump, and paperwork was filled out and faxed.  He was directed to the lab for a C-peptide and FBS.  Fasting blood sugar this AM was 185.   He was told to call me when his pump comes in to set up an appointment for training.  He had no final questions.

## 2018-07-23 NOTE — Progress Notes (Signed)
C-peptide and FBS faxed to [employer].

## 2018-08-01 ENCOUNTER — Other Ambulatory Visit: Payer: Self-pay

## 2018-08-03 ENCOUNTER — Other Ambulatory Visit: Payer: Self-pay

## 2018-08-03 ENCOUNTER — Ambulatory Visit (INDEPENDENT_AMBULATORY_CARE_PROVIDER_SITE_OTHER): Payer: Medicaid Other | Admitting: Endocrinology

## 2018-08-03 ENCOUNTER — Encounter: Payer: Self-pay | Admitting: Endocrinology

## 2018-08-03 VITALS — BP 108/70 | HR 82 | Ht 66.0 in | Wt 157.6 lb

## 2018-08-03 DIAGNOSIS — E1021 Type 1 diabetes mellitus with diabetic nephropathy: Secondary | ICD-10-CM

## 2018-08-03 LAB — POCT GLYCOSYLATED HEMOGLOBIN (HGB A1C): Hemoglobin A1C: 9.1 % — AB (ref 4.0–5.6)

## 2018-08-03 MED ORDER — TRESIBA FLEXTOUCH 100 UNIT/ML ~~LOC~~ SOPN: 16.0000 [IU] | PEN_INJECTOR | Freq: Every day | SUBCUTANEOUS | 11 refills | Status: DC

## 2018-08-03 NOTE — Patient Instructions (Addendum)
Please change the basalgar to 16 units each morning.  To help you remember it, put it next to something you use each morning, such as your toothbrush or breakfast.  On this type of insulin schedule, you should eat meals on a regular schedule.  If a meal is missed or significantly delayed, your blood sugar could go low.   To avoid it going low in the middle of the night, eat a snack at bedtime.   check your blood sugar 4 times a day.  vary the time of day when you check, between before the 3 meals, and at bedtime.  also check if you have symptoms of your blood sugar being too high or too low.  please keep a record of the readings and bring it to your next appointment here.  You can write it on any piece of paper.  please call us sooner if your blood sugar goes below 70, or if you have a lot of readings over 200.  Please come back for a follow-up appointment in 2 months.

## 2018-08-03 NOTE — Progress Notes (Signed)
Subjective:    Patient ID: Tyler [name], male    DOB: 1985-02-24, 33 y.o.   MRN: [id number]  HPI Pt returns for f/u of diabetes mellitus:  DM type: 1 Dx'ed: 5176 Complications: nephropathy.  Therapy: insulin since soon after dx.  DKA: never Severe hypoglycemia: never.  Pancreatitis: never.  Other: he takes multiple daily injections, but emphasizes basal insulin; he now works 1st shift, indust mfg; he declines pump rx for now.   Interval history: pt says he sometimes misses the insulin. no cbg record, but states cbg's vary from 18-170.  He has mild hypoglycemia in the middle of the night.  He [facility] basaglar at [facility].   He wants to use up basaglar before changing.  He has several months' worth to use up.   Past Medical History:  Diagnosis Date  . Anxiety   . Depression   . Diabetic peripheral neuropathy ([place])   . Hematemesis/vomiting blood admissions 09/2013, 01/02/2014  . Proteinuria   . PTSD (post-traumatic stress disorder)   . Stomach ulcer   . Type I diabetes mellitus ([place])     Past Surgical History:  Procedure Laterality Date  . APPENDECTOMY    . BIOPSY  03/12/2018   Procedure: BIOPSY;  Surgeon: Ladene Artist, MD;  Location: [facility] ENDOSCOPY;  Service: Endoscopy;;  . ESOPHAGOGASTRODUODENOSCOPY N/A 01/03/2014   Procedure: ESOPHAGOGASTRODUODENOSCOPY (EGD);  Surgeon: Missy Sabins, MD;  Location: [facility] ENDOSCOPY;  Service: Endoscopy;  Laterality: N/A;  . ESOPHAGOGASTRODUODENOSCOPY N/A 08/27/2016   Procedure: EGD with possible interventions;  Surgeon: Mauri Pole, MD;  Location: [facility] ENDOSCOPY;  Service: Endoscopy;  Laterality: N/A;  . ESOPHAGOGASTRODUODENOSCOPY (EGD) WITH PROPOFOL N/A 03/12/2018   Procedure: ESOPHAGOGASTRODUODENOSCOPY (EGD) WITH PROPOFOL;  Surgeon: Ladene Artist, MD;  Location: [facility] ENDOSCOPY;  Service: Endoscopy;  Laterality: N/A;  . HOT HEMOSTASIS N/A 03/12/2018   Procedure: HOT HEMOSTASIS (ARGON PLASMA COAGULATION/BICAP);  Surgeon: Ladene Artist, MD;   Location[facility] END[facility]COPY;  Service: Endoscopy;  Laterality: N/A;    Social History   Socioeconomic History  . Marital status: Single    Spouse name: Not on file  . Number of children: 1  . Years of education: Not on file  . Highest education level: Not on file  Occupational History  . Occupation: Proofreader work  Scientific laboratory technician  . Financial resource strain: Not on file  . Food insecurity    Worry: Not on file    Inability: Not on file  . Transportation needs    Medical: Not on file    Non-medical: Not on file  Tobacco Use  . Smoking status: Never Smoker  . Smokeless tobacco: Never Used  Substance and Sexual Activity  . Alcohol use: No  . Drug use: No  . Sexual activity: Yes  Lifestyle  . Physical activity    Days per week: Not on file    Minutes per session: Not on file  . Stress: Not on file  Relationships  . Social Herbalist on phone: Not on file    Gets together: Not on file    Attends religious service: Not on file    Active member of club or organization: Not on file    Attends meetings of clubs or organizations: Not on file    Relationship status: Not on file  . Intimate partner violence    Fear of current or ex partner: Not on file    Emotionally abused: Not on file    Physically abused:  Not on file    Forced sexual activity: Not on file  Other Topics Concern  . Not on file  Social History Narrative  . Not on file    Current Outpatient Medications on File Prior to Visit  Medication Sig Dispense Refill  . acetaminophen (TYLENOL) 325 MG tablet Take 2 tablets (650 mg total) by mouth every 6 (six) hours as needed for mild pain (or Fever >/= 101). 30 tablet 0  . BD PEN NEEDLE NANO U/F 32G X 4 MM MISC USE AS DIRECTED WITH BASAGLAR 100 each 2  . cetirizine (ZYRTEC) 10 MG tablet TAKE 1 TABLET BY MOUTH EVERYDAY AT BEDTIME 90 tablet 1  . Insulin Glargine (BASAGLAR KWIKPEN) 100 UNIT/ML SOPN Inject 0.16 mLs (16 Units total) into the skin every morning. 15 mL  11  . metoCLOPramide (REGLAN) 5 MG tablet Take 1 tablet (5 mg total) by mouth 3 (three) times daily before meals. 90 tablet 0  . pantoprazole (PROTONIX) 40 MG tablet Take 40 mg by mouth two times a day 60 tablet 0   No current facility-administered medications on file prior to visit.     Allergies  Allergen Reactions  . Banana Anaphylaxis  . Food Itching and Swelling    Walnuts- THROAT ITCHES AND EYES SWELL  . Ibuprofen Other (See Comments)    ULCERS  . Nsaids Other (See Comments)    ULCERS     Family History  Problem Relation Age of Onset  . HIV/AIDS Father   . Drug abuse Father   . Drug abuse Sister   . Diabetes Cousin   . Suicidality Neg Hx   . Bipolar disorder Neg Hx   . Depression Neg Hx   . Anxiety disorder Neg Hx     BP 108/70 (BP Location: Left Arm, Patient Position: Sitting, Cuff Size: Normal)   Pulse 82   Ht 5\' 6"  (1.676 m)   Wt 157 lb 9.6 oz (71.5 kg)   SpO2 95%   BMI 25.44 kg/m    Review of Systems He has lost a few lbs.      Objective:   Physical Exam VITAL SIGNS:  See vs page GENERAL: no distress Pulses: dorsalis pedis intact bilat.   MSK: no deformity of the feet CV: no leg edema Skin:  no ulcer on the feet.  normal color and temp on the feet. Neuro: sensation is intact to touch on the feet   A1c=9.1%    Assessment & Plan:  Type 1 DM, with DN: worse.  Hypoglycemia: this limits aggressiveness of glycemic control.  Noncompliance with cbg recording: tresiba offers dosing flexibility.   Patient Instructions  Please change the basalgar to 16 units each morning.  To help you remember it, put it next to something you use each morning, such as your toothbrush or breakfast.  On this type of insulin schedule, you should eat meals on a regular schedule.  If a meal is missed or significantly delayed, your blood sugar could go low.   To avoid it going low in the middle of the night, eat a snack at bedtime.   check your blood sugar 4 times a day.   vary the time of day when you check, between before the 3 meals, and at bedtime.  also check if you have symptoms of your blood sugar being too high or too low.  please keep a record of the readings and bring it to your next appointment here.  You can write it on any  piece of paper.  please call us sooner if your blood sugar goes below 70, or if you have a lot of readings over 200.  Please come back for a follow-up appointment in 2 months.

## 2018-10-04 ENCOUNTER — Ambulatory Visit: Payer: Medicaid Other | Admitting: Endocrinology

## 2018-10-22 ENCOUNTER — Other Ambulatory Visit: Payer: Self-pay

## 2018-10-23 ENCOUNTER — Encounter: Payer: Self-pay | Admitting: Endocrinology

## 2018-10-23 ENCOUNTER — Ambulatory Visit (INDEPENDENT_AMBULATORY_CARE_PROVIDER_SITE_OTHER): Payer: Medicaid Other | Admitting: Endocrinology

## 2018-10-23 ENCOUNTER — Telehealth: Payer: Self-pay | Admitting: Endocrinology

## 2018-10-23 VITALS — BP 114/80 | HR 84 | Ht 66.0 in | Wt 161.6 lb

## 2018-10-23 DIAGNOSIS — E1021 Type 1 diabetes mellitus with diabetic nephropathy: Secondary | ICD-10-CM

## 2018-10-23 LAB — POCT GLYCOSYLATED HEMOGLOBIN (HGB A1C): Hemoglobin A1C: 7.6 % — AB (ref 4.0–5.6)

## 2018-10-23 MED ORDER — BASAGLAR KWIKPEN 100 UNIT/ML ~~LOC~~ SOPN: 12.0000 [IU] | PEN_INJECTOR | SUBCUTANEOUS | 3 refills | Status: DC

## 2018-10-23 MED ORDER — SILDENAFIL CITRATE 100 MG PO TABS: 100.0000 mg | ORAL_TABLET | Freq: Every day | ORAL | 11 refills | Status: AC | PRN

## 2018-10-23 NOTE — Progress Notes (Signed)
Subjective:    Patient ID: Tyler [name], male    DOB: [birth year], 33 y.o.   MRN: [id number]  HPI Pt returns for f/u of diabetes mellitus:  DM type: 1 Dx'ed: 2010 Complications: DN Therapy: insulin since soon after dx.  DKA: never Severe hypoglycemia: never.  Pancreatitis: never.  Other: he takes multiple daily injections, but emphasizes basal insulin; he now works 1st shift, indust mfg; he declines pump rx for now.   Interval history: Pt says he reduced the Lantus to 12 units qam, due to hypoglycemia.  He has been on this lower dosage x 2 weeks.  no cbg record, but states on this lower dosage, cbg's vary from 45-130.  It is in general lowest when a meal is missed or delayed.  Pt says he has not recently missed the insulin.  ED sxs persist.  Viagra helps. Past Medical History:  Diagnosis Date  . Anxiety   . Depression   . Diabetic peripheral neuropathy (HCC)   . Hematemesis/vomiting blood admissions 09/2013, 01/02/2014  . Proteinuria   . PTSD (post-traumatic stress disorder)   . Stomach ulcer   . Type I diabetes mellitus (HCC)     Past Surgical History:  Procedure Laterality Date  . APPENDECTOMY    . BIOPSY  03/12/2018   Procedure: BIOPSY;  Surgeon: Meryl DareStark, Malcolm T, MD;  [facility];  Service: Endoscopy;;  . ESOPHAGOGASTRODUODENOSCOPY N/A 01/03/2014   Procedure: ESOPHAGOGASTRODUODENOSCOPY (EGD);  Surgeon: Barrie FolkJohn C Hayes, MD;  [facility];  Service: Endoscopy;  Laterality: N/A;  . ESOPHAGOGASTRODUODENOSCOPY N/A 08/27/2016   Procedure: EGD with possible interventions;  Surgeon: Napoleon FormNandigam, Kavitha V, MD;  Location: [facility];  Service: Endoscopy;  Laterality: N/A;  . ESOPHAGOGASTRODUODENOSCOPY (EGD) WITH PROPOFOL N/A 03/12/2018   Procedure: ESOPHAGOGASTRODUODENOSCOPY (EGD) WITH PROPOFOL;  Surgeon: Meryl DareStark, Malcolm T, MD;  [facility];  Service: Endoscopy;  Laterality: N/A;  . HOT HEMOSTASIS N/A 03/12/2018   Procedure: HOT HEMOSTASIS (ARGON PLASMA  COAGULATION/BICAP);  Surgeon: Meryl DareStark, Malcolm T, MD;  [facility];  Service: Endoscopy;  Laterality: N/A;    Social History   Socioeconomic History  . Marital status: Single    Spouse name: Not on file  . Number of children: 1  . Years of education: Not on file  . Highest education level: Not on file  Occupational History  . Occupation: Naval architectwarehouse work  Engineer, productionocial Needs  . Financial resource strain: Not on file  . Food insecurity    Worry: Not on file    Inability: Not on file  . Transportation needs    Medical: Not on file    Non-medical: Not on file  Tobacco Use  . Smoking status: Never Smoker  . Smokeless tobacco: Never Used  Substance and Sexual Activity  . Alcohol use: No  . Drug use: No  . Sexual activity: Yes  Lifestyle  . Physical activity    Days per week: Not on file    Minutes per session: Not on file  . Stress: Not on file  Relationships  . Social Musicianconnections    Talks on phone: Not on file    Gets together: Not on file    Attends religious service: Not on file    Active member of club or organization: Not on file    Attends meetings of clubs or organizations: Not on file    Relationship status: Not on file  . Intimate partner violence    Fear of current or ex partner: Not on file  Emotionally abused: Not on file    Physically abused: Not on file    Forced sexual activity: Not on file  Other Topics Concern  . Not on file  Social History Narrative  . Not on file    Current Outpatient Medications on File Prior to Visit  Medication Sig Dispense Refill  . acetaminophen (TYLENOL) 325 MG tablet Take 2 tablets (650 mg total) by mouth every 6 (six) hours as needed for mild pain (or Fever >/= 101). 30 tablet 0  . BD PEN NEEDLE NANO U/F 32G X 4 MM MISC USE AS DIRECTED WITH BASAGLAR 100 each 2  . cetirizine (ZYRTEC) 10 MG tablet TAKE 1 TABLET BY MOUTH EVERYDAY AT BEDTIME 90 tablet 1  . metoCLOPramide (REGLAN) 5 MG tablet Take 1 tablet (5 mg total) by  mouth 3 (three) times daily before meals. 90 tablet 0  . pantoprazole (PROTONIX) 40 MG tablet Take 40 mg by mouth two times a day 60 tablet 0   No current facility-administered medications on file prior to visit.     Allergies  Allergen Reactions  . Banana Anaphylaxis  . Food Itching and Swelling    Walnuts- THROAT ITCHES AND EYES SWELL  . Ibuprofen Other (See Comments)    ULCERS  . Nsaids Other (See Comments)    ULCERS     Family History  Problem Relation Age of Onset  . HIV/AIDS Father   . Drug abuse Father   . Drug abuse Sister   . Diabetes Cousin   . Suicidality Neg Hx   . Bipolar disorder Neg Hx   . Depression Neg Hx   . Anxiety disorder Neg Hx     BP 114/80 (BP Location: Left Arm, Patient Position: Sitting, Cuff Size: Normal)   Pulse 84   Ht 5\' 6"  (1.676 m)   Wt 161 lb 9.6 oz (73.3 kg)   SpO2 96%   BMI 26.08 kg/m   Review of Systems Denies LOC.     Objective:   Physical Exam VITAL SIGNS:  See vs page GENERAL: no distress Pulses: dorsalis pedis intact bilat.   MSK: no deformity of the feet CV: no leg edema Skin:  no ulcer on the feet.  normal color and temp on the feet. Neuro: sensation is intact to touch on the feet  Lab Results  Component Value Date   CREATININE 1.22 07/17/2018   BUN 12 07/17/2018   NA 136 07/17/2018   K 4.1 07/17/2018   CL 101 07/17/2018   CO2 31 07/17/2018   Lab Results  Component Value Date   HGBA1C 7.6 (A) 10/23/2018       Assessment & Plan:  Insulin-requiring type 2 DM, with DN Hypoglycemia: this limits aggressiveness of glycemic control ED: well-controlled.  Please continue the same medication.   Patient Instructions  Please continue the same insulin On this type of insulin schedule, you should eat meals on a regular schedule.  If a meal is missed or significantly delayed, your blood sugar could go low.   To avoid it going low in the middle of the night, eat a snack at bedtime.    check your blood sugar 4 times a  day.  vary the time of day when you check, between before the 3 meals, and at bedtime.  also check if you have symptoms of your blood sugar being too high or too low.  please keep a record of the readings and bring it to your next appointment here.  You  can write it on any piece of paper.  please call us sooner if your blood sugar goes below 70, or if you have a lot of readings over 200.  Please come back for a follow-up appointment in 2-3 months.

## 2018-10-23 NOTE — Patient Instructions (Addendum)
Please continue the same insulin On this type of insulin schedule, you should eat meals on a regular schedule.  If a meal is missed or significantly delayed, your blood sugar could go low.   To avoid it going low in the middle of the night, eat a snack at bedtime.    check your blood sugar 4 times a day.  vary the time of day when you check, between before the 3 meals, and at bedtime.  also check if you have symptoms of your blood sugar being too high or too low.  please keep a record of the readings and bring it to your next appointment here.  You can write it on any piece of paper.  please call us sooner if your blood sugar goes below 70, or if you have a lot of readings over 200.  Please come back for a follow-up appointment in 2-3 months.

## 2018-10-23 NOTE — Telephone Encounter (Signed)
Per Patient [facility] has advised him that a prior authorization for the Aviva Test Strips and lancets is going to be required for them to cover.  Prior Authorization is going to be sent   [facility] [address]

## 2018-10-23 NOTE — Telephone Encounter (Signed)
Please advise about request to refill Viagra. In terms of PA, will await response from pharmacy to determine if an alternative device is covered by plan.

## 2019-01-29 ENCOUNTER — Ambulatory Visit: Payer: Medicaid Other | Admitting: Endocrinology

## 2019-02-04 ENCOUNTER — Other Ambulatory Visit: Payer: Self-pay

## 2019-02-04 ENCOUNTER — Encounter: Payer: Self-pay | Admitting: Endocrinology

## 2019-02-04 ENCOUNTER — Ambulatory Visit (INDEPENDENT_AMBULATORY_CARE_PROVIDER_SITE_OTHER): Payer: Medicaid Other | Admitting: Endocrinology

## 2019-02-04 VITALS — BP 126/88 | HR 80 | Ht 66.0 in | Wt 157.8 lb

## 2019-02-04 DIAGNOSIS — E1021 Type 1 diabetes mellitus with diabetic nephropathy: Secondary | ICD-10-CM

## 2019-02-04 LAB — POCT GLYCOSYLATED HEMOGLOBIN (HGB A1C): Hemoglobin A1C: 9.1 % — AB (ref 4.0–5.6)

## 2019-02-04 MED ORDER — TRESIBA FLEXTOUCH 100 UNIT/ML ~~LOC~~ SOPN: 16.0000 [IU] | PEN_INJECTOR | Freq: Every day | SUBCUTANEOUS | 11 refills | Status: DC

## 2019-02-04 NOTE — Patient Instructions (Addendum)
Please change the insulin back to "tresiba," 16 units daily On this type of insulin schedule, you should eat meals on a regular schedule.  If a meal is missed or significantly delayed, your blood sugar could go low.    check your blood sugar 4 times a day.  vary the time of day when you check, between before the 3 meals, and at bedtime.  also check if you have symptoms of your blood sugar being too high or too low.  please keep a record of the readings and bring it to your next appointment here.  You can write it on any piece of paper.  please call us sooner if your blood sugar goes below 70, or if you have a lot of readings over 200.  I have sent Tyler [name] a message, asking about the progress of your pump request Please come back for a follow-up appointment in 2 months.

## 2019-02-04 NOTE — Progress Notes (Signed)
Subjective:    Patient ID: Tyler [name], male    DOB: June 28, 1985, 34 y.o.   MRN: [id number]  HPI Pt returns for f/u of diabetes mellitus:  DM type: 1 Dx'ed: 4098 Complications: DN Therapy: insulin since soon after dx.  DKA: never Severe hypoglycemia: never.  Pancreatitis: never.  Other: he takes multiple daily injections, but emphasizes basal insulin; he declines pump rx for now.   Interval history: Pt says he sometimes misses insulin doses, due to his schedule.  no cbg record, but states cbg's vary from 85-300.  pt states he feels well in general.  He hs now back on 3rd shift.  He wants to reconsider pump rx.  He could not afford humalog.   Past Medical History:  Diagnosis Date  . Anxiety   . Depression   . Diabetic peripheral neuropathy ([place])   . Hematemesis/vomiting blood admissions 09/2013, 01/02/2014  . Proteinuria   . PTSD (post-traumatic stress disorder)   . Stomach ulcer   . Type I diabetes mellitus ([place])     Past Surgical History:  Procedure Laterality Date  . APPENDECTOMY    . BIOPSY  03/12/2018   Procedure: BIOPSY;  Surgeon: Ladene Artist, MD;  Location[facility] END[facility]COPY;  Service: Endoscopy;;  . ESOPHAGOGASTRODUODENOSCOPY N/A 01/03/2014   Procedure: ESOPHAGOGASTRODUODENOSCOPY (EGD);  Surgeon: Missy Sabins, MD;  Location: [facility] ENDOSCOPY;  Service: Endoscopy;  Laterality: N/A;  . ESOPHAGOGASTRODUODENOSCOPY N/A 08/27/2016   Procedure: EGD with possible interventions;  Surgeon: Mauri Pole, MD;  Location: [facility] ENDOSCOPY;  Service: Endoscopy;  Laterality: N/A;  . ESOPHAGOGASTRODUODENOSCOPY (EGD) WITH PROPOFOL N/A 03/12/2018   Procedure: ESOPHAGOGASTRODUODENOSCOPY (EGD) WITH PROPOFOL;  Surgeon: Ladene Artist, MD;  Location: [facility] ENDOSCOPY;  Service: Endoscopy;  Laterality: N/A;  . HOT HEMOSTASIS N/A 03/12/2018   Procedure: HOT HEMOSTASIS (ARGON PLASMA COAGULATION/BICAP);  Surgeon: Ladene Artist, MD;  [facility];  Service: Endoscopy;  Laterality: N/A;      Social History   Socioeconomic History  . Marital status: Single    Spouse name: Not on file  . Number of children: 1  . Years of education: Not on file  . Highest education level: Not on file  Occupational History  . Occupation: warehouse work  Tobacco Use  . Smoking status: Never Smoker  . Smokeless tobacco: Never Used  Substance and Sexual Activity  . Alcohol use: No  . Drug use: No  . Sexual activity: Yes  Other Topics Concern  . Not on file  Social History Narrative  . Not on file   Social Determinants of Health   Financial Resource Strain:   . Difficulty of Paying Living Expenses: Not on file  Food Insecurity:   . Worried About Charity fundraiser in the Last Year: Not on file  . Ran Out of Food in the Last Year: Not on file  Transportation Needs:   . Lack of Transportation (Medical): Not on file  . Lack of Transportation (Non-Medical): Not on file  Physical Activity:   . Days of Exercise per Week: Not on file  . Minutes of Exercise per Session: Not on file  Stress:   . Feeling of Stress : Not on file  Social Connections:   . Frequency of Communication with Friends and Family: Not on file  . Frequency of Social Gatherings with Friends and Family: Not on file  . Attends Religious Services: Not on file  . Active Member of Clubs or Organizations: Not on file  .  Attends Banker Meetings: Not on file  . Marital Status: Not on file  Intimate Partner Violence:   . Fear of Current or Ex-Partner: Not on file  . Emotionally Abused: Not on file  . Physically Abused: Not on file  . Sexually Abused: Not on file    Current Outpatient Medications on File Prior to Visit  Medication Sig Dispense Refill  . acetaminophen (TYLENOL) 325 MG tablet Take 2 tablets (650 mg total) by mouth every 6 (six) hours as needed for mild pain (or Fever >/= 101). 30 tablet 0  . BD PEN NEEDLE NANO U/F 32G X 4 MM MISC USE AS DIRECTED WITH BASAGLAR 100 each 2  . cetirizine  (ZYRTEC) 10 MG tablet TAKE 1 TABLET BY MOUTH EVERYDAY AT BEDTIME 90 tablet 1  . metoCLOPramide (REGLAN) 5 MG tablet Take 1 tablet (5 mg total) by mouth 3 (three) times daily before meals. 90 tablet 0  . pantoprazole (PROTONIX) 40 MG tablet Take 40 mg by mouth two times a day 60 tablet 0  . sildenafil (VIAGRA) 100 MG tablet Take 1 tablet (100 mg total) by mouth daily as needed for erectile dysfunction. 10 tablet 11   No current facility-administered medications on file prior to visit.    Allergies  Allergen Reactions  . Banana Anaphylaxis  . Food Itching and Swelling    Walnuts- THROAT ITCHES AND EYES SWELL  . Ibuprofen Other (See Comments)    ULCERS  . Nsaids Other (See Comments)    ULCERS     Family History  Problem Relation Age of Onset  . HIV/AIDS Father   . Drug abuse Father   . Drug abuse Sister   . Diabetes Cousin   . Suicidality Neg Hx   . Bipolar disorder Neg Hx   . Depression Neg Hx   . Anxiety disorder Neg Hx     BP 126/88 (BP Location: Left Arm, Patient Position: Sitting, Cuff Size: Normal)   Pulse 80   Ht 5\' 6"  (1.676 m)   Wt 157 lb 12.8 oz (71.6 kg)   SpO2 98%   BMI 25.47 kg/m   Review of Systems He denies hypoglycemia.      Objective:   Physical Exam VITAL SIGNS:  See vs page GENERAL: no distress Pulses: dorsalis pedis intact bilat.   MSK: no deformity of the feet CV: no leg edema Skin:  no ulcer on the feet.  normal color and temp on the feet. Neuro: sensation is intact to touch on the feet  Lab Results  Component Value Date   HGBA1C 9.1 (A) 02/04/2019       Assessment & Plan:  Type 1 DM, with DN: worse Occupational status: slowest-acting basal insulin is preferred.    Patient Instructions  Please change the insulin back to "tresiba," 16 units daily On this type of insulin schedule, you should eat meals on a regular schedule.  If a meal is missed or significantly delayed, your blood sugar could go low.    check your blood sugar 4 times  a day.  vary the time of day when you check, between before the 3 meals, and at bedtime.  also check if you have symptoms of your blood sugar being too high or too low.  please keep a record of the readings and bring it to your next appointment here.  You can write it on any piece of paper.  please call 04/04/2019 sooner if your blood sugar goes below 70, or if  you have a lot of readings over 200.  I have sent Bonita Quin a message, asking about the progress of your pump request Please come back for a follow-up appointment in 2 months.

## 2019-02-13 ENCOUNTER — Telehealth: Payer: Self-pay

## 2019-02-13 ENCOUNTER — Other Ambulatory Visit: Payer: Self-pay

## 2019-02-13 ENCOUNTER — Other Ambulatory Visit: Payer: Self-pay | Admitting: Endocrinology

## 2019-02-13 DIAGNOSIS — E1021 Type 1 diabetes mellitus with diabetic nephropathy: Secondary | ICD-10-CM

## 2019-02-13 MED ORDER — BASAGLAR KWIKPEN 100 UNIT/ML ~~LOC~~ SOPN: 16.0000 [IU] | PEN_INJECTOR | Freq: Every day | SUBCUTANEOUS | 11 refills | Status: DC

## 2019-02-13 MED ORDER — TRESIBA FLEXTOUCH 100 UNIT/ML ~~LOC~~ SOPN: 16.0000 [IU] | PEN_INJECTOR | Freq: Every day | SUBCUTANEOUS | 2 refills | Status: DC

## 2019-02-13 NOTE — Telephone Encounter (Signed)
Outpatient Medication Detail   Disp Refills Start End   insulin degludec (TRESIBA FLEXTOUCH) 100 UNIT/ML SOPN FlexTouch Pen 4.8 mL 2 02/13/2019    Sig - Route: Inject 0.16 mLs (16 Units total) into the skin daily. - Subcutaneous   Sent to pharmacy as: insulin degludec (TRESIBA FLEXTOUCH) 100 UNIT/ML Solution Pen-injector FlexTouch Pen   E-Prescribing Status: Receipt confirmed by pharmacy (02/13/2019  3:21 PM EST)

## 2019-02-13 NOTE — Telephone Encounter (Signed)
MEDICATION: insulin degludec (TRESIBA FLEXTOUCH) 100 UNIT/ML SOPN FlexTouch Pen  PH[facility]ACY:  [id number] [place], [place] - [address]  IS THIS A 90 DAY SUPPLY :   IS PATIENT OUT OF MEDICATION:   IF NOT; HOW MUCH IS LEFT:   LAST APPOINTMENT DATE: @1 /11/2019  NEXT APPOINTMENT DATE:@3 /10/2019  DO WE HAVE YOUR PERMISSION TO LEAVE A DETAILED MESSAGE:  OTHER COMMENTS:    **Let patient know to contact pharmacy at the end of the day to make sure medication is ready. **  ** Please notify patient to allow 48-72 hours to process**  **Encourage patient to contact the pharmacy for refills or they can request refills through [facility]**

## 2019-02-14 ENCOUNTER — Telehealth: Payer: Self-pay

## 2019-02-14 ENCOUNTER — Other Ambulatory Visit: Payer: Self-pay

## 2019-02-14 DIAGNOSIS — E1021 Type 1 diabetes mellitus with diabetic nephropathy: Secondary | ICD-10-CM

## 2019-02-14 MED ORDER — LANTUS SOLOSTAR 100 UNIT/ML ~~LOC~~ SOPN: 16.0000 [IU] | PEN_INJECTOR | Freq: Every day | SUBCUTANEOUS | 2 refills | Status: DC

## 2019-02-14 NOTE — Telephone Encounter (Signed)
Patient is calling back saying that he received a voicemail earlier stating we needed him to call our office. I did see that Ammie contacted patient about his RX - can you please call patient to advise on RX.

## 2019-02-14 NOTE — Telephone Encounter (Signed)
Once again, [facility] that [facility] [name] is not covered by pt insurance plan but does cover Lantus. Provided Dr. Everardo All with this communication. Agreed to change to Lantus. Once again, a new Rx was sent to replace Basaglar for Lantus. Will await to see if this insulin will either be approved or denied by insurance.  Outpatient Medication Detail   Disp Refills Start End   Insulin Glargine (LANTUS SOLOSTAR) 100 UNIT/ML Solostar Pen 5 mL 2 02/14/2019    Sig - Route: Inject 16 Units into the skin daily. - Subcutaneous   Sent to pharmacy as: Insulin Glargine (LANTUS SOLOSTAR) 100 UNIT/ML Solostar Pen   E-Prescribing Status: Receipt confirmed by pharmacy (02/14/2019 12:13 PM EST)

## 2019-02-14 NOTE — Telephone Encounter (Signed)
[facility] that insurance does not cover [place]. Provided Dr. Everardo All with this correspondence for further advice. After review, Dr. Everardo All ordered to change Tyler [name] to [name]. Called pt to make him aware. LVM requesting returned call.  Outpatient Medication Detail   Disp Refills Start End   Insulin Glargine (BASAGLAR KWIKPEN) 100 UNIT/ML SOPN 15 mL 11 02/13/2019    Sig - Route: Inject 0.16 mLs (16 Units total) into the skin daily. And pen needles 1/day - Subcutaneous   Sent to pharmacy as: Insulin Glargine (BASAGLAR KWIKPEN) 100 UNIT/ML Solution Pen-injector   E-Prescribing Status: Receipt confirmed by pharmacy (02/13/2019 4:40 PM EST)    Ph[facility]acy  [id number] [facility], [place] - [address]  [address], [place] [address]  Phone:  [phone] Fax:  [phone]

## 2019-02-15 NOTE — Telephone Encounter (Signed)
Returned pt call. Informed him of the ongoing issues listed below. Verbalized acceptance and understanding.

## 2019-03-12 ENCOUNTER — Telehealth: Payer: Self-pay | Admitting: Nutrition

## 2019-03-12 NOTE — Telephone Encounter (Signed)
[employer] forms faxed to [employer] with lab work to determine insurance verification and cost on 10/23/18

## 2019-03-15 ENCOUNTER — Telehealth: Payer: Self-pay | Admitting: Endocrinology

## 2019-03-15 ENCOUNTER — Other Ambulatory Visit: Payer: Self-pay

## 2019-03-15 DIAGNOSIS — E1021 Type 1 diabetes mellitus with diabetic nephropathy: Secondary | ICD-10-CM

## 2019-03-15 MED ORDER — BASAGLAR KWIKPEN 100 UNIT/ML ~~LOC~~ SOPN: 16.0000 [IU] | PEN_INJECTOR | Freq: Every day | SUBCUTANEOUS | 0 refills | Status: DC

## 2019-03-15 NOTE — Telephone Encounter (Signed)
Either is fine with me.  

## 2019-03-15 NOTE — Telephone Encounter (Signed)
Outpatient Medication Detail   Disp Refills Start End   Insulin Glargine (BASAGLAR KWIKPEN) 100 UNIT/ML SOPN 4.8 mL 0 03/15/2019    Sig - Route: Inject 0.16 mLs (16 Units total) into the skin daily. - Subcutaneous   Sent to pharmacy as: Insulin Glargine (BASAGLAR KWIKPEN) 100 UNIT/ML Solution Pen-injector   E-Prescribing Status: Receipt confirmed by pharmacy (03/15/2019  1:15 PM EST)

## 2019-03-15 NOTE — Telephone Encounter (Signed)
Insurance does not cover LANTUS anymore - they told patient they now cover BASAGLAR regular and 70/30. Patient would like to know if this would be okay to take as an alternative. Ph# [phone]

## 2019-03-15 NOTE — Telephone Encounter (Signed)
From 02/14/19 encounter:  Once again, [facility] that Tyler [name] is not covered by pt insurance plan but does cover Lantus. Provided Dr. Everardo All with this communication. Agreed to change to Lantus. Once again, a new Rx was sent to replace Basaglar for Lantus. Will await to see if this insulin will either be approved or denied by insurance.  Please advise. I am not understanding the back and forth, changing of Rx's from Lantus to [employer], Basaglar to Lantus. This seems to be an ongoing issue.

## 2019-03-18 ENCOUNTER — Other Ambulatory Visit: Payer: Self-pay | Admitting: Endocrinology

## 2019-03-18 DIAGNOSIS — E1021 Type 1 diabetes mellitus with diabetic nephropathy: Secondary | ICD-10-CM

## 2019-03-18 MED ORDER — LANTUS SOLOSTAR 100 UNIT/ML ~~LOC~~ SOPN: 16.0000 [IU] | PEN_INJECTOR | Freq: Every day | SUBCUTANEOUS | 2 refills | Status: DC

## 2019-03-19 NOTE — Telephone Encounter (Signed)
Pt returned call. Informed of all information below. Pt states he does not want to call his insurance company again. States he has already provided our office with the covered insulins and feels we do not understand. Pt insisted to schedule an appt with Dr. Everardo All to further discuss. Pt has been scheduled for 03/20/19 @ 10:30am. Routing to Dr. Everardo All to make him aware of scheduled appt.

## 2019-03-19 NOTE — Telephone Encounter (Signed)
Once again we have received [facility] that Lantus is not covered by pt plan. Rx's have been changed as follows: Tresiba > Basaglar > Lantus. None of these insulins have been shown to be covered by pt plan. Spoke to Dr. Everardo All. Per Dr. Everardo All, pt will need to discuss with insurance company to determine WHAT is actually covered and WHAT needs a PA if required. No further action can be taken until pt calls his insurance company to further discuss. In addition, not seeing an updated insurance card scanned in to Epic. Last scanned copy is [employer] from 2019. Will also need current insurance card in order to complete PA IF REQUIRED.

## 2019-03-20 ENCOUNTER — Ambulatory Visit: Payer: Medicaid Other | Admitting: Endocrinology

## 2019-03-22 ENCOUNTER — Ambulatory Visit (INDEPENDENT_AMBULATORY_CARE_PROVIDER_SITE_OTHER): Payer: Medicaid Other | Admitting: Endocrinology

## 2019-03-22 ENCOUNTER — Other Ambulatory Visit: Payer: Self-pay

## 2019-03-22 ENCOUNTER — Encounter: Payer: Self-pay | Admitting: Endocrinology

## 2019-03-22 VITALS — BP 132/74 | HR 73 | Ht 66.0 in | Wt 156.4 lb

## 2019-03-22 DIAGNOSIS — E1021 Type 1 diabetes mellitus with diabetic nephropathy: Secondary | ICD-10-CM

## 2019-03-22 LAB — POCT GLYCOSYLATED HEMOGLOBIN (HGB A1C): Hemoglobin A1C: 9.5 % — AB (ref 4.0–5.6)

## 2019-03-22 MED ORDER — NOVOLIN 70/30 RELION (70-30) 100 UNIT/ML ~~LOC~~ SUSP: 9.0000 [IU] | Freq: Two times a day (BID) | SUBCUTANEOUS | 11 refills | Status: DC

## 2019-03-22 NOTE — Patient Instructions (Addendum)
Please change the insulin to "70/30," 9 units twice a day (just before 2 meals, approx 12 hours apart) On this type of insulin schedule, you should eat meals on a regular schedule.  If a meal is missed or significantly delayed, your blood sugar could go low.    check your blood sugar 4 times a day.  vary the time of day when you check, between before the 3 meals, and at bedtime.  also check if you have symptoms of your blood sugar being too high or too low.  please keep a record of the readings and bring it to your next appointment here.  You can write it on any piece of paper.  please call us sooner if your blood sugar goes below 70, or if you have a lot of readings over 200.   Please come back for a follow-up appointment in 2 months.

## 2019-03-22 NOTE — Progress Notes (Signed)
Subjective:    Patient ID: Tyler [name], male    DOB: 08/13/1985, 34 y.o.   MRN: [id number]  HPI Pt returns for f/u of diabetes mellitus:  DM type: 1.  Dx'ed: 2010 Complications: DN Therapy: insulin since soon after dx.  DKA: never Severe hypoglycemia: once, 2020. Pancreatitis: never.  Other: he takes multiple daily injections, but emphasizes basal insulin; he declines pump rx for now;  SDOH: pt says medicaid does not pay for meds, and he cannot afford; he works 3rd shift Interval history: Pt says he still sometimes misses insulin doses, due to forgetting to take insulin.  He therefore wants to use pump rx.  no cbg record, but states cbg's vary from 80-125.  pt states he feels well in general.  He takes Basaglar, 16 units qd.  He seldom has hypoglycemia, and these episodes are mild.   Past Medical History:  Diagnosis Date  . Anxiety   . Depression   . Diabetic peripheral neuropathy (HCC)   . Hematemesis/vomiting blood admissions 09/2013, 01/02/2014  . Proteinuria   . PTSD (post-traumatic stress disorder)   . Stomach ulcer   . Type I diabetes mellitus (HCC)     Past Surgical History:  Procedure Laterality Date  . APPENDECTOMY    . BIOPSY  03/12/2018   Procedure: BIOPSY;  Surgeon: Meryl Dare, MD;  Location: [facility] ENDOSCOPY;  Service: Endoscopy;;  . ESOPHAGOGASTRODUODENOSCOPY N/A 01/03/2014   Procedure: ESOPHAGOGASTRODUODENOSCOPY (EGD);  Surgeon: Barrie Folk, MD;  Location: [facility] ENDOSCOPY;  Service: Endoscopy;  Laterality: N/A;  . ESOPHAGOGASTRODUODENOSCOPY N/A 08/27/2016   Procedure: EGD with possible interventions;  Surgeon: Napoleon Form, MD;  Location: [facility] ENDOSCOPY;  Service: Endoscopy;  Laterality: N/A;  . ESOPHAGOGASTRODUODENOSCOPY (EGD) WITH PROPOFOL N/A 03/12/2018   Procedure: ESOPHAGOGASTRODUODENOSCOPY (EGD) WITH PROPOFOL;  Surgeon: Meryl Dare, MD;  Location: [facility] ENDOSCOPY;  Service: Endoscopy;  Laterality: N/A;  . HOT HEMOSTASIS N/A 03/12/2018   Procedure:  HOT HEMOSTASIS (ARGON PLASMA COAGULATION/BICAP);  Surgeon: Meryl Dare, MD;  Location: [facility] ENDOSCOPY;  Service: Endoscopy;  Laterality: N/A;    Social History   Socioeconomic History  . Marital status: Single    Spouse name: Not on file  . Number of children: 1  . Years of education: Not on file  . Highest education level: Not on file  Occupational History  . Occupation: warehouse work  Tobacco Use  . Smoking status: Never Smoker  . Smokeless tobacco: Never Used  Substance and Sexual Activity  . Alcohol use: No  . Drug use: No  . Sexual activity: Yes  Other Topics Concern  . Not on file  Social History Narrative  . Not on file   Social Determinants of Health   Financial Resource Strain:   . Difficulty of Paying Living Expenses: Not on file  Food Insecurity:   . Worried About Programme researcher, broadcasting/film/video in the Last Year: Not on file  . Ran Out of Food in the Last Year: Not on file  Transportation Needs:   . Lack of Transportation (Medical): Not on file  . Lack of Transportation (Non-Medical): Not on file  Physical Activity:   . Days of Exercise per Week: Not on file  . Minutes of Exercise per Session: Not on file  Stress:   . Feeling of Stress : Not on file  Social Connections:   . Frequency of Communication with Friends and Family: Not on file  . Frequency of Social Gatherings with Friends and Family: Not  on file  . Attends Religious Services: Not on file  . Active Member of Clubs or Organizations: Not on file  . Attends Archivist Meetings: Not on file  . Marital Status: Not on file  Intimate Partner Violence:   . Fear of Current or Ex-Partner: Not on file  . Emotionally Abused: Not on file  . Physically Abused: Not on file  . Sexually Abused: Not on file    Current Outpatient Medications on File Prior to Visit  Medication Sig Dispense Refill  . acetaminophen (TYLENOL) 325 MG tablet Take 2 tablets (650 mg total) by mouth every 6 (six) hours as needed for  mild pain (or Fever >/= 101). 30 tablet 0  . cetirizine (ZYRTEC) 10 MG tablet TAKE 1 TABLET BY MOUTH EVERYDAY AT BEDTIME 90 tablet 1  . metoCLOPramide (REGLAN) 5 MG tablet Take 1 tablet (5 mg total) by mouth 3 (three) times daily before meals. 90 tablet 0  . pantoprazole (PROTONIX) 40 MG tablet Take 40 mg by mouth two times a day 60 tablet 0  . sildenafil (VIAGRA) 100 MG tablet Take 1 tablet (100 mg total) by mouth daily as needed for erectile dysfunction. 10 tablet 11   No current facility-administered medications on file prior to visit.    Allergies  Allergen Reactions  . Banana Anaphylaxis  . Food Itching and Swelling    Walnuts- THROAT ITCHES AND EYES SWELL  . Ibuprofen Other (See Comments)    ULCERS  . Nsaids Other (See Comments)    ULCERS     Family History  Problem Relation Age of Onset  . HIV/AIDS Father   . Drug abuse Father   . Drug abuse Sister   . Diabetes Cousin   . Suicidality Neg Hx   . Bipolar disorder Neg Hx   . Depression Neg Hx   . Anxiety disorder Neg Hx     BP 132/74 (BP Location: Left Arm, Patient Position: Sitting, Cuff Size: Normal)   Pulse 73   Ht 5\' 6"  (1.676 m)   Wt 156 lb 6.4 oz (70.9 kg)   SpO2 99%   BMI 25.24 kg/m    Review of Systems Denies LOC.      Objective:   Physical Exam VITAL SIGNS:  See vs page GENERAL: no distress Pulses: dorsalis pedis intact bilat.   MSK: no deformity of the feet CV: no leg edema Skin:  no ulcer on the feet.  normal color and temp on the feet.  Neuro: sensation is intact to touch on the feet.   Ext: there is bilateral onychomycosis of the toenails.    Lab Results  Component Value Date   HGBA1C 9.5 (A) 03/22/2019       Assessment & Plan:  Type 1 DM, with DN: worse SDOH: he needs generic insulin Occupational status: hew needs symmetrical BID dosing  Patient Instructions  Please change the insulin to "70/30," 9 units twice a day (just before 2 meals, approx 12 hours apart) On this type of  insulin schedule, you should eat meals on a regular schedule.  If a meal is missed or significantly delayed, your blood sugar could go low.    check your blood sugar 4 times a day.  vary the time of day when you check, between before the 3 meals, and at bedtime.  also check if you have symptoms of your blood sugar being too high or too low.  please keep a record of the readings and bring it to  your next appointment here.  You can write it on any piece of paper.  please call us sooner if your blood sugar goes below 70, or if you have a lot of readings over 200.   Please come back for a follow-up appointment in 2 months.

## 2019-04-03 ENCOUNTER — Ambulatory Visit: Payer: Medicaid Other | Admitting: Endocrinology

## 2019-05-21 ENCOUNTER — Ambulatory Visit: Payer: Medicaid Other | Admitting: Endocrinology

## 2020-03-23 IMAGING — CR DG HAND COMPLETE 3+V*L*
4 series · 4 of 4 positions shown · non-contrast
Comparison: Hand radiograph December 04, 2013

CLINICAL DATA: Slammed hand in car door.

EXAM:
LEFT HAND - COMPLETE 3+ VIEW

[hand pa]
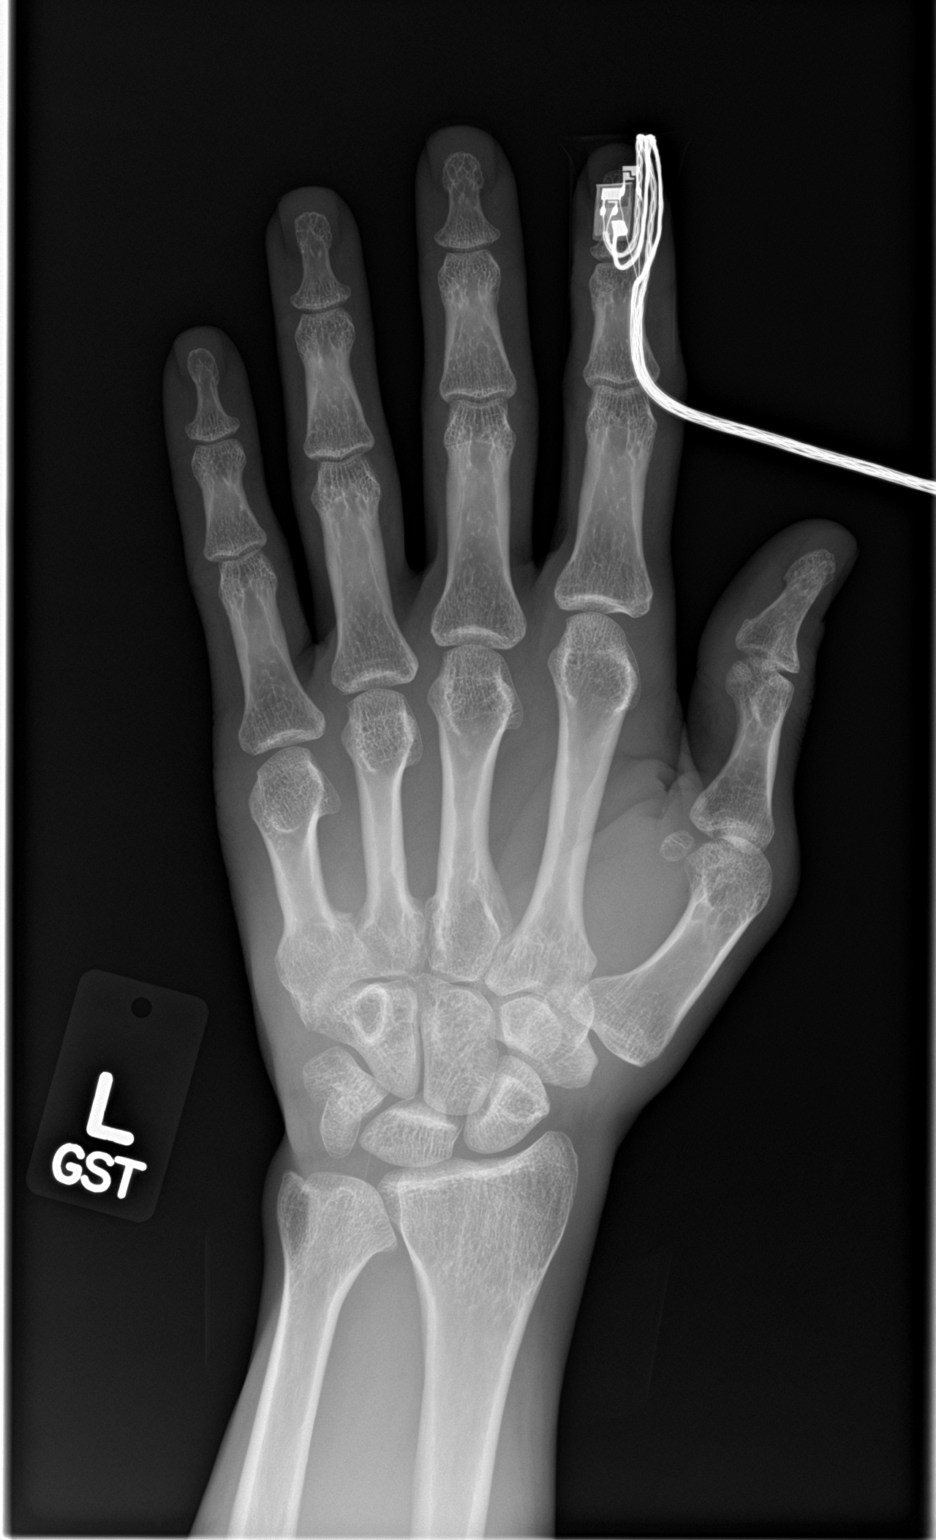

[hand obl]
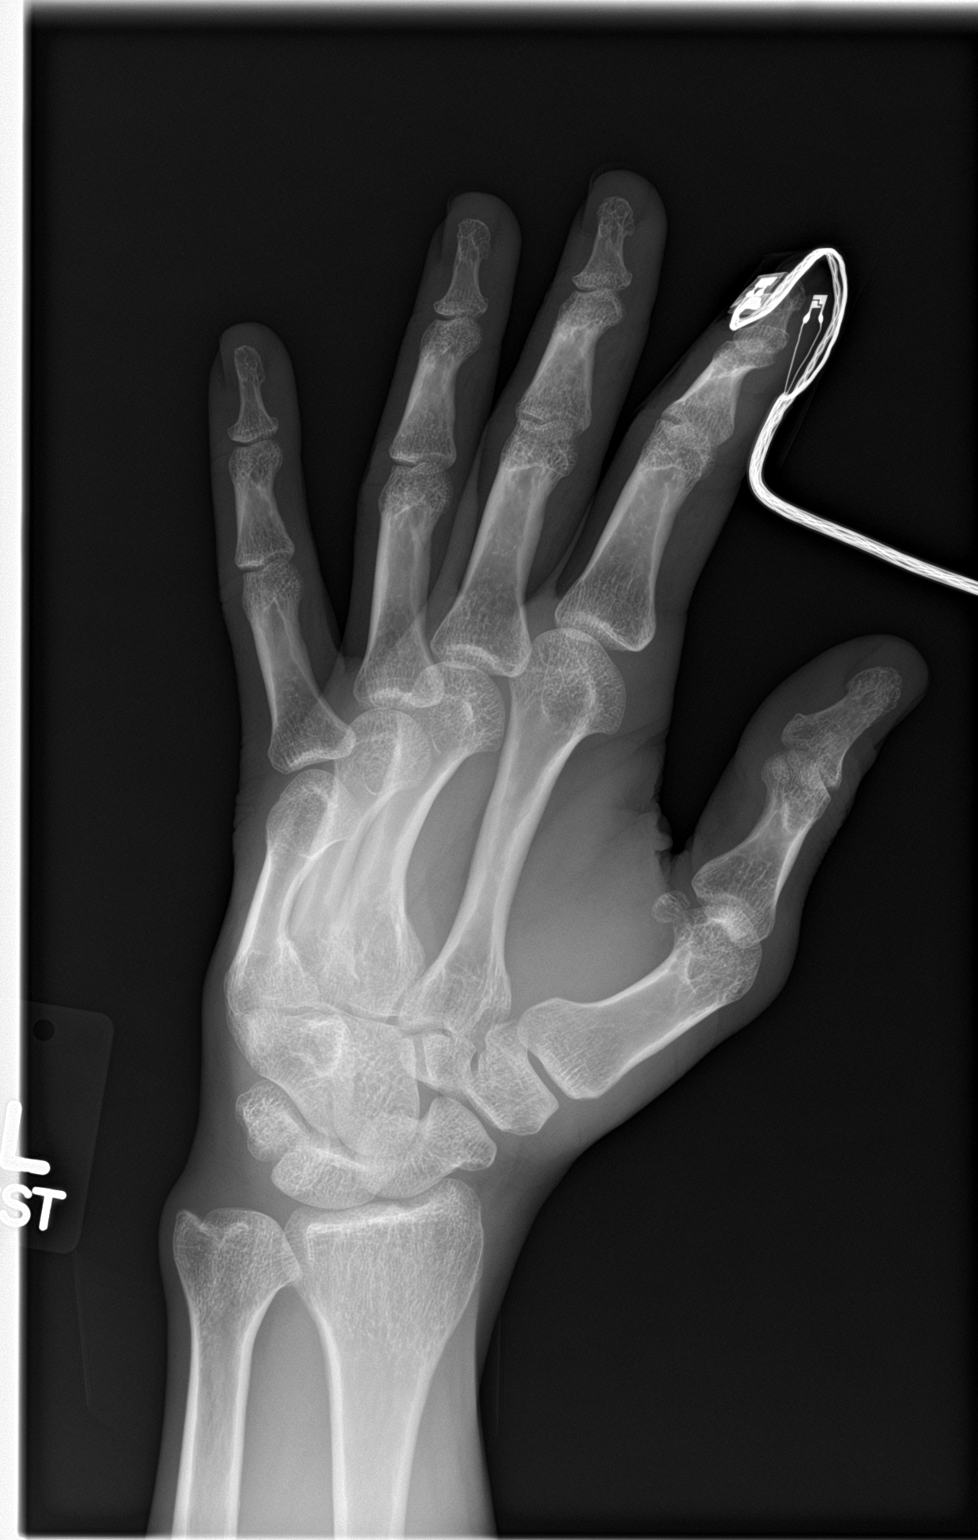

[hand lat (1 of 2)]
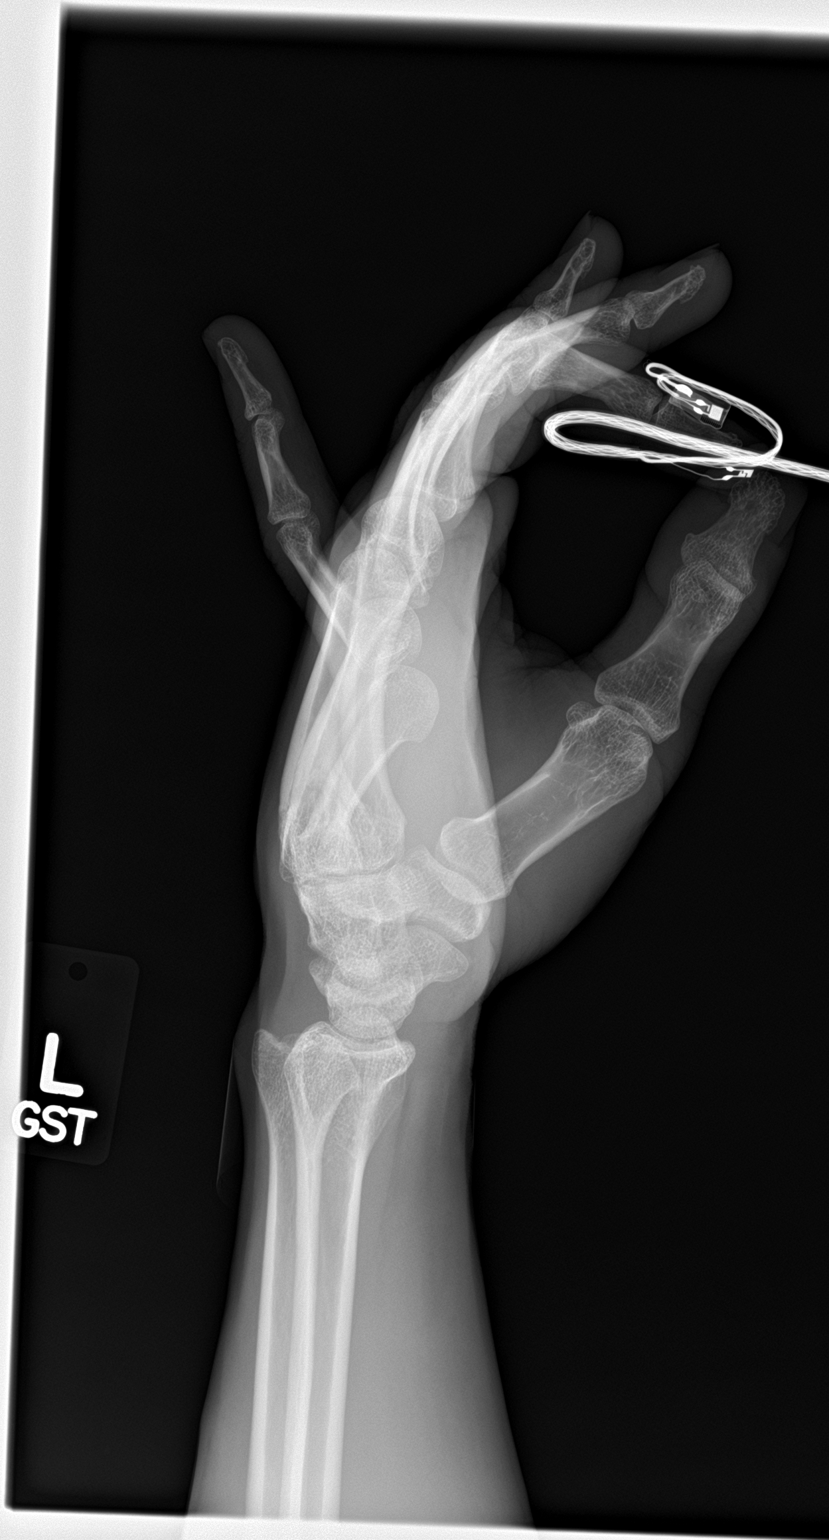

[hand lat (2 of 2)]
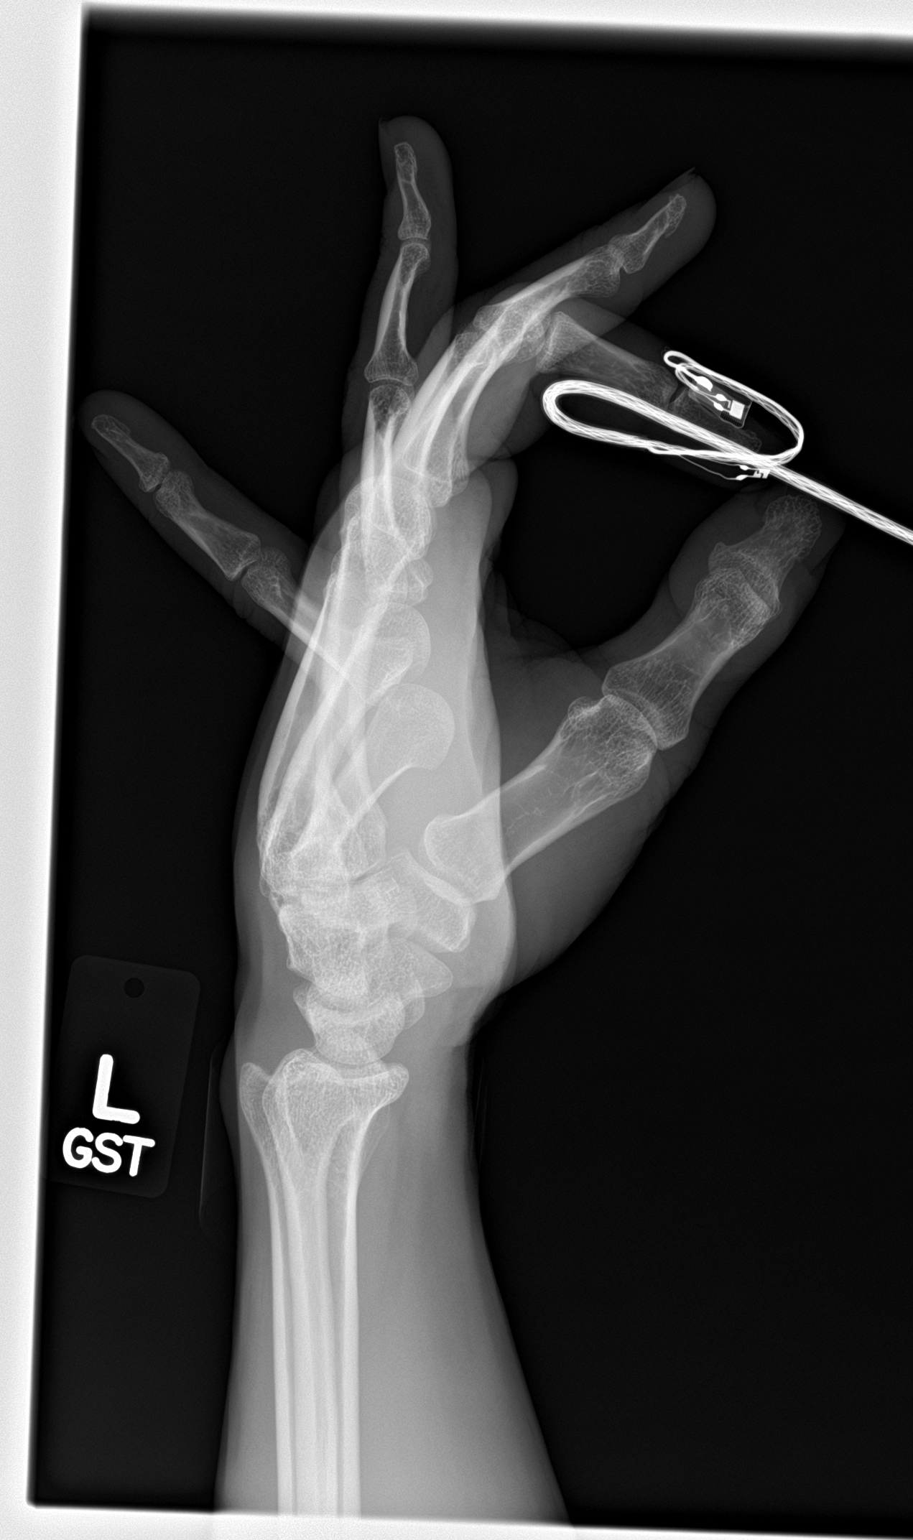

[4 of 4 positions shown; findings below may reference images not displayed]

FINDINGS: There is no evidence of fracture or dislocation. There is no
evidence of arthropathy or other focal bone abnormality. Soft
tissues are unremarkable.
IMPRESSION: Negative.

## 2020-03-23 IMAGING — MR MR HEAD W/O CM
10 of 11 series · 42 of 48 positions shown · non-contrast
Comparison: None.

CLINICAL DATA: Slammed onto RIGHT neck at wrestling match. Headache
and RIGHT neck pain, RIGHT arm paresthesias.

EXAM:
MRI HEAD WITHOUT CONTRAST
MRI CERVICAL SPINE WITHOUT CONTRAST
TECHNIQUE: Multiplanar, multiecho pulse sequences of the brain and surrounding
structures, and cervical spine, to include the craniocervical
junction and cervicothoracic junction, were obtained without
intravenous contrast.

[Series 5: ax dwi_tracew · axial · 3.0mm · 1.50mm/px · z∈[-23,+117]mm · 8 of 80 slices shown]
[im 1/80]
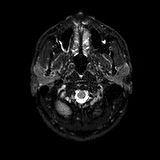
[im 12/80]
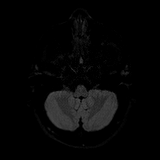
[im 23/80]
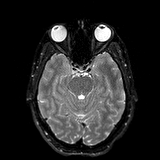
[im 34/80]
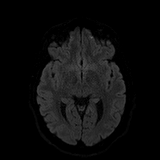
[im 46/80]
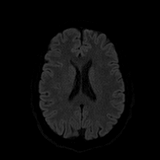
[im 57/80]
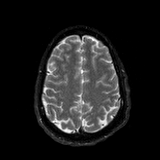
[im 68/80]
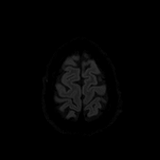
[im 80/80]
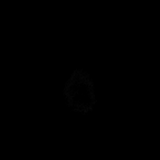

[Series 6: ax dwi_adc · axial · 3.0mm · 1.50mm/px · z∈[-23,+117]mm · 4 of 40 slices shown]
[im 1/40]
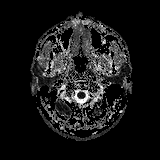
[im 14/40]
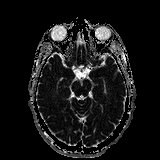
[im 27/40]
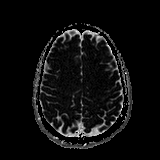
[im 40/40]
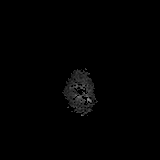

[Series 7: cor dwi_tracew · coronal · 5.0mm · 1.44mm/px · 7 of 72 slices shown]
[im 1/72]
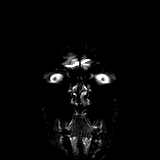
[im 12/72]
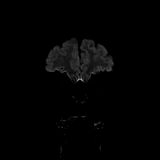
[im 24/72]
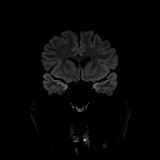
[im 36/72]
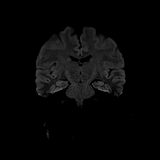
[im 48/72]
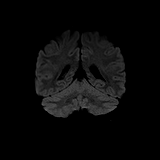
[im 60/72]
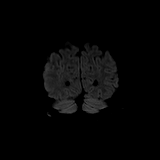
[im 72/72]
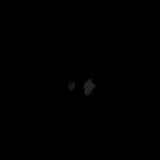

[Series 8: cor dwi_adc · coronal · 5.0mm · 1.44mm/px · 3 of 36 slices shown]
[im 1/36]
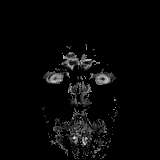
[im 18/36]
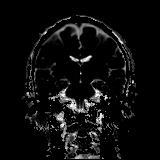
[im 36/36]
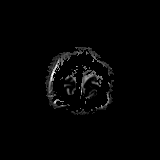

[Series 9: T1 · sagittal · 5.0mm · 0.75mm/px · 2 of 23 slices shown]
[im 1/23]
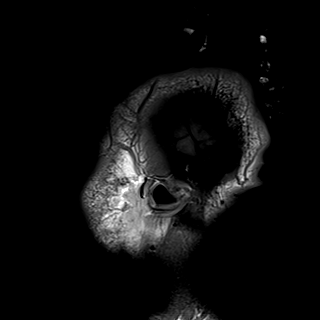
[im 23/23]
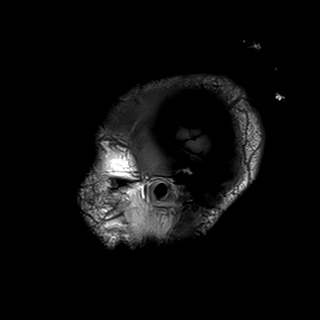

[Series 10: T2 · axial · 5.0mm · 0.69mm/px · z∈[-24,+120]mm · 2 of 25 slices shown (1 of 2)]
[im 1/25]
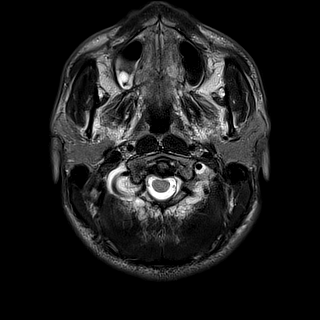
[im 25/25]
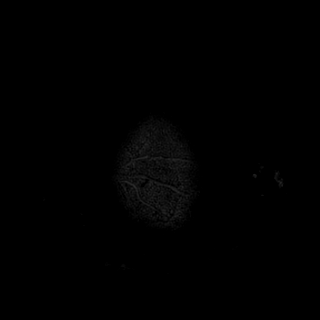

[Series 11: FLAIR · axial · 5.0mm · 0.43mm/px · z∈[-24,+119]mm · 2 of 25 slices shown]
[im 1/25]
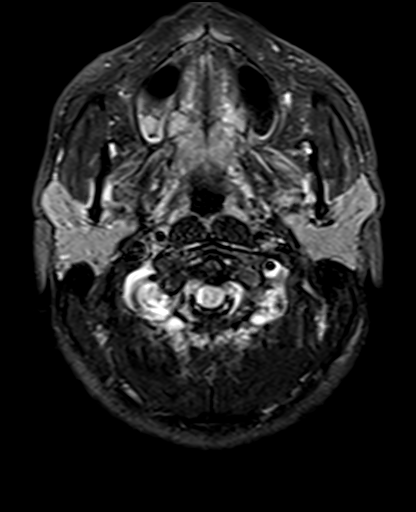
[im 25/25]
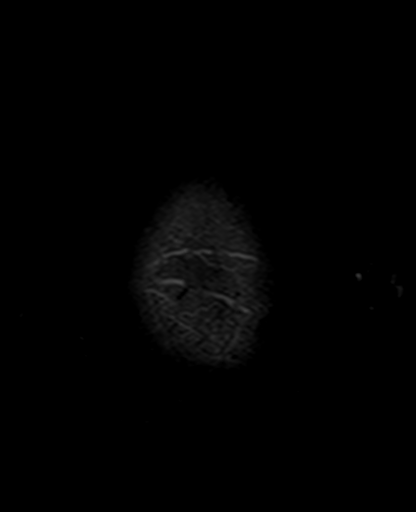

[Series 12: swi_images · axial · 3.0mm · 0.86mm/px · z∈[-44,+133]mm · 6 of 60 slices shown]
[im 1/60]
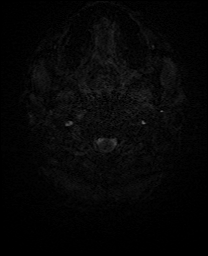
[im 12/60]
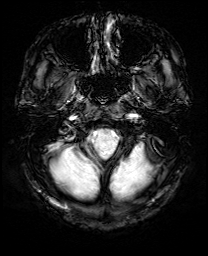
[im 24/60]
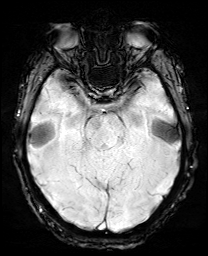
[im 36/60]
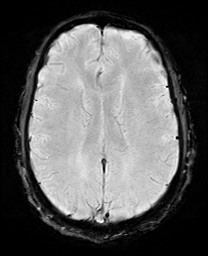
[im 48/60]
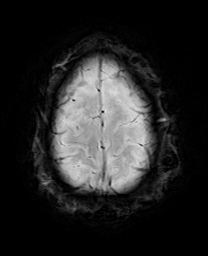
[im 60/60]
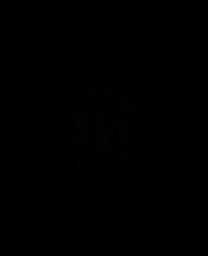

[Series 13: mip_images(sw) · axial · 24.0mm · 0.86mm/px · z∈[-34,+122]mm · 5 of 53 slices shown]
[im 1/53]
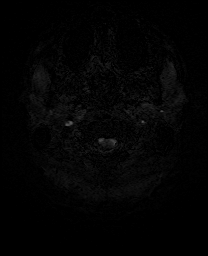
[im 14/53]
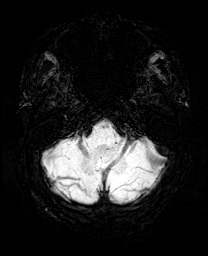
[im 27/53]
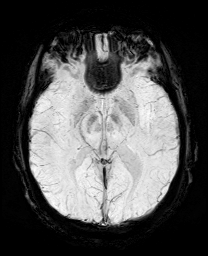
[im 40/53]
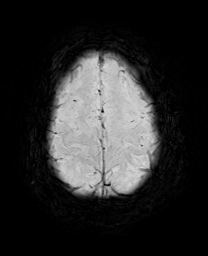
[im 53/53]
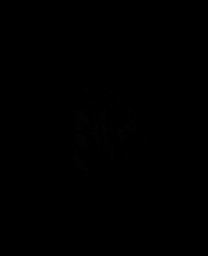

[Series 15: T2 · coronal · 5.0mm · 0.34mm/px · 3 of 30 slices shown (2 of 2)]
[im 1/30]
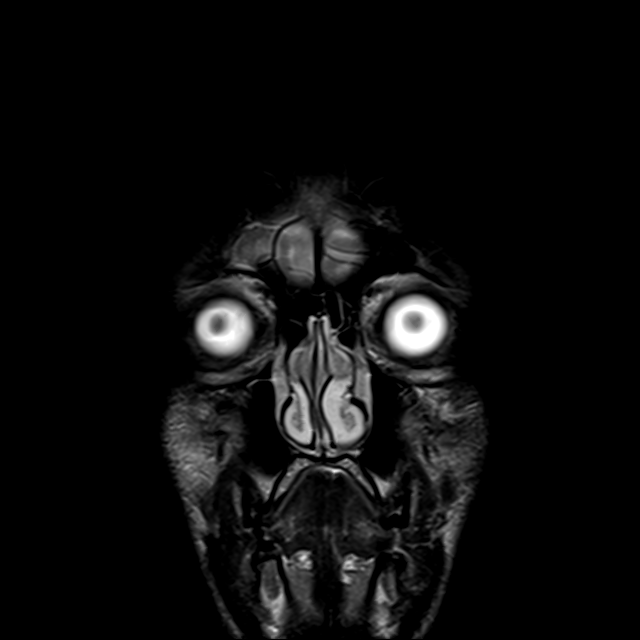
[im 15/30]
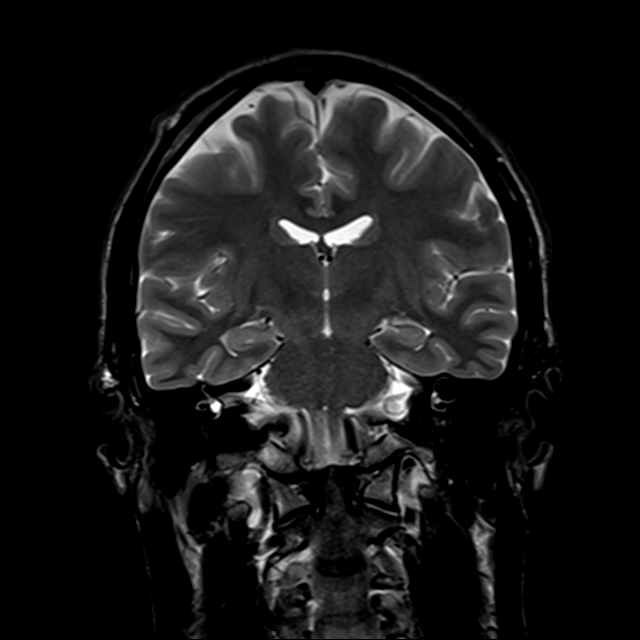
[im 30/30]
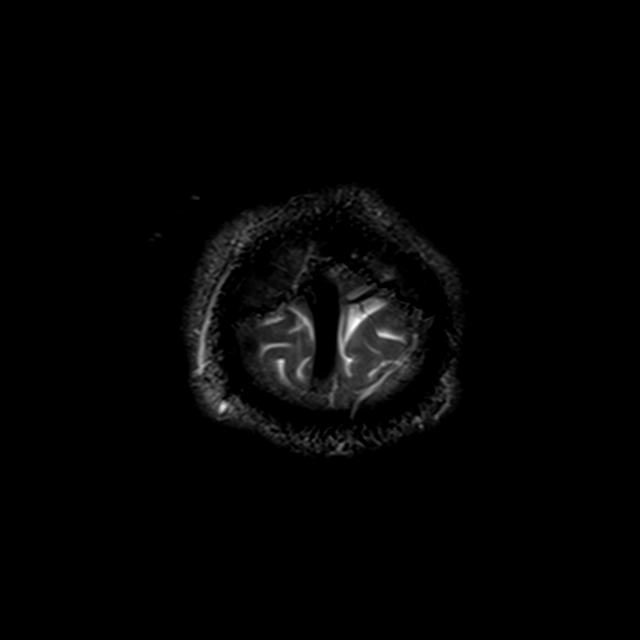

[42 of 48 positions shown; findings below may reference images not displayed]

FINDINGS: MRI HEAD FINDINGS

INTRACRANIAL CONTENTS: No reduced diffusion to suggest acute
ischemia or hyperacute demyelination. No susceptibility artifact to
suggest hemorrhage. The ventricles and sulci are normal for
patient's age. No suspicious parenchymal signal, masses, mass
effect. No abnormal extra-axial fluid collections. No extra-axial
masses.

VASCULAR: Normal major intracranial vascular flow voids present at
skull base.

SKULL AND UPPER CERVICAL SPINE: No abnormal sellar expansion. No
suspicious calvarial bone marrow signal. Craniocervical junction
maintained.

SINUSES/ORBITS: Trace paranasal sinus mucosal thickening. Mastoid
air cells are well aerated.The included ocular globes and orbital
contents are non-suspicious.

OTHER: None.

MRI CERVICAL SPINE FINDINGS

ALIGNMENT: Straightened cervical lordosis.  No malalignment.

VERTEBRAE/DISCS: Vertebral bodies are intact. Intervertebral disc
morphology's and signal are normal.

CORD:Cervical spinal cord is normal morphology and signal
characteristics from the cervicomedullary junction to level of T2-3,
the most caudal well visualized level.

POSTERIOR FOSSA, VERTEBRAL ARTERIES, PARASPINAL TISSUES: No MR
findings of ligamentous injury. Vertebral artery flow voids present.
Included posterior fossa and paraspinal soft tissues are normal.

DISC LEVELS:

C2-3 through C7-T1: No disc bulge, canal stenosis nor neural
foraminal narrowing.
IMPRESSION: 1. Normal noncontrast MRI head.
2. Normal noncontrast MRI cervical spine.

## 2020-03-23 IMAGING — DX DG ORTHOPANTOGRAM /PANORAMIC
1 series · 1 of 1 positions shown · non-contrast
Comparison: None.

CLINICAL DATA: RIGHT jaw pain, wrestling injury.

EXAM:
ORTHOPANTOGRAM/PANORAMIC

[view not recorded]
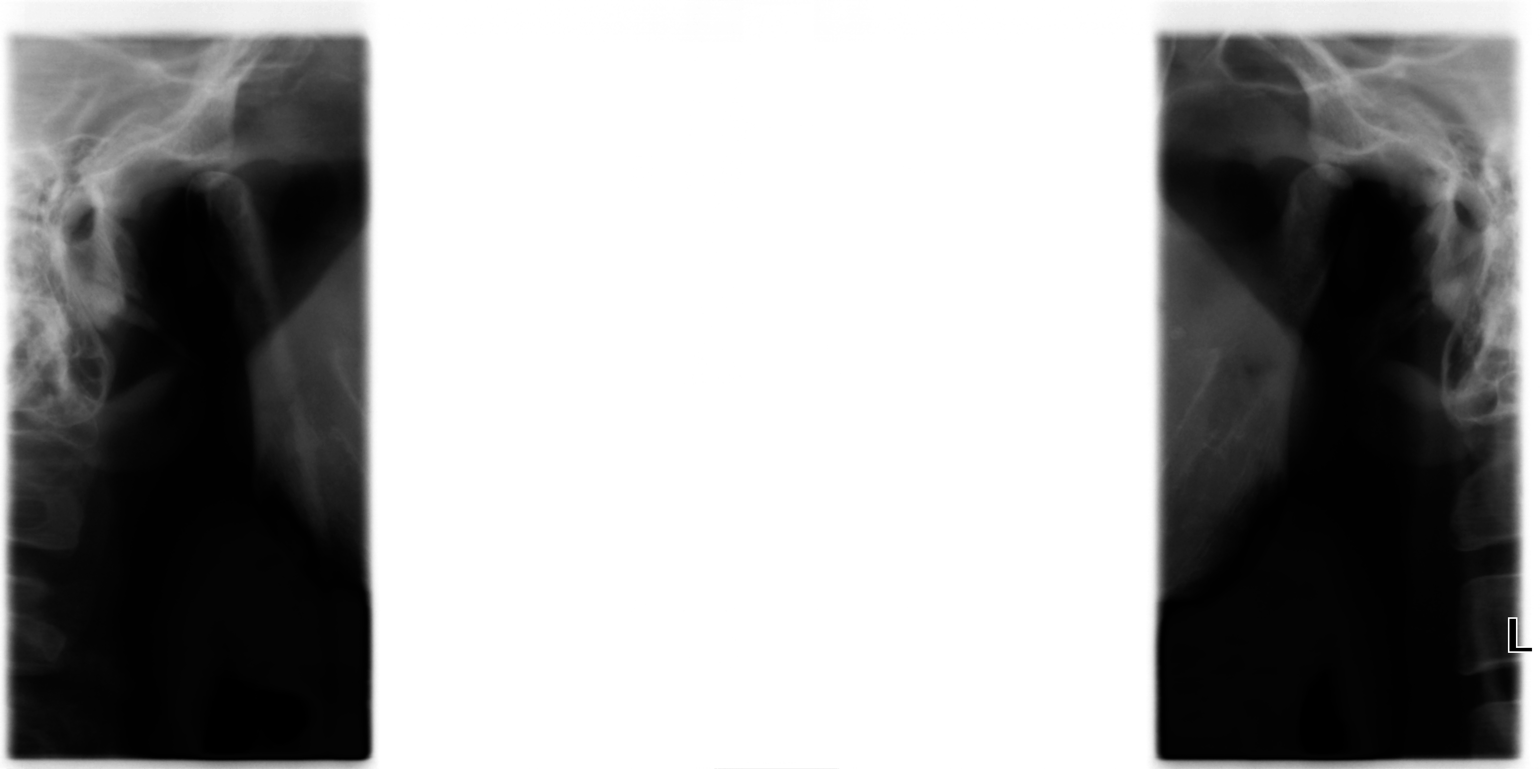

[1 of 1 positions shown; findings below may reference images not displayed]

FINDINGS: Open and close mouth Panorex. Bilateral mandible condyles translate
to the articular eminence, projecting within glenoid fossa on closed
view. Condyles are intact.
IMPRESSION: Negative TMJ radiographs.

## 2020-10-22 ENCOUNTER — Other Ambulatory Visit: Payer: Self-pay

## 2020-10-22 ENCOUNTER — Ambulatory Visit (INDEPENDENT_AMBULATORY_CARE_PROVIDER_SITE_OTHER): Payer: Medicaid Other | Admitting: Endocrinology

## 2020-10-22 VITALS — BP 114/82 | HR 89 | Ht 66.0 in | Wt 153.4 lb

## 2020-10-22 DIAGNOSIS — Z23 Encounter for immunization: Secondary | ICD-10-CM

## 2020-10-22 DIAGNOSIS — E1021 Type 1 diabetes mellitus with diabetic nephropathy: Secondary | ICD-10-CM

## 2020-10-22 LAB — POCT GLYCOSYLATED HEMOGLOBIN (HGB A1C): Hemoglobin A1C: 13.4 % — AB (ref 4.0–5.6)

## 2020-10-22 MED ORDER — BASAGLAR KWIKPEN 100 UNIT/ML ~~LOC~~ SOPN: 20.0000 [IU] | PEN_INJECTOR | SUBCUTANEOUS | 3 refills | Status: DC

## 2020-10-22 MED ORDER — DEXCOM G6 TRANSMITTER MISC: 1.0000 | Freq: Once | 1 refills | Status: AC

## 2020-10-22 MED ORDER — DEXCOM G6 SENSOR MISC: 1.0000 | 3 refills | Status: AC

## 2020-10-22 NOTE — Patient Instructions (Addendum)
I have sent 2 prescriptions to your pharmacy, to change the insulin to Basaglar, 20 units reach morning, and for continuous glucose monitor sensors.   On this type of insulin schedule, you should eat meals on a regular schedule.  If a meal is missed or significantly delayed, your blood sugar could go low.    check your blood sugar 4 times a day.  vary the time of day when you check, between before the 3 meals, and at bedtime.  also check if you have symptoms of your blood sugar being too high or too low.  please keep a record of the readings and bring it to your next appointment here.  You can write it on any piece of paper.  please call us sooner if your blood sugar goes below 70, or if you have a lot of readings over 200.   Please come back for a follow-up appointment in 2 months.

## 2020-10-22 NOTE — Progress Notes (Signed)
Subjective:    Patient ID: Tyler [name], male    DOB: 08-11-1985, 35 y.o.   MRN: [id number]  HPI Pt returns for f/u of diabetes mellitus:  DM type: 1.  Dx'ed: 2010 Complications: DN Therapy: insulin since soon after dx.  DKA: never Severe hypoglycemia: once, 2020. Pancreatitis: never.  Other: he takes BID insulin, after poor results with multiple daily injections.  SDOH: pt says medicaid does not pay for meds, and he cannot afford; he works 3rd shift Interval history: Pt says he was homeless x 3 months, and could not buy the insulin.  Prior to that, he was on pump rx for s brief time.  He stopped, due to cost of supplies.  He now works 1st shift.  He does not check cbg's.  He hs a dexcom receiver.   Past Medical History:  Diagnosis Date   Anxiety    Depression    Diabetic peripheral neuropathy (HCC)    Hematemesis/vomiting blood admissions 09/2013, 01/02/2014   Proteinuria    PTSD (post-traumatic stress disorder)    Stomach ulcer    Type I diabetes mellitus (HCC)     Past Surgical History:  Procedure Laterality Date   APPENDECTOMY     BIOPSY  03/12/2018   Procedure: BIOPSY;  Surgeon: Meryl Dare, MD;  Location: [facility] ENDOSCOPY;  Service: Endoscopy;;   ESOPHAGOGASTRODUODENOSCOPY N/A 01/03/2014   Procedure: ESOPHAGOGASTRODUODENOSCOPY (EGD);  Surgeon: Barrie Folk, MD;  Location: [facility] ENDOSCOPY;  Service: Endoscopy;  Laterality: N/A;   ESOPHAGOGASTRODUODENOSCOPY N/A 08/27/2016   Procedure: EGD with possible interventions;  Surgeon: Napoleon Form, MD;  Location: [facility] ENDOSCOPY;  Service: Endoscopy;  Laterality: N/A;   ESOPHAGOGASTRODUODENOSCOPY (EGD) WITH PROPOFOL N/A 03/12/2018   Procedure: ESOPHAGOGASTRODUODENOSCOPY (EGD) WITH PROPOFOL;  Surgeon: Meryl Dare, MD;  Location: [facility] ENDOSCOPY;  Service: Endoscopy;  Laterality: N/A;   HOT HEMOSTASIS N/A 03/12/2018   Procedure: HOT HEMOSTASIS (ARGON PLASMA COAGULATION/BICAP);  Surgeon: Meryl Dare, MD;  Location: [facility]  ENDOSCOPY;  Service: Endoscopy;  Laterality: N/A;    Social History   Socioeconomic History   Marital status: Single    Spouse name: Not on file   Number of children: 1   Years of education: Not on file   Highest education level: Not on file  Occupational History   Occupation: warehouse work  Tobacco Use   Smoking status: Never   Smokeless tobacco: Never  Vaping Use   Vaping Use: Never used  Substance and Sexual Activity   Alcohol use: No   Drug use: No   Sexual activity: Yes  Other Topics Concern   Not on file  Social History Narrative   Not on file   Social Determinants of Health   Financial Resource Strain: Not on file  Food Insecurity: Not on file  Transportation Needs: Not on file  Physical Activity: Not on file  Stress: Not on file  Social Connections: Not on file  Intimate Partner Violence: Not on file    Current Outpatient Medications on File Prior to Visit  Medication Sig Dispense Refill   acetaminophen (TYLENOL) 325 MG tablet Take 2 tablets (650 mg total) by mouth every 6 (six) hours as needed for mild pain (or Fever >/= 101). 30 tablet 0   cetirizine (ZYRTEC) 10 MG tablet TAKE 1 TABLET BY MOUTH EVERYDAY AT BEDTIME 90 tablet 1   metoCLOPramide (REGLAN) 5 MG tablet Take 1 tablet (5 mg total) by mouth 3 (three) times daily before meals. 90 tablet 0  pantoprazole (PROTONIX) 40 MG tablet Take 40 mg by mouth two times a day 60 tablet 0   sildenafil (VIAGRA) 100 MG tablet Take 1 tablet (100 mg total) by mouth daily as needed for erectile dysfunction. 10 tablet 11   No current facility-administered medications on file prior to visit.    Allergies  Allergen Reactions   Banana Anaphylaxis   Food Itching and Swelling    Walnuts- THROAT ITCHES AND EYES SWELL   Ibuprofen Other (See Comments)    ULCERS   Nsaids Other (See Comments)    ULCERS     Family History  Problem Relation Age of Onset   HIV/AIDS Father    Drug abuse Father    Drug abuse Sister     Diabetes Cousin    Suicidality Neg Hx    Bipolar disorder Neg Hx    Depression Neg Hx    Anxiety disorder Neg Hx     BP 114/82 (BP Location: Right Arm, Patient Position: Sitting, Cuff Size: Normal)   Pulse 89   Ht 5\' 6"  (1.676 m)   Wt 153 lb 6.4 oz (69.6 kg)   SpO2 97%   BMI 24.76 kg/m   Review of Systems     Objective:   Physical Exam Pulses: dorsalis pedis intact bilat.   MSK: no deformity of the feet CV: no leg edema Skin:  no ulcer on the feet.  normal color and temp on the feet. Neuro: sensation is intact to touch on the feet.     Lab Results  Component Value Date   HGBA1C 13.4 (A) 10/22/2020      Assessment & Plan:  Type 1 DM: severe exacerbation Noncompliance with insulin.  As improvement in this could cause drastic reduction in glucose, he needs continuous glucose monitoring  Patient Instructions  I have sent 2 prescriptions to your pharmacy, to change the insulin to Basaglar, 20 units reach morning, and for continuous glucose monitor sensors.   On this type of insulin schedule, you should eat meals on a regular schedule.  If a meal is missed or significantly delayed, your blood sugar could go low.    check your blood sugar 4 times a day.  vary the time of day when you check, between before the 3 meals, and at bedtime.  also check if you have symptoms of your blood sugar being too high or too low.  please keep a record of the readings and bring it to your next appointment here.  You can write it on any piece of paper.  please call 10/24/2020 sooner if your blood sugar goes below 70, or if you have a lot of readings over 200.   Please come back for a follow-up appointment in 2 months.

## 2020-10-23 ENCOUNTER — Telehealth: Payer: Self-pay | Admitting: Endocrinology

## 2020-10-23 NOTE — Telephone Encounter (Signed)
Pt Dexcom & Insulin Glargine (BASAGLAR KWIKPEN) needs PA to [id number] - [place], [place] - [address] Pt also needs refill of Gabapentin. Pt contact [phone]

## 2020-10-26 ENCOUNTER — Other Ambulatory Visit (HOSPITAL_COMMUNITY): Payer: Self-pay

## 2020-10-27 ENCOUNTER — Other Ambulatory Visit (HOSPITAL_COMMUNITY): Payer: Self-pay

## 2020-10-27 ENCOUNTER — Telehealth: Payer: Self-pay | Admitting: Pharmacy Technician

## 2020-10-27 NOTE — Telephone Encounter (Signed)
Patient Advocate Encounter   Received notification from CoverMyMeds that prior authorization for Dexcom is required.   PA submitted on 10/27/20 Key [id number] Status is pending    [facility] will continue to follow:   Sherilyn Dacosta, CPhT Patient Advocate [facility] Phone: [phone] Fax:  [phone]

## 2020-10-29 ENCOUNTER — Other Ambulatory Visit (HOSPITAL_COMMUNITY): Payer: Self-pay

## 2020-10-29 NOTE — Telephone Encounter (Signed)
[facility] Patient Advocate Encounter  Prior Authorization for Dexcom has been approved.    PA# [id number] Effective dates: 10/29/20 through 10/28/21  Patients co-pay is $69.50 for sensors (3/30 days).   $75 for transmitter (1/90days)  [facility]. Spoke with pt to make him aware and to tell him about the manufacturer's coupon.   Sherilyn Dacosta, CPhT Patient Advocate [facility] Phone: [phone] Fax:  [phone]

## 2020-10-29 NOTE — Telephone Encounter (Signed)
Patient Advocate Encounter   Received a letter from the ins company requesting more information. Called to give that information. The lady I spoke with said it was likely we would hear something in 24 hours.  Will continue to follow.

## 2020-10-30 ENCOUNTER — Telehealth: Payer: Self-pay | Admitting: Pharmacy Technician

## 2020-10-30 NOTE — Telephone Encounter (Signed)
Patient Advocate Encounter   Received notification from [employer] that prior authorization for [facility] Novamed G6 SENSOR is required.   PA submitted on 10/30/20 Key [id number] Status is pending    [facility] will continue to follow   Montez Morita, CPhT Patient Advocate [facility] Phone: [phone] Fax:  [phone]

## 2020-10-30 NOTE — Telephone Encounter (Signed)
Appointment scheduled.

## 2020-11-17 ENCOUNTER — Ambulatory Visit: Payer: Self-pay | Admitting: Family Medicine

## 2020-12-22 ENCOUNTER — Ambulatory Visit: Payer: Self-pay | Admitting: Endocrinology

## 2021-01-22 ENCOUNTER — Telehealth: Payer: Self-pay | Admitting: Endocrinology

## 2021-01-22 DIAGNOSIS — E1021 Type 1 diabetes mellitus with diabetic nephropathy: Secondary | ICD-10-CM

## 2021-01-22 NOTE — Telephone Encounter (Signed)
MEDICATION: Insulin Glargine (BASAGLAR KWIKPEN) 100 UNIT/ML   SEE COMMENTS  PHARMACY:   [id number] [place], [place] - [address] Phone:  [phone]  Fax:  [phone]      HAS THE PATIENT CONTACTED THEIR PHARMACY?  YES  IS THIS A 90 DAY SUPPLY : YES  IS PATIENT OUT OF MEDICATION: YES  IF NOT; HOW MUCH IS LEFT:   LAST APPOINTMENT DATE: @11 /29/2022  NEXT APPOINTMENT DATE:@Visit  date not found  DO WE HAVE YOUR PERMISSION TO LEAVE A DETAILED MESSAGE?:  OTHER COMMENTS: Prescription for 12-12-1975 would not cover and pharmacy provided [facility] to patient. Please forward correct prescription to pharmacy for [facility]. This medication is not shown on patient.   **Let patient know to contact pharmacy at the end of the day to make sure medication is ready. **  ** Please notify patient to allow 48-72 hours to process**  **Encourage patient to contact the pharmacy for refills or they can request refills through [facility]**

## 2021-01-26 MED ORDER — BASAGLAR KWIKPEN 100 UNIT/ML ~~LOC~~ SOPN: 20.0000 [IU] | PEN_INJECTOR | SUBCUTANEOUS | 3 refills | Status: AC

## 2021-01-26 NOTE — Telephone Encounter (Signed)
Script sent  

## 2021-01-28 ENCOUNTER — Other Ambulatory Visit: Payer: Self-pay | Admitting: Endocrinology
